# Patient Record
Sex: Female | Born: 1965
Health system: Southern US, Community
[De-identification: ages and names within clinical notes are randomized; demographics above are authoritative.]

## PROBLEM LIST (undated history)

## (undated) DIAGNOSIS — F32A Depression, unspecified: Secondary | ICD-10-CM

## (undated) DIAGNOSIS — L920 Granuloma annulare: Secondary | ICD-10-CM

## (undated) DIAGNOSIS — E119 Type 2 diabetes mellitus without complications: Secondary | ICD-10-CM

## (undated) DIAGNOSIS — F419 Anxiety disorder, unspecified: Secondary | ICD-10-CM

## (undated) DIAGNOSIS — M1990 Unspecified osteoarthritis, unspecified site: Secondary | ICD-10-CM

## (undated) DIAGNOSIS — N189 Chronic kidney disease, unspecified: Secondary | ICD-10-CM

## (undated) DIAGNOSIS — E785 Hyperlipidemia, unspecified: Secondary | ICD-10-CM

## (undated) HISTORY — DX: Hyperlipidemia, unspecified: E78.5

## (undated) HISTORY — DX: Depression, unspecified: F32.A

## (undated) HISTORY — DX: Chronic kidney disease, unspecified: N18.9

## (undated) HISTORY — PX: APPENDECTOMY: SHX54

## (undated) HISTORY — DX: Type 2 diabetes mellitus without complications: E11.9

## (undated) HISTORY — DX: Unspecified osteoarthritis, unspecified site: M19.90

## (undated) HISTORY — DX: Anxiety disorder, unspecified: F41.9

---

## 1999-07-02 ENCOUNTER — Encounter: Admission: RE | Admit: 1999-07-02 | Discharge: 1999-07-02 | Payer: Self-pay | Admitting: Cardiology

## 1999-07-02 ENCOUNTER — Encounter: Payer: Self-pay | Admitting: Cardiology

## 2003-03-18 ENCOUNTER — Ambulatory Visit: Admission: RE | Admit: 2003-03-18 | Discharge: 2003-03-18 | Payer: Self-pay | Admitting: Emergency Medicine

## 2014-09-28 ENCOUNTER — Other Ambulatory Visit (HOSPITAL_BASED_OUTPATIENT_CLINIC_OR_DEPARTMENT_OTHER): Payer: Self-pay | Admitting: Family Medicine

## 2014-09-28 ENCOUNTER — Ambulatory Visit (HOSPITAL_BASED_OUTPATIENT_CLINIC_OR_DEPARTMENT_OTHER): Payer: Self-pay

## 2014-09-28 DIAGNOSIS — Z09 Encounter for follow-up examination after completed treatment for conditions other than malignant neoplasm: Secondary | ICD-10-CM

## 2014-10-03 ENCOUNTER — Ambulatory Visit (HOSPITAL_BASED_OUTPATIENT_CLINIC_OR_DEPARTMENT_OTHER)
Admission: RE | Admit: 2014-10-03 | Discharge: 2014-10-03 | Disposition: A | Discharge status: Home / Self Care | Payer: Commercial Managed Care - PPO | Source: Ambulatory Visit | Attending: Family Medicine | Admitting: Family Medicine

## 2014-10-03 DIAGNOSIS — J188 Other pneumonia, unspecified organism: Secondary | ICD-10-CM | POA: Diagnosis present

## 2014-10-03 DIAGNOSIS — Z09 Encounter for follow-up examination after completed treatment for conditions other than malignant neoplasm: Secondary | ICD-10-CM

## 2014-10-15 ENCOUNTER — Emergency Department (HOSPITAL_BASED_OUTPATIENT_CLINIC_OR_DEPARTMENT_OTHER): Payer: Commercial Managed Care - PPO

## 2014-10-15 ENCOUNTER — Emergency Department (HOSPITAL_BASED_OUTPATIENT_CLINIC_OR_DEPARTMENT_OTHER)
Admission: EM | Admit: 2014-10-15 | Discharge: 2014-10-15 | Disposition: A | Discharge status: Home / Self Care | Payer: Commercial Managed Care - PPO | Attending: Emergency Medicine | Admitting: Emergency Medicine

## 2014-10-15 ENCOUNTER — Encounter (HOSPITAL_BASED_OUTPATIENT_CLINIC_OR_DEPARTMENT_OTHER): Payer: Self-pay | Admitting: Emergency Medicine

## 2014-10-15 DIAGNOSIS — Z3202 Encounter for pregnancy test, result negative: Secondary | ICD-10-CM | POA: Diagnosis not present

## 2014-10-15 DIAGNOSIS — R110 Nausea: Secondary | ICD-10-CM | POA: Insufficient documentation

## 2014-10-15 DIAGNOSIS — Z720 Tobacco use: Secondary | ICD-10-CM | POA: Insufficient documentation

## 2014-10-15 DIAGNOSIS — R079 Chest pain, unspecified: Secondary | ICD-10-CM | POA: Diagnosis present

## 2014-10-15 DIAGNOSIS — Z9049 Acquired absence of other specified parts of digestive tract: Secondary | ICD-10-CM | POA: Insufficient documentation

## 2014-10-15 DIAGNOSIS — R197 Diarrhea, unspecified: Secondary | ICD-10-CM | POA: Diagnosis not present

## 2014-10-15 DIAGNOSIS — R0602 Shortness of breath: Secondary | ICD-10-CM | POA: Insufficient documentation

## 2014-10-15 DIAGNOSIS — R42 Dizziness and giddiness: Secondary | ICD-10-CM | POA: Insufficient documentation

## 2014-10-15 LAB — CBC
HEMATOCRIT: 36.2 % (ref 36.0–46.0)
Hemoglobin: 12.3 g/dL (ref 12.0–15.0)
MCH: 30.8 pg (ref 26.0–34.0)
MCHC: 34 g/dL (ref 30.0–36.0)
MCV: 90.5 fL (ref 78.0–100.0)
Platelets: 248 10*3/uL (ref 150–400)
RBC: 4 MIL/uL (ref 3.87–5.11)
RDW: 13.2 % (ref 11.5–15.5)
WBC: 8.4 10*3/uL (ref 4.0–10.5)

## 2014-10-15 LAB — BASIC METABOLIC PANEL
Anion gap: 6 (ref 5–15)
BUN: 5 mg/dL — ABNORMAL LOW (ref 6–20)
CALCIUM: 8.6 mg/dL — AB (ref 8.9–10.3)
CHLORIDE: 105 mmol/L (ref 101–111)
CO2: 25 mmol/L (ref 22–32)
Creatinine, Ser: 0.84 mg/dL (ref 0.44–1.00)
Glucose, Bld: 155 mg/dL — ABNORMAL HIGH (ref 65–99)
POTASSIUM: 3 mmol/L — AB (ref 3.5–5.1)
SODIUM: 136 mmol/L (ref 135–145)

## 2014-10-15 LAB — TROPONIN I: Troponin I: <0.03 ng/mL (ref <0.031)

## 2014-10-15 LAB — PREGNANCY, URINE: Preg Test, Ur: NEGATIVE

## 2014-10-15 MED ORDER — POTASSIUM CHLORIDE ER 10 MEQ PO TBCR: 10.0000 meq | EXTENDED_RELEASE_TABLET | Freq: Every day | ORAL | Status: DC

## 2014-10-15 MED ORDER — IOHEXOL 350 MG/ML SOLN
100.0000 mL | Freq: Once | INTRAVENOUS | Status: AC | PRN
  Administered 2014-10-15: 100 mL via INTRAVENOUS

## 2014-10-15 MED ORDER — ONDANSETRON HCL 4 MG/2ML IJ SOLN
INTRAMUSCULAR | Status: AC
  Administered 2014-10-15: 4 mg
  Filled 2014-10-15: qty 2

## 2014-10-15 MED ORDER — FENTANYL CITRATE (PF) 100 MCG/2ML IJ SOLN
50.0000 ug | Freq: Once | INTRAMUSCULAR | Status: AC
  Administered 2014-10-15: 50 ug via INTRAVENOUS
  Filled 2014-10-15: qty 2

## 2014-10-15 MED ORDER — AZITHROMYCIN 250 MG PO TABS
500.0000 mg | ORAL_TABLET | Freq: Once | ORAL | Status: AC
  Administered 2014-10-15: 500 mg via ORAL
  Filled 2014-10-15: qty 2

## 2014-10-15 MED ORDER — AZITHROMYCIN 500 MG PO TABS: 500.0000 mg | ORAL_TABLET | Freq: Every day | ORAL | Status: DC

## 2014-10-15 MED ORDER — ONDANSETRON HCL 4 MG PO TABS: 4.0000 mg | ORAL_TABLET | Freq: Three times a day (TID) | ORAL | Status: DC | PRN

## 2014-10-15 MED ORDER — POTASSIUM CHLORIDE CRYS ER 20 MEQ PO TBCR
40.0000 meq | EXTENDED_RELEASE_TABLET | Freq: Once | ORAL | Status: AC
  Administered 2014-10-15: 40 meq via ORAL
  Filled 2014-10-15: qty 2

## 2014-10-15 MED ORDER — HYDROCODONE-ACETAMINOPHEN 5-325 MG PO TABS: 1.0000 | ORAL_TABLET | Freq: Four times a day (QID) | ORAL | Status: DC | PRN

## 2014-10-15 NOTE — ED Notes (Signed)
Pt in c/o chest pressure, SOB, near syncope, and arm pain since this am. Recent hospitalization and pneumonia. A+O and answering questions in triage.

## 2014-10-15 NOTE — ED Provider Notes (Signed)
CSN: [id number]     Arrival date & time 10/15/14  1618 History   First MD Initiated Contact with Patient 10/15/14 1643     Chief Complaint  Patient presents with  . Chest Pain  . Shortness of Breath   Karen Whitehead is a 49 yo F that reports a recent treatment for Legionella PNA. She was completed a course of ABX, steroids and an inhaler. Since last night she began experience left sided chest pain with heart fluttering.  The pain was 7/10 and reported as pressure.  She has never experienced this pain before. The pain is constant and not related to position, food, or exercise.  Denies any injury or trauma.  Is having some nausea, diarrhea and dizziness. No fever, constipation or vomiting.  She has no history of reflux, HTN, DM, hx of surgery or DVT.  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    (Consider location/radiation/quality/duration/timing/severity/associated sxs/prior Treatment) Patient is a 49 y.o. female presenting with chest pain and shortness of breath. The history is provided by the patient.  Chest Pain Pain location:  L chest Pain quality: pressure   Pain radiates to:  Does not radiate Pain severity:  Moderate Timing:  Constant Chronicity:  New Relieved by:  Rest Ineffective treatments:  None tried Associated symptoms: dizziness, nausea and shortness of breath   Associated symptoms: no cough, no fever, no numbness, not vomiting and no weakness   Shortness of Breath Associated symptoms: chest pain   Associated symptoms: no cough, no fever, no rash and no vomiting     History reviewed. No pertinent past medical history. Past Surgical History  Procedure Laterality Date  . Appendectomy     History reviewed. No pertinent family history. History  Substance Use Topics  . Smoking status: Current Every Day Smoker  . Smokeless tobacco: Never Used  . Alcohol Use: No   OB History    No data available     Review of Systems   Constitutional: Negative for fever.  Respiratory: Positive for shortness of breath. Negative for cough.   Cardiovascular: Positive for chest pain.  Gastrointestinal: Positive for nausea and diarrhea. Negative for vomiting and constipation.  Skin: Negative for rash.  Neurological: Positive for dizziness. Negative for weakness and numbness.      Allergies  Review of patient's allergies indicates no known allergies.  Home Medications   Prior to Admission medications   Medication Sig Start Date End Date Taking? Authorizing Provider  azithromycin (ZITHROMAX) 500 MG tablet Take 1 tablet (500 mg total) by mouth daily. 10/15/14   Rosemarie Ax, MD  HYDROcodone-acetaminophen (NORCO) 5-325 MG per tablet Take 1 tablet by mouth every 6 (six) hours as needed for moderate pain. 10/15/14   Rosemarie Ax, MD  ondansetron (ZOFRAN) 4 MG tablet Take 1 tablet (4 mg total) by mouth every 8 (eight) hours as needed for nausea or vomiting. 10/15/14   Rosemarie Ax, MD   BP 125/77 mmHg  Pulse 62  Resp 15  SpO2 98%  LMP 09/19/2014 Physical Exam  Constitutional: She is oriented to person, place, and time. She appears well-developed and well-nourished.  HENT:  Head: Normocephalic and atraumatic.  Eyes: Conjunctivae and EOM are normal.  Neck: Normal range of motion. Neck supple.  Pulmonary/Chest: She exhibits tenderness.  Pain improved when she has her left shoulder in full  abduction   Musculoskeletal: Normal range of motion.  Neurological: She is oriented to person,  place, and time.  Skin: Skin is warm. No rash noted.    ED Course  Procedures (including critical care time) Labs Review Labs Reviewed  BASIC METABOLIC PANEL - Abnormal; Notable for the following:    Potassium 3.0 (*)    Glucose, Bld 155 (*)    BUN 5 (*)    Calcium 8.6 (*)    All other components within normal limits  CBC  TROPONIN I  PREGNANCY, URINE    Imaging Review Dg Chest 2 View  10/15/2014   CLINICAL DATA:   Recurrent left chest pain with difficulty breathing. Pneumonia diagnosed 3 weeks ago and treated with antibiotics. Ex-smoker. Subsequent encounter.  EXAM: CHEST  2 VIEW  COMPARISON:  Chest radiographs 10/03/2014.  FINDINGS: There has been partial but incomplete healing of the previously demonstrated left suprahilar airspace disease. The lower lobe appears clear. There is stable mild volume loss in the left hemithorax. The right lung is clear. The heart size and mediastinal contours are stable without definite adenopathy. There is no pleural effusion.  IMPRESSION: Partial clearing of left upper lobe pneumonia demonstrated 12 days prior. This improvement is reassuring. Followup PA and lateral chest X-ray is recommended in 3-4 weeks following completion of antibiotic therapy to ensure resolution and exclude underlying malignancy.   Electronically Signed   By: Richardean Sale M.D.   On: 10/15/2014 17:12   Ct Angio Chest Pe W/cm &/or Wo Cm  10/15/2014   CLINICAL DATA:  Chest pressure.  Near syncope.  Arm pain.  EXAM: CT ANGIOGRAPHY CHEST WITH CONTRAST  TECHNIQUE: Multidetector CT imaging of the chest was performed using the standard protocol during bolus administration of intravenous contrast. Multiplanar CT image reconstructions and MIPs were obtained to evaluate the vascular anatomy.  CONTRAST:  158mL OMNIPAQUE IOHEXOL 350 MG/ML SOLN  COMPARISON:  10/15/2014, 10/03/2014  FINDINGS: There is adequate opacification of the pulmonary arteries. There is no pulmonary embolus. The main pulmonary artery, right main pulmonary artery and left main pulmonary arteries are normal in size. The heart size is normal. There is no pericardial effusion.  There is left upper lobe airspace disease. There is no centrally obstructing mass. Mild reticular nodular interstitial disease in the posterior left upper lobe. There is mild right middle lobe atelectasis. There is no pleural effusion or pneumothorax.  There is no axillary, hilar, or  mediastinal adenopathy.  There is no lytic or blastic osseous lesion.  The visualized portions of the upper abdomen are unremarkable.  Review of the MIP images confirms the above findings.  IMPRESSION: 1. No evidence of pulmonary embolus. 2. Left upper lobe airspace disease which may reflect atelectasis versus persistent pneumonia. There is no centrally obstructing mass.   Electronically Signed   By: Kathreen Devoid   On: 10/15/2014 19:28     EKG Interpretation   Date/Time:  Sunday October 15 2014 16:28:26 EDT Ventricular Rate:  71 PR Interval:  144 QRS Duration: 82 QT Interval:  398 QTC Calculation: 432 R Axis:   62 Text Interpretation:  Normal sinus rhythm Normal ECG Confirmed by BEATON   MD, ROBERT ([id number]) on 10/15/2014 4:29:35 PM      MDM   Final diagnoses:  Chest pain, unspecified chest pain type   Ms. Mandarino is a 49 yo F p/w with left sided chest pain/pressure. Patient having hypokalemia and will replenish with 40 mEq Kdur. Wells 0 and PERC all no but given her hx of smoking, working as a Administrator and concern for malignancy on her  CXR will CTA.   CTA neg for mass or PE. Chest pain could be associated with PNA. Give azithromycin 500 mg here and another 4 days worth. Give zofran for nausea and norco #8 tablets for pain PRN. Patient agreeable with plan and discharge.   Rosemarie Ax, MD PGY-2, [facility] 10/15/2014, 7:59 PM     Rosemarie Ax, MD 10/15/14 Patrecia Pour  Leonard Schwartz, MD 10/15/14 2004

## 2014-10-15 NOTE — Discharge Instructions (Signed)

## 2016-02-19 ENCOUNTER — Emergency Department (HOSPITAL_COMMUNITY)
Admission: EM | Admit: 2016-02-19 | Discharge: 2016-02-19 | Disposition: A | Discharge status: Home / Self Care | Payer: Commercial Managed Care - PPO | Attending: Emergency Medicine | Admitting: Emergency Medicine

## 2016-02-19 ENCOUNTER — Emergency Department (HOSPITAL_COMMUNITY): Payer: Commercial Managed Care - PPO

## 2016-02-19 ENCOUNTER — Encounter (HOSPITAL_COMMUNITY): Payer: Self-pay | Admitting: Emergency Medicine

## 2016-02-19 DIAGNOSIS — R0789 Other chest pain: Secondary | ICD-10-CM

## 2016-02-19 DIAGNOSIS — F17210 Nicotine dependence, cigarettes, uncomplicated: Secondary | ICD-10-CM | POA: Insufficient documentation

## 2016-02-19 LAB — URINE MICROSCOPIC-ADD ON

## 2016-02-19 LAB — CBC
HCT: 43.4 % (ref 36.0–46.0)
Hemoglobin: 15.1 g/dL — ABNORMAL HIGH (ref 12.0–15.0)
MCH: 30.9 pg (ref 26.0–34.0)
MCHC: 34.8 g/dL (ref 30.0–36.0)
MCV: 88.8 fL (ref 78.0–100.0)
PLATELETS: 306 10*3/uL (ref 150–400)
RBC: 4.89 MIL/uL (ref 3.87–5.11)
RDW: 13 % (ref 11.5–15.5)
WBC: 8 10*3/uL (ref 4.0–10.5)

## 2016-02-19 LAB — BASIC METABOLIC PANEL
Anion gap: 8 (ref 5–15)
BUN: 7 mg/dL (ref 6–20)
CALCIUM: 9.8 mg/dL (ref 8.9–10.3)
CO2: 25 mmol/L (ref 22–32)
CREATININE: 0.87 mg/dL (ref 0.44–1.00)
Chloride: 106 mmol/L (ref 101–111)
GFR calc non Af Amer: >60 mL/min (ref >60)
Glucose, Bld: 111 mg/dL — ABNORMAL HIGH (ref 65–99)
Potassium: 4 mmol/L (ref 3.5–5.1)
SODIUM: 139 mmol/L (ref 135–145)

## 2016-02-19 LAB — I-STAT TROPONIN, ED
TROPONIN I, POC: 0 ng/mL (ref 0.00–0.08)
Troponin i, poc: 0 ng/mL (ref 0.00–0.08)

## 2016-02-19 LAB — URINALYSIS, ROUTINE W REFLEX MICROSCOPIC
BILIRUBIN URINE: NEGATIVE
Glucose, UA: NEGATIVE mg/dL
Ketones, ur: NEGATIVE mg/dL
Leukocytes, UA: NEGATIVE
Nitrite: NEGATIVE
PH: 5.5 (ref 5.0–8.0)
Protein, ur: NEGATIVE mg/dL
Specific Gravity, Urine: 1.014 (ref 1.005–1.030)

## 2016-02-19 NOTE — ED Provider Notes (Signed)
[facility] DEPT Provider Note   CSN: [id number] Arrival date & time: 02/19/16  1021     History   Chief Complaint Chief Complaint  Patient presents with  . Chest Pain    HPI Karen Whitehead is a 50 y.o. female.  The history is provided by the patient. No language interpreter was used.  Chest Pain   This is a recurrent problem. The current episode started more than 1 week ago. The problem occurs every several days. The problem has not changed since onset.The pain is associated with exertion. The pain is present in the lateral region. The quality of the pain is described as exertional and pleuritic. The pain radiates to the mid back. The symptoms are aggravated by exertion. Associated symptoms include back pain, dizziness and shortness of breath. Pertinent negatives include no abdominal pain and no fever. She has tried rest for the symptoms. The treatment provided moderate relief.    History reviewed. No pertinent past medical history.  There are no active problems to display for this patient.   Past Surgical History:  Procedure Laterality Date  . APPENDECTOMY      OB History    No data available       Home Medications    Prior to Admission medications   Medication Sig Start Date End Date Taking? Authorizing Provider  azithromycin (ZITHROMAX) 500 MG tablet Take 1 tablet (500 mg total) by mouth daily. 10/15/14   Rosemarie Ax, MD  HYDROcodone-acetaminophen (NORCO) 5-325 MG per tablet Take 1 tablet by mouth every 6 (six) hours as needed for moderate pain. 10/15/14   Rosemarie Ax, MD  ondansetron (ZOFRAN) 4 MG tablet Take 1 tablet (4 mg total) by mouth every 8 (eight) hours as needed for nausea or vomiting. 10/15/14   Rosemarie Ax, MD  potassium chloride (K-DUR) 10 MEQ tablet Take 1 tablet (10 mEq total) by mouth daily. 10/15/14   Rosemarie Ax, MD    Family History No family history on file.  Social History Social History  Substance Use Topics  .  Smoking status: Current Every Day Smoker    Packs/day: 0.50    Types: Cigarettes  . Smokeless tobacco: Never Used  . Alcohol use No     Allergies   Review of patient's allergies indicates no known allergies.   Review of Systems Review of Systems  Constitutional: Negative for fever.  Respiratory: Positive for shortness of breath.   Cardiovascular: Positive for chest pain. Negative for leg swelling.  Gastrointestinal: Negative for abdominal pain.  Musculoskeletal: Positive for back pain.  Neurological: Positive for dizziness.  All other systems reviewed and are negative.    Physical Exam Updated Vital Signs BP (!) 137/102 (BP Location: Left Arm)   Pulse 88   Temp 97.2 F (36.2 C) (Oral)   Resp 18   Ht 5\' 4"  (1.626 m)   Wt 90.7 kg   LMP 01/18/2016   SpO2 99%   BMI 34.33 kg/m   Physical Exam  Constitutional: She is oriented to person, place, and time. She appears well-developed and well-nourished.  HENT:  Head: Normocephalic.  Eyes: Conjunctivae are normal.  Neck: Neck supple.  Cardiovascular: Normal rate, regular rhythm and intact distal pulses.   Pulmonary/Chest: Effort normal and breath sounds normal.  Abdominal: Soft. Bowel sounds are normal. She exhibits no distension. There is tenderness. There is CVA tenderness.  Musculoskeletal: She exhibits no edema.  Neurological: She is alert and oriented to person, place, and time.  Skin: Skin is warm and dry.  Psychiatric: She has a normal mood and affect.  Nursing note and vitals reviewed.    ED Treatments / Results  Labs (all labs ordered are listed, but only abnormal results are displayed) Labs Reviewed  BASIC METABOLIC PANEL - Abnormal; Notable for the following:       Result Value   Glucose, Bld 111 (*)    All other components within normal limits  CBC - Abnormal; Notable for the following:    Hemoglobin 15.1 (*)    All other components within normal limits  I-STAT TROPOININ, ED    EKG  EKG  Interpretation None       Radiology Dg Chest 2 View  Result Date: 02/19/2016 CLINICAL DATA:  Left chest pain, some dizziness EXAM: CHEST  2 VIEW COMPARISON:  CT angio chest of 10/15/2014 and chest x-ray of the same date FINDINGS: The left upper lobe airspace disease has cleared. No infiltrate or effusion is currently seen. Mediastinal and hilar contours are unremarkable. The heart is within normal limits in size. No bony abnormality is seen. IMPRESSION: Clearing of left upper lobe pneumonia.  No definite active process. Electronically Signed   By: Ivar Drape M.D.   On: 02/19/2016 11:05    Procedures Procedures (including critical care time)  Medications Ordered in ED Medications - No data to display   Initial Impression / Assessment and Plan / ED Course  I have reviewed the triage vital signs and the nursing notes.  Pertinent labs & imaging results that were available during my care of the patient were reviewed by me and considered in my medical decision making (see chart for details).  Clinical Course  Patient with several month history of intermittent chest pain.  Increased pain today while in ED with a family member that passed away. No ischemic changes on ECG. Negative delta troponin.  Discussed with Dr. Alvino Chapel.  Patient will be discharged home with follow-up by PCP. Return precautions discussed.    Final Clinical Impressions(s) / ED Diagnoses   Final diagnoses:  Atypical chest pain    New Prescriptions New Prescriptions   No medications on file     [name], NP 02/19/16 [place], MD 02/21/16 [phone]

## 2016-02-19 NOTE — ED Notes (Signed)
Theodosia Paling NP, spoke with patient about results and and papers reviewed with patient. Denies questions and verbalizes intent to follow up

## 2016-02-19 NOTE — ED Triage Notes (Signed)
Patient was here to see her father as a patient.  Patient states he passed "and I think it has to do with that".   Patient states L chest pain that goes into L arm.   Patient states did feel lightheaded and had some nausea.  Patient denies other symptoms.

## 2016-02-19 NOTE — ED Notes (Signed)
Pt is in consult room with family -- father passed away-- is in rm Trauma C

## 2016-12-19 MED FILL — METAXALONE 800 MG TABLET: 800 | 7 days supply | Qty: 21 | Fill #0

## 2017-12-07 ENCOUNTER — Ambulatory Visit: Payer: Self-pay | Admitting: Family Medicine

## 2018-11-10 ENCOUNTER — Encounter (HOSPITAL_COMMUNITY): Payer: Self-pay

## 2018-11-10 ENCOUNTER — Ambulatory Visit (HOSPITAL_COMMUNITY)
Admission: EM | Admit: 2018-11-10 | Discharge: 2018-11-10 | Disposition: A | Discharge status: Home / Self Care | Payer: Commercial Managed Care - PPO | Attending: Emergency Medicine | Admitting: Emergency Medicine

## 2018-11-10 ENCOUNTER — Other Ambulatory Visit: Payer: Self-pay

## 2018-11-10 DIAGNOSIS — R823 Hemoglobinuria: Secondary | ICD-10-CM

## 2018-11-10 DIAGNOSIS — B354 Tinea corporis: Secondary | ICD-10-CM | POA: Insufficient documentation

## 2018-11-10 LAB — CBC WITH DIFFERENTIAL/PLATELET
Abs Immature Granulocytes: 0.04 10*3/uL (ref 0.00–0.07)
Basophils Absolute: 0.1 10*3/uL (ref 0.0–0.1)
Basophils Relative: 1 %
Eosinophils Absolute: 0.3 10*3/uL (ref 0.0–0.5)
Eosinophils Relative: 3 %
HCT: 44 % (ref 36.0–46.0)
Hemoglobin: 15.1 g/dL — ABNORMAL HIGH (ref 12.0–15.0)
Immature Granulocytes: 0 %
Lymphocytes Relative: 32 %
Lymphs Abs: 3 10*3/uL (ref 0.7–4.0)
MCH: 31.1 pg (ref 26.0–34.0)
MCHC: 34.3 g/dL (ref 30.0–36.0)
MCV: 90.7 fL (ref 80.0–100.0)
Monocytes Absolute: 0.6 10*3/uL (ref 0.1–1.0)
Monocytes Relative: 7 %
Neutro Abs: 5.5 10*3/uL (ref 1.7–7.7)
Neutrophils Relative %: 57 %
Platelets: 304 10*3/uL (ref 150–400)
RBC: 4.85 MIL/uL (ref 3.87–5.11)
RDW: 12.5 % (ref 11.5–15.5)
WBC: 9.6 10*3/uL (ref 4.0–10.5)
nRBC: 0 % (ref 0.0–0.2)

## 2018-11-10 LAB — POCT URINALYSIS DIP (DEVICE)
Bilirubin Urine: NEGATIVE
Glucose, UA: NEGATIVE mg/dL
Ketones, ur: NEGATIVE mg/dL
Leukocytes,Ua: NEGATIVE
Nitrite: NEGATIVE
Protein, ur: NEGATIVE mg/dL
Specific Gravity, Urine: 1.025 (ref 1.005–1.030)
Urobilinogen, UA: 0.2 mg/dL (ref 0.0–1.0)
pH: 5.5 (ref 5.0–8.0)

## 2018-11-10 MED ORDER — FLUCONAZOLE 200 MG PO TABS: 200.0000 mg | ORAL_TABLET | ORAL | 0 refills | Status: AC

## 2018-11-10 NOTE — ED Triage Notes (Signed)
Pt has a rash on her face , neck and hands. Pt states she has been treating her rash for 2 months now. Pt states she has been having Panic Attacks for 3 months and she depressed.

## 2018-11-10 NOTE — Discharge Instructions (Signed)
Important to follow-up with PCP regarding blood pressure management, blood in urine, rash. Take Diflucan once weekly for the next month. Return if you develop fever, muscle aches, chest pain, severe abdominal pain.

## 2018-11-10 NOTE — ED Notes (Signed)
Patient still unable to void.

## 2018-11-10 NOTE — ED Provider Notes (Signed)
[facility]    CSN: [id number] Arrival date & time: 11/10/18  1130     History   Chief Complaint Chief Complaint  Patient presents with  . Rash  . Depression    HPI Karen Whitehead is a 53 y.o. female with history of legionnaires disease, dengue fever, presenting for acute concern of rash.  Patient states that this popped up her left hand first about 2 months ago.  States now it spread to her chest, left side of her neck and scalp.  States that it itches sometimes, no known bug bites, environmental exposures.  Patient denies history of HIV, immunocompromise conditions such as diabetes or lupus, though "I have not been tested for HIV in a long time ".  Patient not currently sexually active.  Denies fever, chills, arthralgias, myalgias, decreased appetite, change in weight.  Does state that she has noticed her urine change in color for the last 2 months.  Denies burning with urination, hematuria, urinary frequency/urgency.  Abdominal, pelvic, back pain. Patient also endorsing increased stress and anxiety second to coronavirus epidemic.  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  Patient does not currently have PCP.  Patient denies history of SI/HI, denies SI/HI currently.  Pressure is elevated in office: Patient denies headache, change in vision, chest pain, shortness of breath, abdominal pain, lower extremity edema, claudication.  Patient has never been on blood pressure medications before.    History reviewed. No pertinent past medical history.  There are no active problems to display for this patient.   Past Surgical History:  Procedure Laterality Date  . APPENDECTOMY      OB History   No obstetric history on file.      Home Medications    Prior to Admission medications   Medication Sig Start Date End Date Taking? Authorizing Provider  fluconazole (DIFLUCAN) 200 MG tablet Take 1 tablet  (200 mg total) by mouth once a week for 4 doses. 11/10/18 12/02/18  Hall-Potvin, [place], PA-C  potassium chloride (K-DUR) 10 MEQ tablet Take 1 tablet (10 mEq total) by mouth daily. Patient not taking: Reported on 02/19/2016 10/15/14 11/10/18  Rosemarie Ax, MD    Family History History reviewed. No pertinent family history.  Social History Social History   Tobacco Use  . Smoking status: Current Every Day Smoker    Packs/day: 0.50    Types: Cigarettes  . Smokeless tobacco: Never Used  Substance Use Topics  . Alcohol use: No  . Drug use: No     Allergies   Patient has no known allergies.   Review of Systems As per HPI   Physical Exam Triage Vital Signs ED Triage Vitals  Enc Vitals Group     BP 11/10/18 1156 (!) 154/105     Pulse Rate 11/10/18 1156 85     Resp 11/10/18 1156 16     Temp 11/10/18 1156 99.1 F (37.3 C)     Temp Source 11/10/18 1156 Oral     SpO2 11/10/18 1156 98 %     Weight 11/10/18 1157 215 lb (97.5 kg)     Height --      Head Circumference --      Peak Flow --      Pain Score 11/10/18 1157 7     Pain Loc --      Pain Edu? --      Excl. in [place]? --  No data found.  Updated Vital Signs BP (!) 154/105 (BP Location: Right Arm)   Pulse 85   Temp 99.1 F (37.3 C) (Oral)   Resp 16   Wt 215 lb (97.5 kg)   SpO2 98%   BMI 36.90 kg/m   Visual Acuity Right Eye Distance:   Left Eye Distance:   Bilateral Distance:    Right Eye Near:   Left Eye Near:    Bilateral Near:     Physical Exam Constitutional:      General: She is not in acute distress. HENT:     Head: Normocephalic and atraumatic.  Eyes:     General: No scleral icterus.    Pupils: Pupils are equal, round, and reactive to light.  Cardiovascular:     Rate and Rhythm: Normal rate.  Pulmonary:     Effort: Pulmonary effort is normal.  Skin:    Comments: Circumferential erythematous lesions with raised borders on chest, left dorsal aspect of hand, left neck.  Nontender to  palpation, no warmth.  Neurological:     Mental Status: She is alert and oriented to person, place, and time.      UC Treatments / Results  Labs (all labs ordered are listed, but only abnormal results are displayed) Labs Reviewed  POCT URINALYSIS DIP (DEVICE) - Abnormal; Notable for the following components:      Result Value   Hgb urine dipstick MODERATE (*)    All other components within normal limits  CBC WITH DIFFERENTIAL/PLATELET  HIV ANTIBODY (ROUTINE TESTING W REFLEX)    EKG   Radiology No results found.  Procedures Procedures (including critical care time)  Medications Ordered in UC Medications - No data to display  Initial Impression / Assessment and Plan / UC Course  I have reviewed the triage vital signs and the nursing notes.  Pertinent labs & imaging results that were available during my care of the patient were reviewed by me and considered in my medical decision making (see chart for details).     53 year old female presenting for rash and darkened urine.  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  Patient hypertensive, asymptomatic will also follow-up with PCP for further management.  Will give Diflucan once weekly x1 month for tinea corporis.  HIV, CBC pending, will call patient if abnormal.  Return precautions discussed, patient verbalized understanding. Final Clinical Impressions(s) / UC Diagnoses   Final diagnoses:  Tinea corporis  Hemoglobinuria     Discharge Instructions     Important to follow-up with PCP regarding blood pressure management, blood in urine, rash. Take Diflucan once weekly for the next month. Return if you develop fever, muscle aches, chest pain, severe abdominal pain.    ED Prescriptions    Medication Sig Dispense Auth. Provider   fluconazole (DIFLUCAN) 200 MG tablet Take 1 tablet (200 mg total) by  mouth once a week for 4 doses. 4 tablet Hall-Potvin, [place], PA-C     Controlled Substance Prescriptions [place] Controlled Substance Registry consulted? Not Applicable   Quincy Sheehan, [place] 11/10/18 1326

## 2018-11-11 LAB — HIV ANTIBODY (ROUTINE TESTING W REFLEX): HIV Screen 4th Generation wRfx: NONREACTIVE

## 2018-11-12 ENCOUNTER — Telehealth (HOSPITAL_COMMUNITY): Payer: Self-pay | Admitting: Emergency Medicine

## 2018-11-12 NOTE — Telephone Encounter (Signed)
Normal labs. Attempted to reach patient. No answer at this time.

## 2018-12-21 MED FILL — FLUTICASONE PROP 0.05% CRM: 0.05 | 30 days supply | Qty: 60 | Fill #0

## 2018-12-29 MED FILL — FLUTICASONE PROP 0.05% CRM: 0.05 | 30 days supply | Qty: 60 | Fill #0

## 2019-01-17 MED FILL — FLUTICASONE PROP 0.05% CRM: 0.05 | 30 days supply | Qty: 60 | Fill #1

## 2019-10-01 ENCOUNTER — Ambulatory Visit: Payer: Self-pay | Attending: Family Medicine

## 2019-10-16 ENCOUNTER — Emergency Department (HOSPITAL_COMMUNITY): Payer: Self-pay

## 2019-10-16 ENCOUNTER — Encounter (HOSPITAL_COMMUNITY): Payer: Self-pay | Admitting: Emergency Medicine

## 2019-10-16 ENCOUNTER — Other Ambulatory Visit: Payer: Self-pay

## 2019-10-16 ENCOUNTER — Emergency Department (HOSPITAL_COMMUNITY)
Admission: EM | Admit: 2019-10-16 | Discharge: 2019-10-16 | Disposition: A | Discharge status: Home / Self Care | Payer: Self-pay | Attending: Emergency Medicine | Admitting: Emergency Medicine

## 2019-10-16 DIAGNOSIS — M2550 Pain in unspecified joint: Secondary | ICD-10-CM | POA: Insufficient documentation

## 2019-10-16 DIAGNOSIS — R0600 Dyspnea, unspecified: Secondary | ICD-10-CM | POA: Insufficient documentation

## 2019-10-16 DIAGNOSIS — R0609 Other forms of dyspnea: Secondary | ICD-10-CM

## 2019-10-16 DIAGNOSIS — F17210 Nicotine dependence, cigarettes, uncomplicated: Secondary | ICD-10-CM | POA: Insufficient documentation

## 2019-10-16 DIAGNOSIS — M2559 Pain in other specified joint: Secondary | ICD-10-CM

## 2019-10-16 HISTORY — DX: Granuloma annulare: L92.0

## 2019-10-16 LAB — I-STAT VENOUS BLOOD GAS, ED
Acid-Base Excess: 3 mmol/L — ABNORMAL HIGH (ref 0.0–2.0)
Bicarbonate: 27.8 mmol/L (ref 20.0–28.0)
Calcium, Ion: 1.1 mmol/L — ABNORMAL LOW (ref 1.15–1.40)
HCT: 46 % (ref 36.0–46.0)
Hemoglobin: 15.6 g/dL — ABNORMAL HIGH (ref 12.0–15.0)
O2 Saturation: 36 %
Potassium: 3.4 mmol/L — ABNORMAL LOW (ref 3.5–5.1)
Sodium: 139 mmol/L (ref 135–145)
TCO2: 29 mmol/L (ref 22–32)
pCO2, Ven: 40.9 mmHg — ABNORMAL LOW (ref 44.0–60.0)
pH, Ven: 7.44 — ABNORMAL HIGH (ref 7.250–7.430)
pO2, Ven: 21 mmHg — CL (ref 32.0–45.0)

## 2019-10-16 LAB — CBC
HCT: 44.6 % (ref 36.0–46.0)
Hemoglobin: 15.2 g/dL — ABNORMAL HIGH (ref 12.0–15.0)
MCH: 31.5 pg (ref 26.0–34.0)
MCHC: 34.1 g/dL (ref 30.0–36.0)
MCV: 92.5 fL (ref 80.0–100.0)
Platelets: 289 10*3/uL (ref 150–400)
RBC: 4.82 MIL/uL (ref 3.87–5.11)
RDW: 12.6 % (ref 11.5–15.5)
WBC: 10.5 10*3/uL (ref 4.0–10.5)
nRBC: 0 % (ref 0.0–0.2)

## 2019-10-16 LAB — BASIC METABOLIC PANEL
Anion gap: 14 (ref 5–15)
BUN: 7 mg/dL (ref 6–20)
CO2: 24 mmol/L (ref 22–32)
Calcium: 9.8 mg/dL (ref 8.9–10.3)
Chloride: 100 mmol/L (ref 98–111)
Creatinine, Ser: 0.87 mg/dL (ref 0.44–1.00)
GFR calc Af Amer: >60 mL/min (ref >60)
GFR calc non Af Amer: >60 mL/min (ref >60)
Glucose, Bld: 153 mg/dL — ABNORMAL HIGH (ref 70–99)
Potassium: 3.5 mmol/L (ref 3.5–5.1)
Sodium: 138 mmol/L (ref 135–145)

## 2019-10-16 LAB — I-STAT BETA HCG BLOOD, ED (MC, WL, AP ONLY): I-stat hCG, quantitative: <5 m[IU]/mL (ref <5)

## 2019-10-16 LAB — SEDIMENTATION RATE: Sed Rate: 12 mm/hr (ref 0–22)

## 2019-10-16 LAB — TROPONIN I (HIGH SENSITIVITY)
Troponin I (High Sensitivity): 3 ng/L (ref <18)
Troponin I (High Sensitivity): 2 ng/L (ref <18)

## 2019-10-16 LAB — D-DIMER, QUANTITATIVE: D-Dimer, Quant: 0.34 ug/mL-FEU (ref 0.00–0.50)

## 2019-10-16 MED ORDER — PREDNISONE 20 MG PO TABS
60.0000 mg | ORAL_TABLET | Freq: Once | ORAL | Status: AC
  Administered 2019-10-16: 60 mg via ORAL
  Filled 2019-10-16: qty 3

## 2019-10-16 MED ORDER — AEROCHAMBER PLUS FLO-VU LARGE MISC: 1.0000 | Freq: Once | Status: DC

## 2019-10-16 MED ORDER — ALBUTEROL SULFATE HFA 108 (90 BASE) MCG/ACT IN AERS
2.0000 | INHALATION_SPRAY | Freq: Once | RESPIRATORY_TRACT | Status: AC
  Administered 2019-10-16: 2 via RESPIRATORY_TRACT
  Filled 2019-10-16: qty 6.7

## 2019-10-16 MED ORDER — PREDNISONE 20 MG PO TABS: 40.0000 mg | ORAL_TABLET | Freq: Every day | ORAL | 0 refills | Status: DC

## 2019-10-16 MED ORDER — SODIUM CHLORIDE 0.9% FLUSH: 3.0000 mL | Freq: Once | INTRAVENOUS | Status: DC

## 2019-10-16 NOTE — Discharge Instructions (Signed)
Try the prednisone to see if that will help with your joint pain.  You can continue to use Tylenol as well.  You can use the inhaler every 4-6 hours 1 to 2 puffs with the spacer as needed for tightness or shortness of breath.

## 2019-10-16 NOTE — ED Provider Notes (Signed)
[facility] EMERGENCY DEPARTMENT Provider Note   CSN: [id number] Arrival date & time: 10/16/19  1431     History Chief Complaint  Patient presents with  . Chest Pain  . Joint Pain  . Headache    Karen Whitehead is a 54 y.o. female.  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  Patient reports the symptoms she is having today have been ongoing for at least a month and may be more.  She reports that almost on a daily basis she gets a feeling of fluttering in her chest which causes her to have chest tightness and shortness of breath.  She is also noticed dyspnea on exertion over the last 1 to 2 months that is not significantly changed.  She reports when she tries to sleep at night she has more trouble and has difficulty sleeping.  She states when she does finally get to sleep when she wakes up she has a severe headache that then improves as the day goes on.  She does smoke daily but states only a few cigarettes but has no prior history of asthma.  She has had an ongoing cough that is nonproductive but can cause posttussive emesis.  She has not had fever but reports that she is supposed to be receiving expensive medication for her granuloma annulare which she cannot afford.  She is also over the last 1 to 2 months started to notice more joint pain in multiple different sites.  She has not had any notable joint swelling or redness.  Patient currently does not have a PCP because she lost her insurance.  She is trying to find a doctor and has applied for an orange card but has not heard anything.  No prior history of cardiac or lung conditions.  She takes no over-the-counter or prescription medications at this time.  No recent travel and no family history of DVT or PE  The history is provided by the patient.  Chest Pain Chest pain location: tightness throughout the chest and worse when she feels  palpitations. Pain quality: tightness   Pain radiates to:  Does not radiate Pain severity:  Mild Onset quality:  Gradual Duration:  1 month Timing:  Intermittent Progression:  Unchanged Chronicity:  Recurrent Associated symptoms: cough, headache, nausea, palpitations and shortness of breath   Associated symptoms: no abdominal pain, no fever, no lower extremity edema, no near-syncope and no orthopnea   Risk factors comment:  Hx of granuloma annulare Headache Associated symptoms: cough and nausea   Associated symptoms: no abdominal pain, no fever and no near-syncope        Past Medical History:  Diagnosis Date  . Granuloma annulare     There are no problems to display for this patient.   Past Surgical History:  Procedure Laterality Date  . APPENDECTOMY       OB History   No obstetric history on file.     No family history on file.  Social History   Tobacco Use  . Smoking status: Current Every Day Smoker    Packs/day: 0.50    Types: Cigarettes  . Smokeless tobacco: Never Used  Substance Use Topics  . Alcohol use: No  . Drug use: No    Home Medications Prior to Admission medications   Medication Sig Start Date End Date Taking? Authorizing Provider  potassium chloride (K-DUR) 10 MEQ tablet Take 1 tablet (10 mEq total) by  mouth daily. Patient not taking: Reported on 02/19/2016 10/15/14 11/10/18  Rosemarie Ax, MD    Allergies    Patient has no known allergies.  Review of Systems   Review of Systems  Constitutional: Negative for fever.  Respiratory: Positive for cough and shortness of breath.   Cardiovascular: Positive for chest pain and palpitations. Negative for orthopnea and near-syncope.  Gastrointestinal: Positive for nausea. Negative for abdominal pain.  Neurological: Positive for headaches.  All other systems reviewed and are negative.   Physical Exam Updated Vital Signs BP (!) 143/94 (BP Location: Right Arm)   Pulse 90   Temp 98 F (36.7  C) (Oral)   Resp 18   SpO2 100%   Physical Exam Vitals and nursing note reviewed.  Constitutional:      General: She is not in acute distress.    Appearance: She is well-developed.  HENT:     Head: Normocephalic and atraumatic.     Mouth/Throat:     Mouth: Mucous membranes are moist.  Eyes:     Pupils: Pupils are equal, round, and reactive to light.  Cardiovascular:     Rate and Rhythm: Normal rate and regular rhythm.     Heart sounds: Normal heart sounds. No murmur heard.  No friction rub.  Pulmonary:     Effort: Pulmonary effort is normal.     Breath sounds: Normal breath sounds. No wheezing or rales.       Comments: Slight pursed lip breathing on exam Abdominal:     General: Bowel sounds are normal. There is no distension.     Palpations: Abdomen is soft.     Tenderness: There is no abdominal tenderness. There is no guarding or rebound.  Musculoskeletal:        General: No tenderness. Normal range of motion.     Right lower leg: No edema.     Left lower leg: No edema.     Comments: No edema  Skin:    General: Skin is warm and dry.     Findings: Rash present.     Comments: Circular raised red lesions present most closely grouped in the upper extremities but also on the chest and sparsely on the legs  Neurological:     General: No focal deficit present.     Mental Status: She is alert and oriented to person, place, and time. Mental status is at baseline.     Cranial Nerves: No cranial nerve deficit.  Psychiatric:        Mood and Affect: Mood is anxious.        Behavior: Behavior normal.        Thought Content: Thought content normal.     ED Results / Procedures / Treatments   Labs (all labs ordered are listed, but only abnormal results are displayed) Labs Reviewed  BASIC METABOLIC PANEL - Abnormal; Notable for the following components:      Result Value   Glucose, Bld 153 (*)    All other components within normal limits  CBC - Abnormal; Notable for the  following components:   Hemoglobin 15.2 (*)    All other components within normal limits  I-STAT VENOUS BLOOD GAS, ED - Abnormal; Notable for the following components:   pH, Ven 7.440 (*)    pCO2, Ven 40.9 (*)    pO2, Ven 21.0 (*)    Acid-Base Excess 3.0 (*)    Potassium 3.4 (*)    Calcium, Ion 1.10 (*)    Hemoglobin  15.6 (*)    All other components within normal limits  D-DIMER, QUANTITATIVE (NOT AT [facility])  SEDIMENTATION RATE  I-STAT BETA HCG BLOOD, ED ([facility], [facility], [facility] ONLY)  TROPONIN I (HIGH SENSITIVITY)  TROPONIN I (HIGH SENSITIVITY)    EKG EKG Interpretation  Date/Time:  Sunday October 16 2019 15:00:48 EDT Ventricular Rate:  89 PR Interval:  128 QRS Duration: 62 QT Interval:  344 QTC Calculation: 418 R Axis:   63 Text Interpretation: Normal sinus rhythm Low voltage QRS T wave abnormality, consider inferior ischemia No significant change since last tracing Confirmed by Blanchie Dessert [id number]) on 10/16/2019 4:02:36 PM   Radiology DG Chest 2 View  Result Date: 10/16/2019 CLINICAL DATA:  Chest pain EXAM: CHEST - 2 VIEW COMPARISON:  February 19, 2016 FINDINGS: The lungs are clear. The heart size and pulmonary vascularity are normal. No adenopathy. No pneumothorax. No bone lesions. IMPRESSION: No abnormality noted. Electronically Signed   By: Lowella Grip III M.D.   On: 10/16/2019 15:47    Procedures Procedures (including critical care time)  Medications Ordered in ED Medications  albuterol (VENTOLIN HFA) 108 (90 Base) MCG/ACT inhaler 2 puff (2 puffs Inhalation Given 10/16/19 1627)    ED Course  I have reviewed the triage vital signs and the nursing notes.  Pertinent labs & imaging results that were available during my care of the patient were reviewed by me and considered in my medical decision making (see chart for details).    MDM Rules/Calculators/A&P                          Patient presenting today with multiple complaints.  However patient is complaining that  she gets intermittent palpitations which makes her chest feel tight but also has noticed some dyspnea on exertion.  Patient appears slightly anxious on exam but is calm.  She is a smoker and has had an ongoing cough.  Concern for possible COPD that has not been diagnosed versus anxiety related symptoms versus PE as patient is 54 years old and is a smoker.  Patient's oxygen saturation is within normal limits at this time but she is slightly pursed lip breathing and has mild decreased breath sounds.  No wheezing noted on exam.  Low suspicion for infectious etiology at this time or Covid.  Also EKG with T wave inversion but does not look significantly different from prior EKG.  Chest x-ray is clear without evidence of pneumothorax, CHF or infiltrate.  Patient has no distal edema concerning for fluid overload.  She is complaining of diffuse joint pain that is migratory but has no significant evidence of swelling.  Unclear if patient's symptoms may be a result of her untreated granuloma annulare but this time she reports she cannot afford treatment or doctor because she does not have insurance.  Patient has no evidence of septic joint at this time or cellulitis.  She does report that she often times wakes up in the morning with a severe headache and will ensure patient does not have hypercarbia that might be contributing to her symptoms.  Sed rate, vbg and D-dimer are pending but BMP and CBC without acute findings.  Troponin is within normal limits at 3.  We will also consult transitional care team to see if they can help patient establish care.  6:21 PM Sed rate wnl.  Delta trop wnl.  D-dimer wnl and no evidence of CO2 retention.    Due to joint aches and pains  will try a short course of prednisone to see if that helps with her symptoms.  Also given instructions to follow-up with health and wellness.  After albuterol patient felt that it was slightly helpful but reports she did not feel like she can get the  medication in so we will discharged home with an AeroChamber.  MDM Number of Diagnoses or Management Options   Amount and/or Complexity of Data Reviewed Clinical lab tests: ordered and reviewed Tests in the radiology section of CPT: ordered and reviewed Tests in the medicine section of CPT: ordered and reviewed Independent visualization of images, tracings, or specimens: yes  Risk of Complications, Morbidity, and/or Mortality Presenting problems: moderate Diagnostic procedures: low Management options: low  Patient Progress Patient progress: stable    Final Clinical Impression(s) / ED Diagnoses Final diagnoses:  Pain in other joint  DOE (dyspnea on exertion)    Rx / DC Orders ED Discharge Orders         Ordered    predniSONE (DELTASONE) 20 MG tablet  Daily     Discontinue  Reprint     10/16/19 1842           Blanchie Dessert, MD 10/16/19 1842

## 2019-10-16 NOTE — ED Triage Notes (Signed)
Pt reports body aches, joint pain, headache, and heart fluttering over the past few weeks.   Reports L sided chest tightness and mild SOB at present.

## 2019-10-17 MED FILL — FLUTICASONE PROP 0.05% CRM: 0.05 | 30 days supply | Qty: 60 | Fill #1

## 2019-10-17 MED FILL — predniSONE 20 MG TABS: 20 | 5 days supply | Qty: 10 | Fill #0

## 2019-10-17 NOTE — Progress Notes (Signed)
TOC CM spoke to pt and she does have information for appt at [facility]. Gave her information about IT consultant and start the application online. States she will have her son assist her with application. [place], [place] ED TOC CM [phone]

## 2019-11-02 NOTE — Progress Notes (Signed)
Patient ID: Karen Whitehead, female   DOB: 03-08-1966, 54 y.o.   MRN: [id number]     Karen Whitehead, is a 54 y.o. female  WUJ:[id number]  [id number]  DOB - 1965/10/31  Subjective:  Chief Complaint and HPI: Karen Whitehead is a 54 y.o. female here today to establish care and for a follow up visit After being seen in the ED 10/16/2019.  Patient with complicated medical history.  She has a  Complicated medical history including Dengue Fever contracted about 1 Yr ago in [place]  Legionnaire's Dz about 6 yrs ago and treated.  Diagnosed with Granuloma Annulare about 1 yr ago.  On a topical cream for treatment.  Unable to go out in the sun.  Feeling depressed and anxious due to medical conditions and poor sleep.  Never been on meds for depression/anxiety.  I noticed her glucose=153 in the ED-found to be diabetic today with an A1C=9.8.  alos c/o diffuse muscle pain.  Her dermatologist wants her referred to rheumatology with concern of possible polymyositis or other rheumatological issue.  +Smoker  From ED A/P: Patient presenting today with multiple complaints.  However patient is complaining that she gets intermittent palpitations which makes her chest feel tight but also has noticed some dyspnea on exertion.  Patient appears slightly anxious on exam but is calm.  She is a smoker and has had an ongoing cough.  Concern for possible COPD that has not been diagnosed versus anxiety related symptoms versus PE as patient is 54 years old and is a smoker.  Patient's oxygen saturation is within normal limits at this time but she is slightly pursed lip breathing and has mild decreased breath sounds.  No wheezing noted on exam.  Low suspicion for infectious etiology at this time or Covid.  Also EKG with T wave inversion but does not look significantly different from prior EKG.  Chest x-ray is clear without evidence of pneumothorax, CHF or infiltrate.  Patient has no distal edema concerning for fluid overload.  She is complaining  of diffuse joint pain that is migratory but has no significant evidence of swelling.  Unclear if patient's symptoms may be a result of her untreated granuloma annulare but this time she reports she cannot afford treatment or doctor because she does not have insurance.  Patient has no evidence of septic joint at this time or cellulitis.  She does report that she often times wakes up in the morning with a severe headache and will ensure patient does not have hypercarbia that might be contributing to her symptoms.  Sed rate, vbg and D-dimer are pending but BMP and CBC without acute findings.  Troponin is within normal limits at 3.  We will also consult transitional care team to see if they can help patient establish care.  6:21 PM Sed rate wnl.  Delta trop wnl.  D-dimer wnl and no evidence of CO2 retention.    Due to joint aches and pains will try a short course of prednisone to see if that helps with her symptoms.  Also given instructions to follow-up with health and wellness.  After albuterol patient felt that it was slightly helpful but reports she did not feel like she can get the medication in so we will discharged home with an AeroChamber.   ED/Hospital notes reviewed.   Social History:  Unable to work  ROS:   Constitutional:  No f/c, No night sweats, No unexplained weight loss. EENT:  No vision changes, No blurry vision, No hearing  changes. No mouth, throat, or ear problems.  Respiratory: No cough, No SOB Cardiac: No CP, no palpitations GI:  No abd pain, No N/V/D. GU: No Urinary s/sx Musculoskeletal: diffuse muscle pain Neuro: No headache, no dizziness, no motor weakness.  Skin: + rash Endocrine:  No polydipsia. No polyuria.  Psych: Denies SI/HI, +anxiety/depression.  No mania/psychosis  Problem  Granuloma Annulare  History of Dengue  History of Legionnaire's Disease    ALLERGIES: No Known Allergies  PAST MEDICAL HISTORY: Past Medical History:  Diagnosis Date  . Granuloma  annulare     MEDICATIONS AT HOME: Prior to Admission medications   Medication Sig Start Date End Date Taking? Authorizing Provider  albuterol (VENTOLIN HFA) 108 (90 Base) MCG/ACT inhaler Inhale into the lungs every 6 (six) hours as needed for wheezing or shortness of breath.   Yes [provider]  fluticasone (CUTIVATE) 0.005 % ointment Apply 1 application topically 2 (two) times daily.   Yes [provider]  Blood Glucose Monitoring Suppl (TRUE METRIX METER) w/Device KIT 1 each by Does not apply route in the morning and at bedtime. 11/03/19   Argentina Donovan, PA-C  glucose blood (TRUE METRIX BLOOD GLUCOSE TEST) test strip Use as instructed 11/03/19   Argentina Donovan, PA-C  metFORMIN (GLUCOPHAGE) 1000 MG tablet 1/2 tab with food twice daily X 10 days then 1 tab twice daily 11/03/19   Argentina Donovan, PA-C  methocarbamol (ROBAXIN) 500 MG tablet Take 2 tablets (1,000 mg total) by mouth every 8 (eight) hours as needed for muscle spasms. 11/03/19   Argentina Donovan, PA-C  sertraline (ZOLOFT) 50 MG tablet 1/2 daily for 1 week then increase to 1 daily 11/03/19   Argentina Donovan, PA-C  traZODone (DESYREL) 50 MG tablet Take 0.5-1 tablets (25-50 mg total) by mouth at bedtime as needed for sleep. 11/03/19   Argentina Donovan, PA-C  TRUEplus Lancets 28G MISC 1 each by Does not apply route in the morning and at bedtime. 11/03/19   Argentina Donovan, PA-C  potassium chloride (K-DUR) 10 MEQ tablet Take 1 tablet (10 mEq total) by mouth daily. Patient not taking: Reported on 02/19/2016 10/15/14 11/10/18  Rosemarie Ax, MD     Objective:  EXAM:   Vitals:   11/03/19 0912  BP: 125/82  Pulse: 85  Temp: 97.7 F (36.5 C)  TempSrc: Temporal  SpO2: 98%  Weight: 221 lb (100.2 kg)    General appearance : A&OX3. NAD. Non-toxic-appearing;  Tearful on off throughout visit HEENT: Atraumatic and Normocephalic.  PERRLA. EOM intact.   Chest/Lungs:  Breathing-non-labored, Good air entry bilaterally,  breath sounds normal without rales, rhonchi, or wheezing  CVS: S1 S2 regular, no murmurs, gallops, rubs  Extremities: Bilateral Lower Ext shows no edema, both legs are warm to touch with = pulse throughout Neurology:  CN II-XII grossly intact, Non focal.   Psych:  TP linear. J/I WNL. Normal speech. Appropriate eye contact and depressed affect.  Skin:  Large granuloma annulare diffusely  Data Review Lab Results  Component Value Date   HGBA1C 9.8 (A) 11/03/2019     Assessment & Plan   1. Type 2 diabetes mellitus with hyperglycemia, without long-term current use of insulin (HCC) Newly diagnosed.  Check blood sugars bid.  Eliminate sugars and starches. - HgB A1c - Blood Glucose Monitoring Suppl (TRUE METRIX METER) w/Device KIT; 1 each by Does not apply route in the morning and at bedtime.  Dispense: 1 kit; Refill: 0 - TRUEplus  Lancets 28G MISC; 1 each by Does not apply route in the morning and at bedtime.  Dispense: 100 each; Refill: 1 - glucose blood (TRUE METRIX BLOOD GLUCOSE TEST) test strip; Use as instructed  Dispense: 100 each; Refill: 12 - metFORMIN (GLUCOPHAGE) 1000 MG tablet; 1/2 tab with food twice daily X 10 days then 1 tab twice daily  Dispense: 180 tablet; Refill: 3  2. Granuloma annulare Continue with derm  3. History of dengue resolved  4. History of Legionnaire's disease resolved  5. Muscle pain - Ambulatory referral to Rheumatology - methocarbamol (ROBAXIN) 500 MG tablet; Take 2 tablets (1,000 mg total) by mouth every 8 (eight) hours as needed for muscle spasms.  Dispense: 90 tablet; Refill: 2  6. Anxiety  - sertraline (ZOLOFT) 50 MG tablet; 1/2 daily for 1 week then increase to 1 daily  Dispense: 30 tablet; Refill: 3  7. Insomnia, unspecified type - traZODone (DESYREL) 50 MG tablet; Take 0.5-1 tablets (25-50 mg total) by mouth at bedtime as needed for sleep.  Dispense: 30 tablet; Refill: 3  8. Depression, unspecified depression type - Ambulatory referral to  Social Work - sertraline (ZOLOFT) 50 MG tablet; 1/2 daily for 1 week then increase to 1 daily  Dispense: 30 tablet; Refill: 3  9.  Hospital follow up I spent >53mns mins face to face with patient, educating on diabetes, counseling on depression/anxiety/insomnia,etc.  Patient expresses feeling some reassurance despite feeling overwhelmed.  Contracts for safety.    Patient have been counseled extensively about nutrition and exercise  Return in about 3 weeks (around 11/24/2019) for assign PCP; recheck/review blood sugars.  The patient was given clear instructions to go to ER or return to medical center if symptoms don't improve, worsen or new problems develop. The patient verbalized understanding. The patient was told to call to get lab results if they haven't heard anything in the next week.     AFreeman Caldron PA-C [facility]  11/03/2019, 9:51 AM

## 2019-11-03 ENCOUNTER — Other Ambulatory Visit: Payer: Self-pay

## 2019-11-03 ENCOUNTER — Ambulatory Visit: Payer: Self-pay | Attending: Physician Assistant | Admitting: Physician Assistant

## 2019-11-03 ENCOUNTER — Encounter: Payer: Self-pay | Admitting: Physician Assistant

## 2019-11-03 VITALS — BP 125/82 | HR 85 | Temp 97.7°F | Wt 221.0 lb

## 2019-11-03 DIAGNOSIS — E1165 Type 2 diabetes mellitus with hyperglycemia: Secondary | ICD-10-CM

## 2019-11-03 DIAGNOSIS — Z8619 Personal history of other infectious and parasitic diseases: Secondary | ICD-10-CM | POA: Insufficient documentation

## 2019-11-03 DIAGNOSIS — L920 Granuloma annulare: Secondary | ICD-10-CM | POA: Insufficient documentation

## 2019-11-03 DIAGNOSIS — G4700 Insomnia, unspecified: Secondary | ICD-10-CM

## 2019-11-03 DIAGNOSIS — R739 Hyperglycemia, unspecified: Secondary | ICD-10-CM

## 2019-11-03 DIAGNOSIS — Z09 Encounter for follow-up examination after completed treatment for conditions other than malignant neoplasm: Secondary | ICD-10-CM

## 2019-11-03 DIAGNOSIS — F32A Depression, unspecified: Secondary | ICD-10-CM

## 2019-11-03 DIAGNOSIS — M791 Myalgia, unspecified site: Secondary | ICD-10-CM

## 2019-11-03 DIAGNOSIS — F419 Anxiety disorder, unspecified: Secondary | ICD-10-CM

## 2019-11-03 DIAGNOSIS — F329 Major depressive disorder, single episode, unspecified: Secondary | ICD-10-CM

## 2019-11-03 LAB — POCT GLYCOSYLATED HEMOGLOBIN (HGB A1C): Hemoglobin A1C: 9.8 % — AB (ref 4.0–5.6)

## 2019-11-03 MED ORDER — TRUEPLUS LANCETS 28G MISC
1.0000 | Freq: Two times a day (BID) | 1 refills | Status: DC
  Filled 2020-09-21: qty 100, 50d supply, fill #0

## 2019-11-03 MED ORDER — TRUE METRIX METER W/DEVICE KIT: 1.0000 | PACK | Freq: Two times a day (BID) | 0 refills | Status: DC

## 2019-11-03 MED ORDER — METHOCARBAMOL 500 MG PO TABS: 1000.0000 mg | ORAL_TABLET | Freq: Three times a day (TID) | ORAL | 2 refills | Status: DC | PRN

## 2019-11-03 MED ORDER — METFORMIN HCL 1000 MG PO TABS: ORAL_TABLET | ORAL | 3 refills | Status: DC

## 2019-11-03 MED ORDER — SERTRALINE HCL 50 MG PO TABS: ORAL_TABLET | ORAL | 3 refills | Status: DC

## 2019-11-03 MED ORDER — TRAZODONE HCL 50 MG PO TABS: 25.0000 mg | ORAL_TABLET | Freq: Every evening | ORAL | 3 refills | Status: DC | PRN

## 2019-11-03 MED ORDER — TRUE METRIX BLOOD GLUCOSE TEST VI STRP: ORAL_STRIP | 12 refills | Status: DC

## 2019-11-03 MED FILL — traZODone HCL 50 MG TABS: 50 | 30 days supply | Qty: 30 | Fill #0

## 2019-11-03 MED FILL — SERTRALINE HCL 50 MG TABLET: 50 | 30 days supply | Qty: 30 | Fill #0

## 2019-11-03 MED FILL — METHOCARBAMOL 500 MG TABS: 500 | 15 days supply | Qty: 90 | Fill #0

## 2019-11-03 MED FILL — metFORMIN HCL 1000 MG TABS: 1000 | 30 days supply | Qty: 60 | Fill #0

## 2019-11-03 MED FILL — TRUE METRIX TEST STRIP: 25 days supply | Qty: 100 | Fill #0

## 2019-11-03 MED FILL — !TRUE METRIX BLOOD GLUCOSE: 30 days supply | Qty: 1 | Fill #0

## 2019-11-03 MED FILL — TRUEplus LANCETS 28G MISC: 50 days supply | Qty: 100 | Fill #0

## 2019-11-03 NOTE — Patient Instructions (Signed)
Focus your diet around protein and vegetables.  Eliminate sugar and white carbohydrates.  Drink 80-100 ounces water daily.   Living With Depression Everyone experiences occasional disappointment, sadness, and loss in their lives. When you are feeling down, blue, or sad for at least 2 weeks in a row, it may mean that you have depression. Depression can affect your thoughts and feelings, relationships, daily activities, and physical health. It is caused by changes in the way your brain functions. If you receive a diagnosis of depression, your health care provider will tell you which type of depression you have and what treatment options are available to you. If you are living with depression, there are ways to help you recover from it and also ways to prevent it from coming back. How to cope with lifestyle changes Coping with stress     Stress is your body's reaction to life changes and events, both good and bad. Stressful situations may include:  Getting married.  The death of a spouse.  Losing a job.  Retiring.  Having a baby. Stress can last just a few hours or it can be ongoing. Stress can play a major role in depression, so it is important to learn both how to cope with stress and how to think about it differently. Talk with your health care provider or a counselor if you would like to learn more about stress reduction. He or she may suggest some stress reduction techniques, such as:  Music therapy. This can include creating music or listening to music. Choose music that you enjoy and that inspires you.  Mindfulness-based meditation. This kind of meditation can be done while sitting or walking. It involves being aware of your normal breaths, rather than trying to control your breathing.  Centering prayer. This is a kind of meditation that involves focusing on a spiritual word or phrase. Choose a word, phrase, or sacred image that is meaningful to you and that brings you peace.  Deep  breathing. To do this, expand your stomach and inhale slowly through your nose. Hold your breath for 3-5 seconds, then exhale slowly, allowing your stomach muscles to relax.  Muscle relaxation. This involves intentionally tensing muscles then relaxing them. Choose a stress reduction technique that fits your lifestyle and personality. Stress reduction techniques take time and practice to develop. Set aside 5-15 minutes a day to do them. Therapists can offer training in these techniques. The training may be covered by some insurance plans. Other things you can do to manage stress include:  Keeping a stress diary. This can help you learn what triggers your stress and ways to control your response.  Understanding what your limits are and saying no to requests or events that lead to a schedule that is too full.  Thinking about how you respond to certain situations. You may not be able to control everything, but you can control how you react.  Adding humor to your life by watching funny films or TV shows.  Making time for activities that help you relax and not feeling guilty about spending your time this way.  Medicines Your health care provider may suggest certain medicines if he or she feels that they will help improve your condition. Avoid using alcohol and other substances that may prevent your medicines from working properly (may interact). It is also important to:  Talk with your pharmacist or health care provider about all the medicines that you take, their possible side effects, and what medicines are safe to  take together.  Make it your goal to take part in all treatment decisions (shared decision-making). This includes giving input on the side effects of medicines. It is best if shared decision-making with your health care provider is part of your total treatment plan. If your health care provider prescribes a medicine, you may not notice the full benefits of it for 4-8 weeks. Most people  who are treated for depression need to be on medicine for at least 6-12 months after they feel better. If you are taking medicines as part of your treatment, do not stop taking medicines without first talking to your health care provider. You may need to have the medicine slowly decreased (tapered) over time to decrease the risk of harmful side effects. Relationships Your health care provider may suggest family therapy along with individual therapy and drug therapy. While there may not be family problems that are causing you to feel depressed, it is still important to make sure your family learns as much as they can about your mental health. Having your family's support can help make your treatment successful. How to recognize changes in your condition Everyone has a different response to treatment for depression. Recovery from major depression happens when you have not had signs of major depression for two months. This may mean that you will start to:  Have more interest in doing activities.  Feel less hopeless than you did 2 months ago.  Have more energy.  Overeat less often, or have better or improving appetite.  Have better concentration. Your health care provider will work with you to decide the next steps in your recovery. It is also important to recognize when your condition is getting worse. Watch for these signs:  Having fatigue or low energy.  Eating too much or too little.  Sleeping too much or too little.  Feeling restless, agitated, or hopeless.  Having trouble concentrating or making decisions.  Having unexplained physical complaints.  Feeling irritable, angry, or aggressive. Get help as soon as you or your family members notice these symptoms coming back. How to get support and help from others How to talk with friends and family members about your condition  Talking to friends and family members about your condition can provide you with one way to get support and  guidance. Reach out to trusted friends or family members, explain your symptoms to them, and let them know that you are working with a health care provider to treat your depression. Financial resources Not all insurance plans cover mental health care, so it is important to check with your insurance carrier. If paying for co-pays or counseling services is a problem, search for a local or county mental health care center. They may be able to offer public mental health care services at low or no cost when you are not able to see a private health care provider. If you are taking medicine for depression, you may be able to get the generic form, which may be less expensive. Some makers of prescription medicines also offer help to patients who cannot afford the medicines they need. Follow these instructions at home:   Get the right amount and quality of sleep.  Cut down on using caffeine, tobacco, alcohol, and other potentially harmful substances.  Try to exercise, such as walking or lifting small weights.  Take over-the-counter and prescription medicines only as told by your health care provider.  Eat a healthy diet that includes plenty of vegetables, fruits, whole grains, low-fat dairy  products, and lean protein. Do not eat a lot of foods that are high in solid fats, added sugars, or salt.  Keep all follow-up visits as told by your health care provider. This is important. Contact a health care provider if:  You stop taking your antidepressant medicines, and you have any of these symptoms: ? Nausea. ? Headache. ? Feeling lightheaded. ? Chills and body aches. ? Not being able to sleep (insomnia).  You or your friends and family think your depression is getting worse. Get help right away if:  You have thoughts of hurting yourself or others. If you ever feel like you may hurt yourself or others, or have thoughts about taking your own life, get help right away. You can go to your nearest  emergency department or call:  Your local emergency services (911 in the [place]  A suicide crisis helpline, such as the [place] at (516)819-6300. This is open 24-hours a day. Summary  If you are living with depression, there are ways to help you recover from it and also ways to prevent it from coming back.  Work with your health care team to create a management plan that includes counseling, stress management techniques, and healthy lifestyle habits. This information is not intended to replace advice given to you by your health care provider. Make sure you discuss any questions you have with your health care provider. Document Revised: 08/13/2018 Document Reviewed: 03/24/2016 Elsevier Patient Education  [place].  Insomnia Insomnia is a sleep disorder that makes it difficult to fall asleep or stay asleep. Insomnia can cause fatigue, low energy, difficulty concentrating, mood swings, and poor performance at work or school. There are three different ways to classify insomnia:  Difficulty falling asleep.  Difficulty staying asleep.  Waking up too early in the morning. Any type of insomnia can be long-term (chronic) or short-term (acute). Both are common. Short-term insomnia usually lasts for three months or less. Chronic insomnia occurs at least three times a week for longer than three months. What are the causes? Insomnia may be caused by another condition, situation, or substance, such as:  Anxiety.  Certain medicines.  Gastroesophageal reflux disease (GERD) or other gastrointestinal conditions.  Asthma or other breathing conditions.  Restless legs syndrome, sleep apnea, or other sleep disorders.  Chronic pain.  Menopause.  Stroke.  Abuse of alcohol, tobacco, or illegal drugs.  Mental health conditions, such as depression.  Caffeine.  Neurological disorders, such as Alzheimer's disease.  An overactive thyroid  (hyperthyroidism). Sometimes, the cause of insomnia may not be known. What increases the risk? Risk factors for insomnia include:  Gender. Women are affected more often than men.  Age. Insomnia is more common as you get older.  Stress.  Lack of exercise.  Irregular work schedule or working night shifts.  Traveling between different time zones.  Certain medical and mental health conditions. What are the signs or symptoms? If you have insomnia, the main symptom is having trouble falling asleep or having trouble staying asleep. This may lead to other symptoms, such as:  Feeling fatigued or having low energy.  Feeling nervous about going to sleep.  Not feeling rested in the morning.  Having trouble concentrating.  Feeling irritable, anxious, or depressed. How is this diagnosed? This condition may be diagnosed based on:  Your symptoms and medical history. Your health care provider may ask about: ? Your sleep habits. ? Any medical conditions you have. ? Your mental health.  A physical  exam. How is this treated? Treatment for insomnia depends on the cause. Treatment may focus on treating an underlying condition that is causing insomnia. Treatment may also include:  Medicines to help you sleep.  Counseling or therapy.  Lifestyle adjustments to help you sleep better. Follow these instructions at home: Eating and drinking   Limit or avoid alcohol, caffeinated beverages, and cigarettes, especially close to bedtime. These can disrupt your sleep.  Do not eat a large meal or eat spicy foods right before bedtime. This can lead to digestive discomfort that can make it hard for you to sleep. Sleep habits   Keep a sleep diary to help you and your health care provider figure out what could be causing your insomnia. Write down: ? When you sleep. ? When you wake up during the night. ? How well you sleep. ? How rested you feel the next day. ? Any side effects of medicines you  are taking. ? What you eat and drink.  Make your bedroom a dark, comfortable place where it is easy to fall asleep. ? Put up shades or blackout curtains to block light from outside. ? Use a white noise machine to block noise. ? Keep the temperature cool.  Limit screen use before bedtime. This includes: ? Watching TV. ? Using your smartphone, tablet, or computer.  Stick to a routine that includes going to bed and waking up at the same times every day and night. This can help you fall asleep faster. Consider making a quiet activity, such as reading, part of your nighttime routine.  Try to avoid taking naps during the day so that you sleep better at night.  Get out of bed if you are still awake after 15 minutes of trying to sleep. Keep the lights down, but try reading or doing a quiet activity. When you feel sleepy, go back to bed. General instructions  Take over-the-counter and prescription medicines only as told by your health care provider.  Exercise regularly, as told by your health care provider. Avoid exercise starting several hours before bedtime.  Use relaxation techniques to manage stress. Ask your health care provider to suggest some techniques that may work well for you. These may include: ? Breathing exercises. ? Routines to release muscle tension. ? Visualizing peaceful scenes.  Make sure that you drive carefully. Avoid driving if you feel very sleepy.  Keep all follow-up visits as told by your health care provider. This is important. Contact a health care provider if:  You are tired throughout the day.  You have trouble in your daily routine due to sleepiness.  You continue to have sleep problems, or your sleep problems get worse. Get help right away if:  You have serious thoughts about hurting yourself or someone else. If you ever feel like you may hurt yourself or others, or have thoughts about taking your own life, get help right away. You can go to your nearest  emergency department or call:  Your local emergency services (911 in the [place]  A suicide crisis helpline, such as the [place] at 305 006 5540. This is open 24 hours a day. Summary  Insomnia is a sleep disorder that makes it difficult to fall asleep or stay asleep.  Insomnia can be long-term (chronic) or short-term (acute).  Treatment for insomnia depends on the cause. Treatment may focus on treating an underlying condition that is causing insomnia.  Keep a sleep diary to help you and your health care provider figure out  what could be causing your insomnia. This information is not intended to replace advice given to you by your health care provider. Make sure you discuss any questions you have with your health care provider. Document Revised: 04/03/2017 Document Reviewed: 01/29/2017 Elsevier Patient Education  2020 [employer].

## 2019-11-15 ENCOUNTER — Telehealth: Payer: Self-pay

## 2019-11-15 NOTE — Telephone Encounter (Signed)
Pt was call and explain what she need to provide for qualified for the CAFA and the [facility] program

## 2019-11-15 NOTE — Telephone Encounter (Signed)
2020 pt was filed as a dependant on son's taxes. Do you need his copy and / or info.

## 2019-11-16 ENCOUNTER — Ambulatory Visit: Payer: Self-pay | Attending: Family Medicine | Admitting: Licensed Clinical Social Worker

## 2019-11-16 ENCOUNTER — Other Ambulatory Visit: Payer: Self-pay

## 2019-11-16 DIAGNOSIS — F411 Generalized anxiety disorder: Secondary | ICD-10-CM

## 2019-11-16 DIAGNOSIS — F331 Major depressive disorder, recurrent, moderate: Secondary | ICD-10-CM
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
## 2019-11-22 ENCOUNTER — Other Ambulatory Visit: Payer: Self-pay

## 2019-11-22 ENCOUNTER — Ambulatory Visit: Payer: Self-pay

## 2019-11-24 ENCOUNTER — Ambulatory Visit: Payer: Self-pay | Attending: Physician Assistant | Admitting: Physician Assistant

## 2019-11-24 ENCOUNTER — Ambulatory Visit (HOSPITAL_BASED_OUTPATIENT_CLINIC_OR_DEPARTMENT_OTHER): Payer: Self-pay | Admitting: Licensed Clinical Social Worker

## 2019-11-24 ENCOUNTER — Other Ambulatory Visit: Payer: Self-pay | Admitting: Physician Assistant

## 2019-11-24 ENCOUNTER — Encounter: Payer: Self-pay | Admitting: Physician Assistant

## 2019-11-24 ENCOUNTER — Other Ambulatory Visit: Payer: Self-pay

## 2019-11-24 VITALS — BP 115/71 | HR 77 | Ht 64.0 in | Wt 216.0 lb

## 2019-11-24 DIAGNOSIS — E1165 Type 2 diabetes mellitus with hyperglycemia: Secondary | ICD-10-CM

## 2019-11-24 DIAGNOSIS — F419 Anxiety disorder, unspecified: Secondary | ICD-10-CM

## 2019-11-24 DIAGNOSIS — F329 Major depressive disorder, single episode, unspecified: Secondary | ICD-10-CM

## 2019-11-24 DIAGNOSIS — F411 Generalized anxiety disorder: Secondary | ICD-10-CM

## 2019-11-24 DIAGNOSIS — F32A Depression, unspecified: Secondary | ICD-10-CM

## 2019-11-24 DIAGNOSIS — M791 Myalgia, unspecified site: Secondary | ICD-10-CM

## 2019-11-24 DIAGNOSIS — F331 Major depressive disorder, recurrent, moderate: Secondary | ICD-10-CM

## 2019-11-24 DIAGNOSIS — G4700 Insomnia, unspecified: Secondary | ICD-10-CM

## 2019-11-24 LAB — GLUCOSE, POCT (MANUAL RESULT ENTRY): POC Glucose: 136 mg/dl — AB (ref 70–99)

## 2019-11-24 MED ORDER — CYCLOBENZAPRINE HCL 10 MG PO TABS: 10.0000 mg | ORAL_TABLET | Freq: Every day | ORAL | 1 refills | Status: DC

## 2019-11-24 MED ORDER — TRAZODONE HCL 50 MG PO TABS: ORAL_TABLET | ORAL | 3 refills | Status: DC

## 2019-11-24 MED ORDER — ALBUTEROL SULFATE HFA 108 (90 BASE) MCG/ACT IN AERS: 1.0000 | INHALATION_SPRAY | Freq: Four times a day (QID) | RESPIRATORY_TRACT | 1 refills | Status: DC | PRN

## 2019-11-24 MED ORDER — SERTRALINE HCL 100 MG PO TABS: 100.0000 mg | ORAL_TABLET | Freq: Every day | ORAL | 3 refills | Status: DC

## 2019-11-24 MED FILL — CYCLOBENZAPRINE 10 MG TAB: 10 | 30 days supply | Qty: 30 | Fill #0

## 2019-11-24 MED FILL — SERTRALINE HCL 100 MG TAB: 100 | 30 days supply | Qty: 30 | Fill #0

## 2019-11-24 MED FILL — ?TRAZODONE HCL 50 TABS: 50 | 15 days supply | Qty: 30 | Fill #0

## 2019-11-24 MED FILL — ALBUTEROL SULFATE HFA 108 (: 108 (90 BAS | 25 days supply | Qty: 18 | Fill #0

## 2019-11-24 NOTE — Progress Notes (Signed)
Karen Whitehead, is a 54 y.o. female  MVV:[id number]  PYY:[id number]  DOB - 02-May-1966  Subjective:  Chief Complaint and HPI: Karen Whitehead is a 54 y.o. female here today for 3 week f/up.  She needed to be assigned to a PCP but was placed on my schedule due to lack of appts.  Some improvement with depression and anxiety.  Not crying as much.  Says trazadone only helping a little with  Sleep.  Difficulty concentrating.  Some anxiety/panic attacks but less than at initial visit.  PHQ remains 22.  Passive SI without plan or intent.  See last note.   Depression screen [facility] 2/9 11/16/2019 11/03/2019  Decreased Interest 3 3  Down, Depressed, Hopeless 3 3  PHQ - 2 Score 6 6  Altered sleeping 3 3  Tired, decreased energy 3 3  Change in appetite 3 3  Feeling bad or failure about yourself  3 3  Trouble concentrating 3 2  Moving slowly or fidgety/restless 0 -  Suicidal thoughts 1 1  PHQ-9 Score 22 21     ROS:   Constitutional:  No f/c, No night sweats, No unexplained weight loss. EENT:  No vision changes, No blurry vision, No hearing changes. No mouth, throat, or ear problems.  Respiratory: No cough, No SOB Cardiac: No CP, no palpitations GI:  No abd pain, No N/V/D. GU: No Urinary s/sx Musculoskeletal: No joint pain  + muscle aches Neuro: No headache, no dizziness, no motor weakness.  Skin: No rash Endocrine:  No polydipsia. No polyuria.  Psych: Denies SI/HI  No problems updated.  ALLERGIES: No Known Allergies  PAST MEDICAL HISTORY: Past Medical History:  Diagnosis Date  . Granuloma annulare     MEDICATIONS AT HOME: Prior to Admission medications   Medication Sig Start Date End Date Taking? Authorizing Provider  albuterol (VENTOLIN HFA) 108 (90 Base) MCG/ACT inhaler Inhale 1-2 puffs into the lungs every 6 (six) hours as needed for wheezing or shortness of breath. 11/24/19  Yes Lastacia Solum M, PA-C  Blood Glucose Monitoring Suppl (TRUE METRIX METER) w/Device KIT 1 each by Does  not apply route in the morning and at bedtime. 11/03/19  Yes Monty Spicher M, PA-C  fluticasone (CUTIVATE) 0.005 % ointment Apply 1 application topically 2 (two) times daily.   Yes [provider]  glucose blood (TRUE METRIX BLOOD GLUCOSE TEST) test strip Use as instructed 11/03/19  Yes Nike Southwell M, PA-C  metFORMIN (GLUCOPHAGE) 1000 MG tablet 1/2 tab with food twice daily X 10 days then 1 tab twice daily 11/03/19  Yes Tayla Panozzo M, PA-C  methocarbamol (ROBAXIN) 500 MG tablet Take 2 tablets (1,000 mg total) by mouth every 8 (eight) hours as needed for muscle spasms. 11/03/19  Yes Freeman Caldron M, PA-C  traZODone (DESYREL) 50 MG tablet 1-2 tabs as needed to help with sleep 11/24/19  Yes Rilan Eiland, Dionne Bucy, PA-C  TRUEplus Lancets 28G MISC 1 each by Does not apply route in the morning and at bedtime. 11/03/19  Yes Freeman Caldron M, PA-C  cyclobenzaprine (FLEXERIL) 10 MG tablet Take 1 tablet (10 mg total) by mouth at bedtime. Prn muscle pain 11/24/19   Argentina Donovan, PA-C  sertraline (ZOLOFT) 100 MG tablet Take 1 tablet (100 mg total) by mouth daily. 11/24/19   Argentina Donovan, PA-C  potassium chloride (K-DUR) 10 MEQ tablet Take 1 tablet (10 mEq total) by mouth daily. Patient not taking: Reported on 02/19/2016 10/15/14 11/10/18  Rosemarie Ax, MD  Objective:  EXAM:   Vitals:   11/24/19 0852  BP: 115/71  Pulse: 77  SpO2: 98%  Weight: 216 lb (98 kg)  Height: _0  (1.626 m)    General appearance : A&OX3. NAD. Non-toxic-appearing.  Tearful at times HEENT: Atraumatic and Normocephalic.  PERRLA. EOM intact.   Chest/Lungs:  Breathing-non-labored, Good air entry bilaterally, breath sounds normal without rales, rhonchi, or wheezing  CVS: S1 S2 regular, no murmurs, gallops, rubs  Extremities: Bilateral Lower Ext shows no edema, both legs are warm to touch with = pulse throughout Neurology:  CN II-XII grossly intact, Non focal.   Psych:  TP linear. J/I fair. Normal speech.  Appropriate eye contact and depressed affect.  Skin:  No Rash  Data Review Lab Results  Component Value Date   HGBA1C 9.8 (A) 11/03/2019     Assessment & Plan   1. Type 2 diabetes mellitus with hyperglycemia, without long-term current use of insulin (HCC) Improving.  Glucose running in 100s overall - Glucose (CBG) Continue metformin and diabetic diet   2. Insomnia, unspecified type Sleep hygiene reviewed.  Increase dose- - traZODone (DESYREL) 50 MG tablet; 1-2 tabs as needed to help with sleep  Dispense: 30 tablet; Refill: 3  3. Anxiety Improving overall, will increase zoloft bc tolerating without SE. Counseled and had her see LCSW May also help with focus - sertraline (ZOLOFT) 100 MG tablet; Take 1 tablet (100 mg total) by mouth daily.  Dispense: 30 tablet; Refill: 3  4. Depression, unspecified depression type Improving overall, will increase zoloft bc tolerating without SE. Counseled and had her see LCSW May also help with focus - sertraline (ZOLOFT) 100 MG tablet; Take 1 tablet (100 mg total) by mouth daily.  Dispense: 30 tablet; Refill: 3  5. Muscle pain For bedtime(which will also help sleep) - cyclobenzaprine (FLEXERIL) 10 MG tablet; Take 1 tablet (10 mg total) by mouth at bedtime. Prn muscle pain  Dispense: 30 tablet; Refill: 1  6. Moderate episode of recurrent major depressive disorder (HCC) - sertraline (ZOLOFT) 100 MG tablet; Take 1 tablet (100 mg total) by mouth daily.  Dispense: 30 tablet; Refill: 3  Spent >40 mins face to face.     Patient have been counseled extensively about nutrition and exercise  Return in about 1 month (around 12/25/2019) for [place] PCP for next appt.  .  The patient was given clear instructions to go to ER or return to medical center if symptoms don't improve, worsen or new problems develop. The patient verbalized understanding. The patient was told to call to get lab results if they haven't heard anything in the next week.      Freeman Caldron, PA-C [facility] [place]   11/24/2019, 9:21 AMPatient ID: [name], female   DOB: [birth year], 54 y.o.   MRN: [id number]

## 2019-12-01 NOTE — BH Specialist Note (Signed)
Integrated Behavioral Health Follow Up Visit  MRN: [id number] Name: Karen Whitehead  Number of [place] Clinician visits: 2/6 Session Start time: 9:30 AM  Session End time: 10:00 AM Total time: 30  Type of Service: [place] Interpretor:No. Interpretor Name and Language: NA  SUBJECTIVE: Karen Whitehead is a 54 y.o. female accompanied by self Patient was referred by Weyman Pedro for depression and anxiety. Patient reports the following symptoms/concerns: Pt reports difficulty managing depression and anxiety symptoms. States that she has had three panic attacks and has ongoing difficulty obtaining sleep Duration of problem: Ongoing; Severity of problem: severe  OBJECTIVE: Mood: Anxious and Affect: Depressed Risk of harm to self or others: No plan to harm self or others Scored positive on phq9; however, denies SI/HI or intent to harm self/others  LIFE CONTEXT: Family and Social: Pt receives strong support from son School/Work: Pt is not employed. 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  Reduce symptoms of: anxiety and depression  2.  Increase knowledge and/or ability of: self-management skills  3.  Demonstrate ability to: Increase healthy adjustment to current life circumstances and Increase adequate support systems for patient/family  INTERVENTIONS: Interventions utilized:  Solution-Focused Strategies, Mindfulness or Relaxation Training and Supportive Counseling Standardized Assessments completed: GAD-7 and PHQ 2&9 with C-SSRS  ASSESSMENT: Patient currently experiencing depression and anxiety symptoms, including panic attacks and difficulty obtaining sleep. Pt scored positive on phq9; however, denies SI/HI.    Patient may benefit from continued medication management and therapy. Grounding interventions discussed to assist with relaxation and panic attacks.   PLAN: 1. Follow up with behavioral health clinician on : 12/07/2019 2. Behavioral recommendations: Utilize strategies discussed and comply with medications 3. Referral(s): [place] (In Clinic) 4. "From scale of 1-10, how likely are you to follow plan?":   Rebekah Chesterfield, LCSW 12/01/2019 11:53 AM

## 2019-12-07 ENCOUNTER — Ambulatory Visit: Payer: Self-pay | Attending: Family Medicine | Admitting: Licensed Clinical Social Worker

## 2019-12-07 DIAGNOSIS — F331 Major depressive disorder, recurrent, moderate: Secondary | ICD-10-CM

## 2019-12-07 DIAGNOSIS — F411 Generalized anxiety disorder: Secondary | ICD-10-CM

## 2019-12-08 MED FILL — metFORMIN HCL 1000 MG TABS: 1000 | 30 days supply | Qty: 60 | Fill #1

## 2019-12-13 ENCOUNTER — Other Ambulatory Visit: Payer: Self-pay

## 2019-12-16 NOTE — BH Specialist Note (Signed)
[place] Visit via Telemedicine (Telephone)  12/07/2019 Karen Whitehead [id number]   Session Start time: 10:05 AM  Session End time: 10:20 AM Total time: 15 minutes  Referring Provider: Weyman Pedro Type of Visit: Telephonic Patient location: Home [facility] Provider location: Office All persons participating in visit: LCSW and patient   Discussed confidentiality: Yes   "By engaging in this telephone visit, you consent to the provision of healthcare.  Additionally, you authorize for your insurance to be billed for the services provided during this telephone visit."   Patient and/or legal guardian consented to telephone visit: Yes   PRESENTING CONCERNS: Patient and/or family reports the following symptoms/concerns: Continues to endorse anxiety noting depression symptoms have decreased since start of medication.  Patient reports racing thoughts. Duration of problem: Ongoing; Severity of problem: severe  STRENGTHS (Protective Factors/Coping Skills): Received strong support from sister and adult son Patient is participating in medication management through PCP Patient has good insight  GOALS ADDRESSED: Patient will: 1.  Reduce symptoms of: anxiety and depression  2.  Increase knowledge and/or ability of: self-management skills  3.  Demonstrate ability to: Increase healthy adjustment to current life circumstances, Increase adequate support systems for patient/family and Increase motivation to adhere to plan of care  INTERVENTIONS: Interventions utilized:  Behavioral Activation and Supportive Counseling Standardized Assessments completed: Not Needed  ASSESSMENT: Patient currently experiencing difficulty managing depression anxiety symptoms.  Symptoms include racing thoughts, withdrawn behavior, and panic attacks.   Patient may benefit from continued medication management and therapy.  Patient reports "feeling calmer" and stated that she has been able to fix her hair  and check mail at night.  LCSW commended patient on progress and encouraged her to continue behavioral activation strategies to promote feelings of productivity and decrease of symptoms.  Patient reports side effects of Metformin stating that she has been in the bathroom often.  LCSW encouraged patient to address medication with provider at upcoming appointment on December 21, 2019  PLAN: 1. Follow up with behavioral health clinician on : Contact LCSW with any additional behavioral health and/or resource needs 2. Behavioral recommendations: Utilize strategies discussed and comply with medication management 3. Referral(s): [place] (In Clinic)  [place] [facility] Arita [facility]   Confirmed patient's address: Yes  Confirmed patient's phone number: Yes  Any changes to demographics: No   Confirmed patient's insurance: Yes  Any changes to patient's insurance: No    The following statements were read to the patient and/or legal guardian that are established with the [facility] Provider.  "The purpose of this phone visit is to provide behavioral health care while limiting exposure to the coronavirus (COVID19).  There is a possibility of technology failure and discussed alternative modes of communication if that failure occurs."

## 2019-12-21 ENCOUNTER — Ambulatory Visit: Payer: Self-pay | Attending: Family Medicine | Admitting: Family Medicine

## 2019-12-21 ENCOUNTER — Other Ambulatory Visit: Payer: Self-pay | Admitting: Family Medicine

## 2019-12-21 DIAGNOSIS — E1165 Type 2 diabetes mellitus with hyperglycemia: Secondary | ICD-10-CM

## 2019-12-21 DIAGNOSIS — G4700 Insomnia, unspecified: Secondary | ICD-10-CM

## 2019-12-21 DIAGNOSIS — F411 Generalized anxiety disorder: Secondary | ICD-10-CM

## 2019-12-21 MED ORDER — GLIPIZIDE 5 MG PO TABS: 5.0000 mg | ORAL_TABLET | Freq: Two times a day (BID) | ORAL | 3 refills | Status: DC

## 2019-12-21 MED ORDER — TRAZODONE HCL 100 MG PO TABS: 100.0000 mg | ORAL_TABLET | Freq: Every day | ORAL | 3 refills | Status: DC

## 2019-12-21 MED ORDER — BUSPIRONE HCL 7.5 MG PO TABS: 7.5000 mg | ORAL_TABLET | Freq: Two times a day (BID) | ORAL | 3 refills | Status: DC

## 2019-12-21 NOTE — Progress Notes (Signed)
Virtual Visit via Telephone Note  I connected with Karen Whitehead, on 12/21/2019 at 3:10 PM by telephone due to the COVID-19 pandemic and verified that I am speaking with the correct person using two identifiers.   Consent: I discussed the limitations, risks, security and privacy concerns of performing an evaluation and management service by telephone and the availability of in person appointments. I also discussed with the patient that there may be a patient responsible charge related to this service. The patient expressed understanding and agreed to proceed.   Location of Patient: Home  Location of Provider: Clinic   Persons participating in Telemedicine visit: Laurena Spies Dr. Margarita Rana     History of Present Illness: 65 year olf female with Type2 DM (A1c9.8), anxiety and depression, insomnia who presents today to establish care.  She has Granuloma Annulare and is sensitive to the sun; she has to cover her body to go out. Diagnosed in 12/2018 but symptoms started in 07/2018.  Currently on fluticasone which was prescribed by her dermatologist.  She has anxiety , insomnia and sleeps for 2 hours even with Trazodone. She has panic attacks (had one major one this week) and is unsure of triggers. Does not want to go out and has a lot of anxiety. When Zoloft was increased from 50 to $Rem'100mg'QKTp$  she had slight improvement in her depressive symptoms but now does not feel any different.  Has had therapy sessions with LCSW in the clinic.   Complains of diarrhea with metformin. Blood sugars have been 190 in the morning and 148 in the evening. Lowest was 138 bedtime, denies hypoglycemic episodes.  Past Medical History:  Diagnosis Date  . Granuloma annulare    No Known Allergies  Current Outpatient Medications on File Prior to Visit  Medication Sig Dispense Refill  . albuterol (VENTOLIN HFA) 108 (90 Base) MCG/ACT inhaler Inhale 1-2 puffs into the lungs every 6 (six) hours as  needed for wheezing or shortness of breath. 18 g 1  . Blood Glucose Monitoring Suppl (TRUE METRIX METER) w/Device KIT 1 each by Does not apply route in the morning and at bedtime. 1 kit 0  . cyclobenzaprine (FLEXERIL) 10 MG tablet Take 1 tablet (10 mg total) by mouth at bedtime. Prn muscle pain 30 tablet 1  . fluticasone (CUTIVATE) 0.005 % ointment Apply 1 application topically 2 (two) times daily.    Marland Kitchen glucose blood (TRUE METRIX BLOOD GLUCOSE TEST) test strip Use as instructed 100 each 12  . metFORMIN (GLUCOPHAGE) 1000 MG tablet 1/2 tab with food twice daily X 10 days then 1 tab twice daily 180 tablet 3  . methocarbamol (ROBAXIN) 500 MG tablet Take 2 tablets (1,000 mg total) by mouth every 8 (eight) hours as needed for muscle spasms. 90 tablet 2  . sertraline (ZOLOFT) 100 MG tablet Take 1 tablet (100 mg total) by mouth daily. 30 tablet 3  . traZODone (DESYREL) 50 MG tablet 1-2 tabs as needed to help with sleep 30 tablet 3  . TRUEplus Lancets 28G MISC 1 each by Does not apply route in the morning and at bedtime. 100 each 1  . [DISCONTINUED] potassium chloride (K-DUR) 10 MEQ tablet Take 1 tablet (10 mEq total) by mouth daily. (Patient not taking: Reported on 02/19/2016) 10 tablet 0   No current facility-administered medications on file prior to visit.    Observations/Objective: Awake, alert, oriented x3 Teary during encounter  Lab Results  Component Value Date   HGBA1C 9.8 (A) 11/03/2019  Assessment and Plan: 1. Insomnia, unspecified type Uncontrolled Trazodone dose increase Sleep hygiene recommended - traZODone (DESYREL) 100 MG tablet; Take 1 tablet (100 mg total) by mouth at bedtime.  Dispense: 30 tablet; Refill: 3  2. Type 2 diabetes mellitus with hyperglycemia, without long-term current use of insulin (HCC) Uncontrolled with A1c of 9.8 Unable to tolerate Metformin due to GI side effects Glipizide initiated Counseled on Diabetic diet, my plate method, 445 minutes of moderate  intensity exercise/week Blood sugar logs with fasting goals of 80-120 mg/dl, random of less than 180 and in the event of sugars less than 60 mg/dl or greater than 400 mg/dl encouraged to notify the clinic. Advised on the need for annual eye exams, annual foot exams, Pneumonia vaccine. - glipiZIDE (GLUCOTROL) 5 MG tablet; Take 1 tablet (5 mg total) by mouth 2 (two) times daily before a meal.  Dispense: 60 tablet; Refill: 3  3. GAD (generalized anxiety disorder) Uncontrolled Initiated BuSpar Continue Zoloft We will place on LCSW schedule - busPIRone (BUSPAR) 7.5 MG tablet; Take 1 tablet (7.5 mg total) by mouth 2 (two) times daily.  Dispense: 60 tablet; Refill: 3   Follow Up Instructions: Return in about 6 weeks (around 02/01/2020) for Follow-up on anxiety and depression, DM; place on LCSW schedule.    I discussed the assessment and treatment plan with the patient. The patient was provided an opportunity to ask questions and all were answered. The patient agreed with the plan and demonstrated an understanding of the instructions.   The patient was advised to call back or seek an in-person evaluation if the symptoms worsen or if the condition fails to improve as anticipated.     I provided 18 minutes total of non-face-to-face time during this encounter including median intraservice time, reviewing previous notes, investigations, ordering medications, medical decision making, coordinating care and patient verbalized understanding at the end of the visit.     Charlott Rakes, MD, FAAFP. [facility] [place], [place]   12/21/2019, 3:10 PM

## 2019-12-21 NOTE — Progress Notes (Signed)
States that metformin in making her use the bathroom too much.  Wants to talk with a therapist. States that she is having anxiety and depression problems.

## 2019-12-22 ENCOUNTER — Telehealth: Payer: Self-pay | Admitting: Licensed Clinical Social Worker

## 2019-12-22 ENCOUNTER — Telehealth: Payer: Self-pay | Admitting: *Deleted

## 2019-12-22 DIAGNOSIS — K0889 Other specified disorders of teeth and supporting structures: Secondary | ICD-10-CM

## 2019-12-22 MED FILL — busPIRone HCL 7.5 MG TABS: 7.5 | 30 days supply | Qty: 60 | Fill #0

## 2019-12-22 MED FILL — ?GLIPIZIDE 5MG TABLET: 5 | 30 days supply | Qty: 60 | Fill #0

## 2019-12-22 MED FILL — ?TRAZODONE HCL 100MG TABLE: 100 | 30 days supply | Qty: 30 | Fill #0

## 2019-12-22 NOTE — Telephone Encounter (Signed)
Done

## 2019-12-22 NOTE — Addendum Note (Signed)
Addended byCharlott Rakes on: 12/22/2019 06:18 PM   Modules accepted: Orders

## 2019-12-22 NOTE — Telephone Encounter (Signed)
Patient requested a referral to dental. Patient has orange card.

## 2019-12-22 NOTE — Telephone Encounter (Signed)
Follow up call placed to patient. Patient shared that appointment with PCP went very well, medications were adjusted and pt picked them up today, 12/22/19. Follow up appointment with patient was scheduled for 12/29/2019. No additional concerns noted.

## 2019-12-22 NOTE — Telephone Encounter (Signed)
Patient states that her teeth are lose and falling out.

## 2019-12-23 NOTE — Telephone Encounter (Signed)
Patient was called and informed of dental referral being placed.

## 2019-12-29 ENCOUNTER — Ambulatory Visit: Payer: Self-pay | Attending: Family Medicine | Admitting: Licensed Clinical Social Worker

## 2019-12-29 ENCOUNTER — Other Ambulatory Visit: Payer: Self-pay

## 2019-12-29 DIAGNOSIS — F331 Major depressive disorder, recurrent, moderate: Secondary | ICD-10-CM

## 2019-12-29 DIAGNOSIS — F411 Generalized anxiety disorder: Secondary | ICD-10-CM

## 2019-12-29 NOTE — BH Specialist Note (Signed)
[place] Visit via Telemedicine (Telephone)  12/29/2019 Karen Whitehead [id number]   Session Start time: 11:00 AM  Session End time: 11:20 AM Total time: 20 minutes  Referring Provider: Dr. Margarita Rana Type of Visit: Telephonic Patient location: Home [facility] Provider location: Office All persons participating in visit: LCSW and Patient   Discussed confidentiality: Yes   "By engaging in this telephone visit, you consent to the provision of healthcare.  Additionally, you authorize for your insurance to be billed for the services provided during this telephone visit."   Patient and/or legal guardian consented to telephone visit: Yes   PRESENTING CONCERNS: Patient and/or family reports the following symptoms/concerns: Pt has difficulty managing depression and anxiety symptoms. Pt is concerned about low energy, feelings of sadness, constant shaking, decrease in appetite, and panic attacks since medication adjustments. Duration of problem: Ongoing; Severity of problem: moderate  STRENGTHS (Protective Factors/Coping Skills): Pt has social connections Pt has emotional competency   GOALS ADDRESSED: Patient will: 1.  Reduce symptoms of: anxiety and depression  2.  Increase knowledge and/or ability of: self-management skills  3.  Demonstrate ability to: Increase healthy adjustment to current life circumstances and Increase adequate support systems for patient/family   PROGRESS TOWARDS GOALS: Ongoing  INTERVENTIONS: Interventions utilized:  Solution-Focused Strategies and Supportive Counseling Standardized Assessments completed: Not Needed  ASSESSMENT: Patient currently experiencing difficulty managing depression and anxiety symptoms.    Patient may benefit from continued therapy and medication management. Pt reports feelings of guilt and negative thoughts of self due to adult son providing increase in emotional and financial support. LCSW assisted pt in identifying how  identified thought patterns negatively impact mood and reported symptoms. Strategies to decrease negative thoughts discussed. Pt agreed to continue utilizing meditation and talking with son as coping skills. She reports compliance with prescribed medications.  LCSW strongly encouraged patient to visit [facility] for crisis intervention, if needed. Pt is open to referral to initiate therapy and medication management.   PLAN: 1. Follow up with behavioral health clinician on : 01/04/2020 2. Behavioral recommendations: Continue to utilize healthy coping skills, continue with medication management, and visit [facility] in the event of crisis 3. Referral(s): [place] (In Clinic) and [place] (LME/Outside Clinic)  Rebekah Chesterfield, [place] 01/17/2020 6:07 AM   Confirmed patient's address: Yes  Confirmed patient's phone number: Yes  Any changes to demographics: No   Confirmed patient's insurance: Yes  Any changes to patient's insurance: No    The following statements were read to the patient and/or legal guardian that are established with the [facility] Provider.  "The purpose of this phone visit is to provide behavioral health care while limiting exposure to the coronavirus (COVID19).  There is a possibility of technology failure and discussed alternative modes of communication if that failure occurs."

## 2020-01-04 ENCOUNTER — Ambulatory Visit: Payer: Self-pay | Attending: Family Medicine | Admitting: Licensed Clinical Social Worker

## 2020-01-04 ENCOUNTER — Other Ambulatory Visit: Payer: Self-pay

## 2020-01-04 DIAGNOSIS — F411 Generalized anxiety disorder: Secondary | ICD-10-CM

## 2020-01-04 DIAGNOSIS — F331 Major depressive disorder, recurrent, moderate: Secondary | ICD-10-CM

## 2020-01-04 MED FILL — SERTRALINE HCL 100 MG TAB: 100 | 30 days supply | Qty: 30 | Fill #1

## 2020-01-17 ENCOUNTER — Telehealth: Payer: Self-pay | Admitting: General Practice

## 2020-01-17 MED ORDER — METFORMIN HCL 500 MG PO TABS
1000.0000 mg | ORAL_TABLET | Freq: Two times a day (BID) | ORAL | 3 refills | Status: DC
Start: 2020-01-17 — End: 2020-05-09

## 2020-01-17 NOTE — Telephone Encounter (Signed)
She complained of diarrhea with Metformin at her last visit.  I have discontinued glipizide and restarted Metformin per her request.

## 2020-01-17 NOTE — Telephone Encounter (Signed)
Copied from [place] [id number]. Topic: General - Other >> Jan 17, 2020 11:36 AM Leward Quan A wrote: Reason for CRM: Patient called to ask Freeman Caldron to please put her back on the metFORMIN (GLUCOPHAGE) 1000 MG tablet because the glipiZIDE (GLUCOTROL) 5 MG tablet make her shake too much. Also asking Christa See to call her to schedule an appointment. Please call Ph# [phone]

## 2020-01-17 NOTE — Telephone Encounter (Signed)
Will forward to pcp

## 2020-01-17 NOTE — Telephone Encounter (Signed)
Will forward to nurse 

## 2020-01-18 MED FILL — METFORMIN HCL 500 MG TABS: 500 | 30 days supply | Qty: 120 | Fill #0

## 2020-01-18 MED FILL — ?GLIPIZIDE 5MG TABLET: 5 | 30 days supply | Qty: 60 | Fill #1

## 2020-01-18 MED FILL — busPIRone HCL 7.5 MG TABS: 7.5 | 30 days supply | Qty: 60 | Fill #1

## 2020-01-18 MED FILL — ?TRAZODONE HCL 100MG TABLE: 100 | 30 days supply | Qty: 30 | Fill #1

## 2020-01-18 NOTE — Telephone Encounter (Signed)
Patient was called and informed of medication change  

## 2020-01-24 ENCOUNTER — Telehealth: Payer: Self-pay | Admitting: Licensed Clinical Social Worker

## 2020-01-24 DIAGNOSIS — F331 Major depressive disorder, recurrent, moderate: Secondary | ICD-10-CM

## 2020-01-24 NOTE — Telephone Encounter (Signed)
Referral has been placed. 

## 2020-01-24 NOTE — Telephone Encounter (Signed)
Follow up call placed to patient. Pt shared that she is compliant with medications to address anxiety and depression;however, reports symptoms continue to fluctuate. Pt is agreeable to internal referral to [facility]. PCP notified.   IBH appointment with LCSW scheduled for Monday, September 27, 21. Additional coping strategies and resources to address transportation barriers will be discussed.

## 2020-01-24 NOTE — BH Specialist Note (Signed)
[facility] Visit via Telemedicine (Telephone)  01/04/2020 Karen Whitehead [id number]  Number of [place] visits: 5 Session Start time: 9:00 AM  Session End time: 9:20 AM Total time: 20 minutes  Referring Provider: Dr. Margarita Rana Type of Service: Individual Patient location: Home [facility] Provider location: Office All persons participating in visit: LCSW and patient   I connected with Karen Whitehead by telephone and verified that I am speaking with the correct person using two identifiers.   Discussed confidentiality: Yes   Confirmed demographics & insurance:  Yes   I discussed that engaging in this virtual visit, they consent to the provision of behavioral healthcare and the services will be billed under their insurance.   Patient and/or legal guardian expressed understanding and consented to virtual visit: Yes   PRESENTING CONCERNS: Patient reports the following symptoms/concerns: Difficulty managing depression and anxiety symptoms Duration of problem: Ongoing; Severity of problem: severe  STRENGTHS (Protective Factors/Coping Skills): Social connections, Social and Emotional competence and Concrete supports in place (healthy food, safe environments, etc.)  ASSESSMENT: Patient currently experiencing difficulty managing medical and mental health conditions. Reports adverse side effects of diabetes medication which triggers anxiety to leave the home. Healthy coping skills were discussed. Pt agreed to go outside at least 3 x weekly and/or open blinds for sunlight to promote positive emotions.    GOALS ADDRESSED: Patient will: 1.  Increase knowledge and/or ability of: coping skills    Progress of Goals: Ongoing  INTERVENTIONS: Interventions utilized:  Solution-Focused Strategies Standardized Assessments completed & reviewed: Not Needed   OUTCOME: Patient Response: Pt was engaged during session. Reports "feeling better" after speaking with  LCSW   PLAN: 1. Follow up with behavioral health clinician on : Schedule follow up appt 2. Behavioral recommendations: Continue to comply with prescribed medications and utilize strategies discussed. Visit [place], in the event of crisis 3. Referral(s): [place] (In Clinic)  I discussed the assessment and treatment plan with the patient and/or parent/guardian. They were provided an opportunity to ask questions and all were answered. They agreed with the plan and demonstrated an understanding of the instructions.   They were advised to call back or seek an in-person evaluation as appropriate.  I discussed that the purpose of this visit is to provide behavioral health care while limiting exposure to the novel coronavirus.  Discussed there is a possibility of technology failure and discussed alternative modes of communication if that failure occurs.  Rebekah Chesterfield, LCSW 01/24/2020 12:17 PM

## 2020-01-30 ENCOUNTER — Ambulatory Visit: Payer: Self-pay | Attending: Family Medicine | Admitting: Licensed Clinical Social Worker

## 2020-01-30 DIAGNOSIS — F411 Generalized anxiety disorder: Secondary | ICD-10-CM

## 2020-01-30 NOTE — BH Specialist Note (Signed)
Follow up call placed to patient.   Patient shared that today has been "pretty good" noting that she has not experienced a panic attack today. Pt is feeling appreciative for the referral to [facility], pt has an upcoming appointment with psychiatry tomorrow, September 28, 21 and with her assigned therapist, November 10, 21   Both appointments will be conducted virtually, therefore, transportation will not be a barrier. Currently, pt's family is visiting her. LCSW scheduled follow up appointment for October 6, 21. No additional concerns noted.

## 2020-01-31 ENCOUNTER — Other Ambulatory Visit: Payer: Self-pay

## 2020-01-31 ENCOUNTER — Ambulatory Visit (INDEPENDENT_AMBULATORY_CARE_PROVIDER_SITE_OTHER): Payer: No Payment, Other | Admitting: Licensed Clinical Social Worker

## 2020-01-31 DIAGNOSIS — F418 Other specified anxiety disorders: Secondary | ICD-10-CM | POA: Diagnosis not present
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
## 2020-02-06 ENCOUNTER — Ambulatory Visit: Payer: Self-pay | Attending: Family Medicine | Admitting: Licensed Clinical Social Worker

## 2020-02-06 ENCOUNTER — Encounter: Payer: Self-pay | Admitting: Family Medicine

## 2020-02-06 ENCOUNTER — Other Ambulatory Visit: Payer: Self-pay

## 2020-02-06 ENCOUNTER — Ambulatory Visit: Payer: Self-pay | Attending: Family Medicine | Admitting: Family Medicine

## 2020-02-06 ENCOUNTER — Other Ambulatory Visit: Payer: Self-pay | Admitting: Family Medicine

## 2020-02-06 VITALS — BP 111/73 | HR 69 | Ht 64.0 in | Wt 202.0 lb

## 2020-02-06 DIAGNOSIS — Z1159 Encounter for screening for other viral diseases: Secondary | ICD-10-CM

## 2020-02-06 DIAGNOSIS — G4700 Insomnia, unspecified: Secondary | ICD-10-CM

## 2020-02-06 DIAGNOSIS — F331 Major depressive disorder, recurrent, moderate: Secondary | ICD-10-CM

## 2020-02-06 DIAGNOSIS — L920 Granuloma annulare: Secondary | ICD-10-CM

## 2020-02-06 DIAGNOSIS — M2550 Pain in unspecified joint: Secondary | ICD-10-CM

## 2020-02-06 DIAGNOSIS — E1169 Type 2 diabetes mellitus with other specified complication: Secondary | ICD-10-CM

## 2020-02-06 DIAGNOSIS — M791 Myalgia, unspecified site: Secondary | ICD-10-CM

## 2020-02-06 LAB — POCT GLYCOSYLATED HEMOGLOBIN (HGB A1C): HbA1c, POC (controlled diabetic range): 6.1 % (ref 0.0–7.0)

## 2020-02-06 LAB — GLUCOSE, POCT (MANUAL RESULT ENTRY): POC Glucose: 107 mg/dl — AB (ref 70–99)

## 2020-02-06 MED ORDER — TRAZODONE HCL 150 MG PO TABS: 150.0000 mg | ORAL_TABLET | Freq: Every day | ORAL | 6 refills | Status: DC

## 2020-02-06 MED ORDER — MELOXICAM 7.5 MG PO TABS: 7.5000 mg | ORAL_TABLET | Freq: Every day | ORAL | 0 refills | Status: DC

## 2020-02-06 MED ORDER — ATORVASTATIN CALCIUM 20 MG PO TABS: 20.0000 mg | ORAL_TABLET | Freq: Every day | ORAL | 3 refills | Status: DC

## 2020-02-06 MED ORDER — SERTRALINE HCL 100 MG PO TABS: 100.0000 mg | ORAL_TABLET | Freq: Every day | ORAL | 3 refills | Status: DC

## 2020-02-06 MED ORDER — CYCLOBENZAPRINE HCL 10 MG PO TABS: 10.0000 mg | ORAL_TABLET | Freq: Every day | ORAL | 1 refills | Status: DC

## 2020-02-06 MED FILL — CYCLOBENZAPRINE 10 MG TAB: 10 | 30 days supply | Qty: 30 | Fill #0

## 2020-02-06 MED FILL — ATORVASTATIN CALCIUM 20 MG: 20 | 30 days supply | Qty: 30 | Fill #0

## 2020-02-06 MED FILL — SERTRALINE HCL 100 MG TAB: 100 | 30 days supply | Qty: 30 | Fill #0

## 2020-02-06 MED FILL — ?TRAZODONE HCL 150 MG TABL: 150 | 30 days supply | Qty: 30 | Fill #0

## 2020-02-06 MED FILL — MELOXICAM 7.5 MG TABLET: 7.5 | 30 days supply | Qty: 30 | Fill #0

## 2020-02-06 NOTE — Progress Notes (Signed)
Still having joint and muscle pains.

## 2020-02-06 NOTE — Progress Notes (Signed)
Subjective:  Patient ID: Karen Whitehead, female    DOB: 1965/10/23  Age: 54 y.o. MRN: [id number]  CC: Diabetes   HPI Karen Whitehead 54 year olf female with Type2 DM (A1c 6.1), anxiety and depression, insomnia who presents today for follow-up of anxiety. She was seen by the licensed clinical social worker and had requested a referral to psychiatry prior to today's visit and referral had been placed.  She has had a visit for counseling sessions but will not be seeing the psychiatrist until next month.  Complains of L knee pain, elbow pain, muscles tighten up and symptoms have been present since she had Dengue fever 3 years ago while in the [place] but her muscle relaxant have been beneficial Her muscles will typically tighten up in her right forearm and right leg.  She denies presence of swelling in her joints and has no fevers. Of note she does have a rash which was diagnosed as granuloma annulare by dermatology and she is currently on topical steroids for this.  Complains of Insomnia which is uncontrolled on Trazodone. With regards to her diabetes mellitus A1c 6.7 improved from 9.8 previously and at her last visit she had requested switching from Metformin to glipizide but then subsequently called back wanting to switch from glipizide back to Metformin due to feeling sick on glipizide.   Past Medical History:  Diagnosis Date  . Granuloma annulare     Past Surgical History:  Procedure Laterality Date  . APPENDECTOMY      History reviewed. No pertinent family history.  No Known Allergies  Outpatient Medications Prior to Visit  Medication Sig Dispense Refill  . albuterol (VENTOLIN HFA) 108 (90 Base) MCG/ACT inhaler Inhale 1-2 puffs into the lungs every 6 (six) hours as needed for wheezing or shortness of breath. 18 g 1  . Blood Glucose Monitoring Suppl (TRUE METRIX METER) w/Device KIT 1 each by Does not apply route in the morning and at bedtime. 1 kit 0  . busPIRone  (BUSPAR) 7.5 MG tablet Take 1 tablet (7.5 mg total) by mouth 2 (two) times daily. 60 tablet 3  . fluticasone (CUTIVATE) 0.005 % ointment Apply 1 application topically 2 (two) times daily.    Marland Kitchen glucose blood (TRUE METRIX BLOOD GLUCOSE TEST) test strip Use as instructed 100 each 12  . metFORMIN (GLUCOPHAGE) 500 MG tablet Take 2 tablets (1,000 mg total) by mouth 2 (two) times daily with a meal. 120 tablet 3  . TRUEplus Lancets 28G MISC 1 each by Does not apply route in the morning and at bedtime. 100 each 1  . cyclobenzaprine (FLEXERIL) 10 MG tablet Take 1 tablet (10 mg total) by mouth at bedtime. Prn muscle pain 30 tablet 1  . methocarbamol (ROBAXIN) 500 MG tablet Take 2 tablets (1,000 mg total) by mouth every 8 (eight) hours as needed for muscle spasms. 90 tablet 2  . sertraline (ZOLOFT) 100 MG tablet Take 1 tablet (100 mg total) by mouth daily. 30 tablet 3  . traZODone (DESYREL) 100 MG tablet Take 1 tablet (100 mg total) by mouth at bedtime. 30 tablet 3   No facility-administered medications prior to visit.     ROS Review of Systems  Constitutional: Negative for activity change, appetite change and fatigue.  HENT: Negative for congestion, sinus pressure and sore throat.   Eyes: Negative for visual disturbance.  Respiratory: Negative for cough, chest tightness, shortness of breath and wheezing.   Cardiovascular: Negative for chest pain and palpitations.  Gastrointestinal: Negative for abdominal distention, abdominal pain and constipation.  Endocrine: Negative for polydipsia.  Genitourinary: Negative for dysuria and frequency.  Musculoskeletal: Negative for arthralgias and back pain.       See hpi   Skin: Positive for rash.  Neurological: Negative for tremors, light-headedness and numbness.  Hematological: Does not bruise/bleed easily.  Psychiatric/Behavioral: Positive for dysphoric mood. Negative for agitation and behavioral problems.    Objective:  BP 111/73   Pulse 69   Ht _0   (1.626 m)   Wt 202 lb (91.6 kg)   SpO2 96%   BMI 34.67 kg/m   BP/Weight 02/06/2020 0/07/7046 12/11/9167  Systolic BP 450 388 828  Diastolic BP 73 71 82  Wt. (Lbs) 202 216 221  BMI 34.67 37.08 37.93      Physical Exam Constitutional:      Appearance: She is well-developed.  Neck:     Vascular: No JVD.  Cardiovascular:     Rate and Rhythm: Normal rate.     Heart sounds: Normal heart sounds. No murmur heard.   Pulmonary:     Effort: Pulmonary effort is normal.     Breath sounds: Normal breath sounds. No wheezing or rales.  Chest:     Chest wall: No tenderness.  Abdominal:     General: Bowel sounds are normal. There is no distension.     Palpations: Abdomen is soft. There is no mass.     Tenderness: There is no abdominal tenderness.  Musculoskeletal:     Right lower leg: No edema.     Left lower leg: No edema.     Comments: Slight left knee edema with associated crepitus on range of motion Bilateral elbows and wrists normal with no evidence of edema or tenderness on palpation  Skin:    Comments: Well-circumscribed confluent rash on upper extremities, neck  Neurological:     Mental Status: She is alert and oriented to person, place, and time.  Psychiatric:        Mood and Affect: Mood normal.     CMP Latest Ref Rng & Units 10/16/2019 10/16/2019 02/19/2016  Glucose 70 - 99 mg/dL - 153(H) 111(H)  BUN 6 - 20 mg/dL - 7 7  Creatinine 0.44 - 1.00 mg/dL - 0.87 0.87  Sodium 135 - 145 mmol/L 139 138 139  Potassium 3.5 - 5.1 mmol/L 3.4(L) 3.5 4.0  Chloride 98 - 111 mmol/L - 100 106  CO2 22 - 32 mmol/L - 24 25  Calcium 8.9 - 10.3 mg/dL - 9.8 9.8    Lipid Panel  No results found for: CHOL, TRIG, HDL, CHOLHDL, VLDL, LDLCALC, LDLDIRECT  CBC    Component Value Date/Time   WBC 10.5 10/16/2019 1515   RBC 4.82 10/16/2019 1515   HGB 15.6 (H) 10/16/2019 1751   HCT 46.0 10/16/2019 1751   PLT 289 10/16/2019 1515   MCV 92.5 10/16/2019 1515   MCH 31.5 10/16/2019 1515   MCHC 34.1  10/16/2019 1515   RDW 12.6 10/16/2019 1515   LYMPHSABS 3.0 11/10/2018 1315   MONOABS 0.6 11/10/2018 1315   EOSABS 0.3 11/10/2018 1315   BASOSABS 0.1 11/10/2018 1315    Lab Results  Component Value Date   HGBA1C 6.1 02/06/2020    Assessment & Plan:  1. Type 2 diabetes mellitus with other specified complication, without long-term current use of insulin (HCC) Uncontrolled with A1c of 6.1 down from 9.8; goal is less than 7.0 She has been commended on improvement Continue current regimen Statin initiated  for primary prevention - POCT glucose (manual entry) - POCT glycosylated hemoglobin (Hb A1C) - sertraline (ZOLOFT) 100 MG tablet; Take 1 tablet (100 mg total) by mouth daily.  Dispense: 30 tablet; Refill: 3 - Microalbumin / creatinine urine ratio - atorvastatin (LIPITOR) 20 MG tablet; Take 1 tablet (20 mg total) by mouth daily.  Dispense: 30 tablet; Refill: 3  2. Moderate episode of recurrent major depressive disorder ([place]) Uncontrolled Currently followed by behavioral health - sertraline (ZOLOFT) 100 MG tablet; Take 1 tablet (100 mg total) by mouth daily.  Dispense: 30 tablet; Refill: 3  3. Muscle pain Stable with intermittent flares Doing well on muscle relaxants Meloxicam added to regimen - cyclobenzaprine (FLEXERIL) 10 MG tablet; Take 1 tablet (10 mg total) by mouth at bedtime. Prn muscle pain  Dispense: 30 tablet; Refill: 1  4. Insomnia, unspecified type Uncontrolled Increase trazodone dose from 100 mg to 150 mg - traZODone (DESYREL) 150 MG tablet; Take 1 tablet (150 mg total) by mouth at bedtime.  Dispense: 30 tablet; Refill: 6  5. Screening for viral disease - HCV RNA quant rflx ultra or genotyp([facility]/Sunquest) - Hepatitis B surface antibody,qualitative - Hepatitis B surface antigen  6. Arthralgia, unspecified joint Symptoms unlikely due to dengue fever this patient stipulates test this is far out Will order labs to exclude autoimmune condition - meloxicam (MOBIC)  7.5 MG tablet; Take 1 tablet (7.5 mg total) by mouth daily.  Dispense: 30 tablet; Refill: 0 - ANA,IFA RA Diag Pnl w/rflx Tit/Patn - Anti-DNA antibody, double-stranded - Sedimentation Rate - C-reactive protein - Sjogren's syndrome antibods(ssa + ssb) - Anti-Smith antibody  7. Granuloma annulare Uncontrolled Currently on topical steroids as per dermatology   Health Care Maintenance: We will address at subsequent visit as at this time she still has uncontrolled depression.  Meds ordered this encounter  Medications  . sertraline (ZOLOFT) 100 MG tablet    Sig: Take 1 tablet (100 mg total) by mouth daily.    Dispense:  30 tablet    Refill:  3  . cyclobenzaprine (FLEXERIL) 10 MG tablet    Sig: Take 1 tablet (10 mg total) by mouth at bedtime. Prn muscle pain    Dispense:  30 tablet    Refill:  1    Use methocarbamol in daytime  . atorvastatin (LIPITOR) 20 MG tablet    Sig: Take 1 tablet (20 mg total) by mouth daily.    Dispense:  30 tablet    Refill:  3  . traZODone (DESYREL) 150 MG tablet    Sig: Take 1 tablet (150 mg total) by mouth at bedtime.    Dispense:  30 tablet    Refill:  6  . meloxicam (MOBIC) 7.5 MG tablet    Sig: Take 1 tablet (7.5 mg total) by mouth daily.    Dispense:  30 tablet    Refill:  0    Follow-up: Return in about 3 months (around 05/08/2020) for chronic medical conditions.       Charlott Rakes, MD, FAAFP. [facility] and [facility] [place], [place]   02/06/2020, 1:18 PM

## 2020-02-06 NOTE — Patient Instructions (Signed)

## 2020-02-08 ENCOUNTER — Ambulatory Visit: Payer: Self-pay | Admitting: Licensed Clinical Social Worker

## 2020-02-08 LAB — MICROALBUMIN / CREATININE URINE RATIO
Creatinine, Urine: 116.3 mg/dL
Microalb/Creat Ratio: 34 mg/g creat — ABNORMAL HIGH (ref 0–29)
Microalbumin, Urine: 39.4 ug/mL

## 2020-02-08 LAB — HEPATITIS B SURFACE ANTIBODY,QUALITATIVE: Hep B Surface Ab, Qual: NONREACTIVE

## 2020-02-08 LAB — ANTI-SMITH ANTIBODY: ENA SM Ab Ser-aCnc: <0.2 AI (ref 0.0–0.9)

## 2020-02-08 LAB — SJOGREN'S SYNDROME ANTIBODS(SSA + SSB)
ENA SSA (RO) Ab: <0.2 AI (ref 0.0–0.9)
ENA SSB (LA) Ab: <0.2 AI (ref 0.0–0.9)

## 2020-02-08 LAB — HCV RNA QUANT RFLX ULTRA OR GENOTYP: HCV Quant Baseline: NOT DETECTED IU/mL

## 2020-02-08 LAB — ANA,IFA RA DIAG PNL W/RFLX TIT/PATN
ANA Titer 1: NEGATIVE
Cyclic Citrullin Peptide Ab: 4 units (ref 0–19)
Rheumatoid fact SerPl-aCnc: <10 IU/mL (ref 0.0–13.9)

## 2020-02-08 LAB — HEPATITIS B SURFACE ANTIGEN: Hepatitis B Surface Ag: NEGATIVE

## 2020-02-08 LAB — SEDIMENTATION RATE: Sed Rate: 5 mm/hr (ref 0–40)

## 2020-02-08 LAB — ANTI-DNA ANTIBODY, DOUBLE-STRANDED: dsDNA Ab: <1 IU/mL (ref 0–9)

## 2020-02-08 LAB — C-REACTIVE PROTEIN: CRP: 7 mg/L (ref 0–10)

## 2020-02-22 ENCOUNTER — Other Ambulatory Visit: Payer: Self-pay

## 2020-02-22 ENCOUNTER — Ambulatory Visit (INDEPENDENT_AMBULATORY_CARE_PROVIDER_SITE_OTHER): Payer: No Payment, Other | Admitting: Licensed Clinical Social Worker

## 2020-02-22 DIAGNOSIS — F418 Other specified anxiety disorders: Secondary | ICD-10-CM | POA: Diagnosis not present

## 2020-02-24 NOTE — Progress Notes (Signed)
   THERAPIST PROGRESS NOTE   Virtual Visit via Telephone Note  I connected with Karen Whitehead on 02/22/20 at  8:00 AM EDT by telephone and verified that I am speaking with the correct person using two identifiers.  Location: Patient: Home Provider: [facility]   I discussed the limitations, risks, security and privacy concerns of performing an evaluation and management service by telephone and the availability of in person appointments. I also discussed with the patient that there may be a patient responsible charge related to this service. The patient expressed understanding and agreed to proceed. I discussed the assessment and treatment plan with the patient. The patient was provided an opportunity to ask questions and all were answered. The patient agreed with the plan and demonstrated an understanding of the instructions.    I provided 60minutes of non-face-to-face time during this encounter.  Participation Level: Active  Behavioral Response: CasualAlertDepressed and Dysphoric  Type of Therapy: Individual Therapy  Treatment Goals addressed: Communication: Depression and coping  Interventions: Motivational Interviewing and Supportive  Summary: Karen Whitehead is a 54 y.o. female who presents with hx of dep/anx. This date pt signs on for video session per her preference. Pt can see and hear LCSW. LCSW can see pt but there is no audio. Pt is agreeable to proceed with telephone only for this session. Pt reports she is doing very poorly with both dep and anx. She states "This has been such a hard week". She cannot identify any particular stressor or trigger escalating her feelings. She is intermittently tearful throughout session and says "I have never been this bad and I don't know why". She states "My brain is so scattered" and says she has not been out of her home all wk. Pt denies SI/HI. She states her son continues to be extremely supportive. She is talking with her sister daily. Sister will  be back in town in Feb. Pt reports she did have a recent appt with PCP who ordered blood work and all is "good". She is struggling much with her chronic skin condition and states nothing provided by dermatologist has been helpful. Pt taking all meds as prescribed. She reports she is sleeping ~ 4.5 hrs which is an improvement. LCSW provided active listening and support. Remainder of session devoted to addressing coping strategies. LCSW strongly encouraged pt to incorporate exercise. Pt states she does have some problems with L knee but use to love to walk and will exercise as tolerated. Introduced the Daily Calm to add to pt's meditation regimen. LCSW reviewed poc prior to close of session with pt's verbal acceptance. Pt states appreciation for care.    Suicidal/Homicidal: Nowithout intent/plan  Therapist Response: Pt remains receptive to care.  Plan: Return again in 2 weeks.  Diagnosis: Axis I: Depression with anxiety    Axis II: Deferred  Hermine Messick, LCSW 02/24/2020

## 2020-02-27 MED FILL — METFORMIN HCL 500 MG TABS: 500 | 30 days supply | Qty: 120 | Fill #1

## 2020-02-27 NOTE — BH Specialist Note (Signed)
LCSW met with patient after visit with PCP. Reports continued symptoms of anxiety attacks and difficulty obtaining sleep. Pt has established with [facility]. Currently she is utilizing relaxing sounds (I.e. water) to assist in coping with anxiety, in addition, to continued medication management through PCP. Pt agreed to continue utilizing healthy coping skills identified and participate in services.

## 2020-03-07 ENCOUNTER — Ambulatory Visit (INDEPENDENT_AMBULATORY_CARE_PROVIDER_SITE_OTHER): Payer: No Payment, Other | Admitting: Licensed Clinical Social Worker

## 2020-03-07 ENCOUNTER — Other Ambulatory Visit: Payer: Self-pay

## 2020-03-07 DIAGNOSIS — F418 Other specified anxiety disorders: Secondary | ICD-10-CM

## 2020-03-08 NOTE — Progress Notes (Signed)
   THERAPIST PROGRESS NOTE   Virtual Visit via Telephone Note  I connected with Karen Whitehead on 03/07/20 at  8:00 AM EDT by telephone and verified that I am speaking with the correct person using two identifiers.  Location: Patient: Home Provider: [facility]   I discussed the limitations, risks, security and privacy concerns of performing an evaluation and management service by telephone and the availability of in person appointments. I also discussed with the patient that there may be a patient responsible charge related to this service. The patient expressed understanding and agreed to proceed. I discussed the assessment and treatment plan with the patient. The patient was provided an opportunity to ask questions and all were answered. The patient agreed with the plan and demonstrated an understanding of the instructions.   I provided 38 minutes of non-face-to-face time during this encounter.  Participation Level: Active  Behavioral Response: UTA phone onlyAlertDepressed and Dysphoric  Type of Therapy: Individual Therapy  Treatment Goals addressed: Communication: Dep/anx/coping  Interventions: CBT, Motivational Interviewing and Supportive  Summary: Karen Whitehead is a 54 y.o. female who presents with hx of dep/anx. This date pt has a phone only session per her preference. LCSW assessed overall status and coping since last session. Pt reports she is "some better but still so low". Continues to heavily isolate. She states she has not been able to go out walking but will try. Pt is not tearful this session whereas she cried almost entire session last session. She also advises her anx is improved but still a problem. She states she was having 2-3 anx attacks per day and now she has about 1 per day. She reports they are not as frequent but more intense and come on suddenly with no apparent trigger/pattern. She states she feels like she is having a heart attack. Pt reports to calm herself she is  doing deep breathing, tries to distract herself with positive thoughts and sings the song "Jesus Loves Me", does meditation. Pt is taking meds as prescribed but still not sleeping that well even with meds. Her next med appt is 11/10. LCSW encouraged pt to be outspoken about all of her symptoms. Pt noted to have neg self talk on a regular basis. LCSW reviewed education on the power of self talk and it's role in feelings/behavior. LCSW facilitated mailing of self talk literature to address further next session. LCSW explained gratefulness project which pt will try to begin. Advised on book: Getting Past Your Past. Reviewed poc with pt's verbal acceptance prior to close of session. Pt states appreciation for care.     Suicidal/Homicidal: Nowithout intent/plan  Therapist Response: Pt remains receptive to care.  Plan: Return again in 2 weeks.  Diagnosis: Axis I: Depression with anxiety    Axis II: Deferred  Hermine Messick, LCSW 03/08/2020

## 2020-03-13 MED FILL — ALBUTEROL SULFATE HFA 108 (: 108 (90 BAS | 25 days supply | Qty: 18 | Fill #1

## 2020-03-13 MED FILL — SERTRALINE HCL 100 MG TAB: 100 | 30 days supply | Qty: 30 | Fill #2

## 2020-03-13 MED FILL — ?TRAZODONE HCL 150 MG TABL: 150 | 30 days supply | Qty: 30 | Fill #1

## 2020-03-13 MED FILL — ATORVASTATIN CALCIUM 20 MG: 20 | 30 days supply | Qty: 30 | Fill #1

## 2020-03-13 MED FILL — busPIRone HCL 7.5 MG TABS: 7.5 | 30 days supply | Qty: 60 | Fill #2

## 2020-03-14 ENCOUNTER — Encounter (HOSPITAL_COMMUNITY): Payer: Self-pay | Admitting: Psychiatry

## 2020-03-14 ENCOUNTER — Telehealth (INDEPENDENT_AMBULATORY_CARE_PROVIDER_SITE_OTHER): Payer: No Payment, Other | Admitting: Psychiatry

## 2020-03-14 ENCOUNTER — Other Ambulatory Visit: Payer: Self-pay

## 2020-03-14 DIAGNOSIS — F339 Major depressive disorder, recurrent, unspecified: Secondary | ICD-10-CM | POA: Insufficient documentation

## 2020-03-14 MED ORDER — CLONAZEPAM 0.5 MG PO TABS: 0.5000 mg | ORAL_TABLET | Freq: Two times a day (BID) | ORAL | 1 refills | Status: DC

## 2020-03-14 MED ORDER — SERTRALINE HCL 100 MG PO TABS: ORAL_TABLET | ORAL | 1 refills | Status: DC

## 2020-03-14 NOTE — Progress Notes (Signed)
Psychiatric Initial Adult Assessment   Patient Identification: Karen Whitehead MRN:  [id number] Date of Evaluation:  03/14/2020   Referral Source: Dr. Charlane Ferretti  Chief Complaint:   " I have been so bad lately, I dont know where to start."  Visit Diagnosis:    ICD-10-CM   1. Major depressive disorder, recurrent episode with anxious distress (HCC)  F33.9 sertraline (ZOLOFT) 100 MG tablet    clonazePAM (KLONOPIN) 0.5 MG tablet    History of Present Illness: This is a 54 year old female with long history of depression and anxiety now seen for evaluation and establishing care.  Patient reported that she is undergoing her stress.  She stated that she has all these health conditions including her skin condition granuloma annulare.  She stated that everything seems to be overwhelming to her.  She does not know how to calm herself down.  She reported she is feeling depressed with frequent crying spells lately.  She stated that her mind is racing all over the place all the time.  She sometimes has significant anxiety to the point that she cannot breathe.  She feels restless and cannot relax.  She feels on the edge all the time. She endorses anhedonia with poor energy levels and poor sleep.  She also reported poor appetite. She denied any active suicidal ideations however has wondered what is the point of living.  She reported that she will never do anything to hurt herself as she wants to be there for her 37 year old son. She sometimes feels helpless or hopeless. She denied any history of manic or hypomanic symptoms. She denied any delusions or hallucinations. She denied excessive consumption of alcohol or use of any illicit substances.  Patient has been taking sertraline 100 mg for more than 6 months now.  She stated that initially when she started taking the medicine it was very helpful.  She also reported that trazodone 150 mg dose does not seem to be helpful with sleep anymore.  She has taken  buspirone with some effect but not complete effect. She was agreeable to the recommendation of trying clonazepam to help her with anxiety and also adjusting the dose of sertraline to optimally control her depression anxiety symptoms. Writer recommended that we continue the same dose of trazodone for now with the hope that control of anxiety will help her sleep better at night. Patient was agreeable to this recommendation and is hopeful that she will feel better.  She has recently started seeing Ms. Hatch for therapy.  She is planning to continue that with her.   Past Psychiatric History: MDD, anxiety  Previous Psychotropic Medications: Yes   Substance Abuse History in the last 12 months:  No.  Consequences of Substance Abuse: NA  Past Medical History:  Past Medical History:  Diagnosis Date  . Granuloma annulare     Past Surgical History:  Procedure Laterality Date  . APPENDECTOMY      Family Psychiatric History: denied  Family History: History reviewed. No pertinent family history.  Social History:   Social History   Socioeconomic History  . Marital status: Single    Spouse name: Not on file  . Number of children: Not on file  . Years of education: Not on file  . Highest education level: Not on file  Occupational History  . Not on file  Tobacco Use  . Smoking status: Current Every Day Smoker    Packs/day: 0.50    Types: Cigarettes  . Smokeless tobacco: Never Used  Substance  and Sexual Activity  . Alcohol use: No  . Drug use: No  . Sexual activity: Not on file  Other Topics Concern  . Not on file  Social History Narrative  . Not on file   Social Determinants of Health   Financial Resource Strain:   . Difficulty of Paying Living Expenses: Not on file  Food Insecurity:   . Worried About Charity fundraiser in the Last Year: Not on file  . Ran Out of Food in the Last Year: Not on file  Transportation Needs:   . Lack of Transportation (Medical): Not on file   . Lack of Transportation (Non-Medical): Not on file  Physical Activity:   . Days of Exercise per Week: Not on file  . Minutes of Exercise per Session: Not on file  Stress:   . Feeling of Stress : Not on file  Social Connections:   . Frequency of Communication with Friends and Family: Not on file  . Frequency of Social Gatherings with Friends and Family: Not on file  . Attends Religious Services: Not on file  . Active Member of Clubs or Organizations: Not on file  . Attends Archivist Meetings: Not on file  . Marital Status: Not on file    Additional Social History: Lives with her 23 y/o son  Allergies:  No Known Allergies  Metabolic Disorder Labs: Lab Results  Component Value Date   HGBA1C 6.1 02/06/2020   No results found for: PROLACTIN No results found for: CHOL, TRIG, HDL, CHOLHDL, VLDL, LDLCALC No results found for: TSH  Therapeutic Level Labs: No results found for: LITHIUM No results found for: CBMZ No results found for: VALPROATE  Current Medications: Current Outpatient Medications  Medication Sig Dispense Refill  . albuterol (VENTOLIN HFA) 108 (90 Base) MCG/ACT inhaler Inhale 1-2 puffs into the lungs every 6 (six) hours as needed for wheezing or shortness of breath. 18 g 1  . atorvastatin (LIPITOR) 20 MG tablet Take 1 tablet (20 mg total) by mouth daily. 30 tablet 3  . Blood Glucose Monitoring Suppl (TRUE METRIX METER) w/Device KIT 1 each by Does not apply route in the morning and at bedtime. 1 kit 0  . busPIRone (BUSPAR) 7.5 MG tablet Take 1 tablet (7.5 mg total) by mouth 2 (two) times daily. 60 tablet 3  . clonazePAM (KLONOPIN) 0.5 MG tablet Take 1 tablet (0.5 mg total) by mouth 2 (two) times daily. 60 tablet 1  . cyclobenzaprine (FLEXERIL) 10 MG tablet Take 1 tablet (10 mg total) by mouth at bedtime. Prn muscle pain 30 tablet 1  . fluticasone (CUTIVATE) 0.005 % ointment Apply 1 application topically 2 (two) times daily.    Marland Kitchen glucose blood (TRUE METRIX  BLOOD GLUCOSE TEST) test strip Use as instructed 100 each 12  . meloxicam (MOBIC) 7.5 MG tablet Take 1 tablet (7.5 mg total) by mouth daily. 30 tablet 0  . metFORMIN (GLUCOPHAGE) 500 MG tablet Take 2 tablets (1,000 mg total) by mouth 2 (two) times daily with a meal. 120 tablet 3  . sertraline (ZOLOFT) 100 MG tablet Take one and a half tablets daily (150 mg) 45 tablet 1  . traZODone (DESYREL) 150 MG tablet Take 1 tablet (150 mg total) by mouth at bedtime. 30 tablet 6  . TRUEplus Lancets 28G MISC 1 each by Does not apply route in the morning and at bedtime. 100 each 1   No current facility-administered medications for this visit.    Psychiatric Specialty Exam:  Review of Systems  There were no vitals taken for this visit.There is no height or weight on file to calculate BMI.  General Appearance: Fairly Groomed  Eye Contact:  Good  Speech:  Clear and Coherent and Normal Rate  Volume:  Normal  Mood:  Depressed and Dysphoric  Affect:  Depressed and Tearful  Thought Process:  Goal Directed and Descriptions of Associations: Intact  Orientation:  Full (Time, Place, and Person)  Thought Content:  Logical  Suicidal Thoughts:  No  Homicidal Thoughts:  No  Memory:  Immediate;   Good Recent;   Good  Judgement:  Fair  Insight:  Fair  Psychomotor Activity:  Normal  Concentration:  Concentration: Good and Attention Span: Good  Recall:  Good  Fund of Knowledge:Good  Language: Good  Akathisia:  Negative  Handed:  Right  AIMS (if indicated):  0  Assets:  Communication Skills Desire for Improvement Financial Resources/Insurance Housing  ADL's:  Intact  Cognition: WNL  Sleep:  Poor   Screenings: GAD-7     Office Visit from 02/06/2020 in [place] from 11/16/2019 in [place] Office Visit from 11/03/2019 in [place]  Total GAD-7 Score $RemoveBef'17 16 18    'nzmHYEEPaW$ PHQ2-9     Office Visit  from 02/06/2020 in [place] from 11/16/2019 in [place] Office Visit from 11/03/2019 in [place]  PHQ-2 Total Score $RemoveBef'6 6 6  'lNdDTZWfyt$ PHQ-9 Total Score $RemoveBef'24 22 21      'fBCHWVQXEN$ Assessment and Plan: Patient seen for evaluation, patient is reporting ongoing depression and anxiety symptoms.  She does not feel her current medicines are helping.  She is agreeable to adjusting the dose of sertraline for optimal effect.  Will discontinue buspirone and try clonazepam to help optimally with her anxiety symptoms. Potential side effects of medication and risks vs benefits of treatment vs non-treatment were explained and discussed. All questions were answered.   1. Major depressive disorder, recurrent episode with anxious distress (HCC)  - Increase sertraline (ZOLOFT) 100 MG tablet; Take one and a half tablets daily (150 mg)  Dispense: 45 tablet; Refill: 1 - Start clonazePAM (KLONOPIN) 0.5 MG tablet; Take 1 tablet (0.5 mg total) by mouth 2 (two) times daily.  Dispense: 60 tablet; Refill: 1 - Discontinue Buspirone. - Continue Trazodone 150 mg HS.  Continue therapy. F/up in 1 month.    [place] Crane, MD 11/10/202111:46 AM

## 2020-04-11 ENCOUNTER — Other Ambulatory Visit: Payer: Self-pay

## 2020-04-11 ENCOUNTER — Ambulatory Visit (INDEPENDENT_AMBULATORY_CARE_PROVIDER_SITE_OTHER): Payer: No Payment, Other | Admitting: Licensed Clinical Social Worker

## 2020-04-11 DIAGNOSIS — F418 Other specified anxiety disorders: Secondary | ICD-10-CM | POA: Diagnosis not present

## 2020-04-12 ENCOUNTER — Encounter (HOSPITAL_COMMUNITY): Payer: Self-pay | Admitting: Psychiatry

## 2020-04-12 ENCOUNTER — Telehealth (INDEPENDENT_AMBULATORY_CARE_PROVIDER_SITE_OTHER): Payer: No Payment, Other | Admitting: Psychiatry

## 2020-04-12 ENCOUNTER — Other Ambulatory Visit: Payer: Self-pay | Admitting: Psychiatry

## 2020-04-12 ENCOUNTER — Other Ambulatory Visit: Payer: Self-pay

## 2020-04-12 DIAGNOSIS — F339 Major depressive disorder, recurrent, unspecified: Secondary | ICD-10-CM

## 2020-04-12 MED ORDER — SERTRALINE HCL 100 MG PO TABS: ORAL_TABLET | ORAL | 1 refills | Status: DC

## 2020-04-12 MED ORDER — CLONAZEPAM 1 MG PO TABS: 1.0000 mg | ORAL_TABLET | Freq: Two times a day (BID) | ORAL | 1 refills | Status: DC | PRN

## 2020-04-12 MED ORDER — TRAZODONE HCL 100 MG PO TABS
ORAL_TABLET | ORAL | 1 refills | Status: DC
Start: 2020-04-12 — End: 2020-06-12

## 2020-04-12 MED FILL — SERTRALINE HCL 100 MG TAB: 100 | 30 days supply | Qty: 45 | Fill #0

## 2020-04-12 MED FILL — TRAZODONE HCL 100 MG TABS: 100 | 30 days supply | Qty: 60 | Fill #0

## 2020-04-12 MED FILL — METFORMIN HCL 500 MG TABS: 500 | 30 days supply | Qty: 120 | Fill #2

## 2020-04-12 MED FILL — ?ATORVASTATIN 20 MG TABLET: 20 | 30 days supply | Qty: 30 | Fill #2

## 2020-04-12 NOTE — Progress Notes (Signed)
Karen Whitehead OP Progress Note  Virtual Visit via Telephone Note  I connected with Karen Whitehead on 04/12/20 at  4:20 PM EST by telephone and verified that I am speaking with the correct person using two identifiers.  Location: Patient: home Provider: Clinic   I discussed the limitations, risks, security and privacy concerns of performing an evaluation and management service by telephone and the availability of in person appointments. I also discussed with the patient that there may be a patient responsible charge related to this service. The patient expressed understanding and agreed to proceed.   I provided 16 minutes of non-face-to-face time during this encounter.     Patient Identification: Karen Whitehead MRN:  [id number] Date of Evaluation:  04/12/2020    Chief Complaint:   " I feel this medicine is helping me but I still feel bad on some days."  Visit Diagnosis:    ICD-10-CM   1. Major depressive disorder, recurrent episode with anxious distress (HCC)  F33.9     History of Present Illness: Patient reported that she felt that the medicine clonazepam has helped her anxiety to some extent.  However she stated that when she took 1 tablet it did not do much however when she started taking 2 together she found it to be helpful.  She informed that for the past 2 weeks she was taking 2 tablets in the morning and 2 tablets in the evening and then maybe one extra tablet in the middle of the day.  She reported that she had 2 or 3 panic attacks last week however she has not had any this week. She informed that taking the trazodone with the clonazepam at bedtime has been helpful however she still wakes up at night on most of the nights and then has to take an half dose of trazodone to fall back to sleep. She stated that she is feeling a little better and is very grateful for the writer to prescribe her with these medicines as they are helping.  Writer informed her that we can go ahead and increase the  dose of clonazepam to 1 mg twice a day however she should avoid using an extra dose in the middle of the day as the half-life of the medicine is long and can cover her for the whole day.  Writer also suggested that we go up on the dose of trazodone to 200 mg so that she does not have to wake up in the middle of the night and get an extra half dose.  She informed that her son had contracted Covid and is doing better.  She stated that some of the stressors she was dealing with are slightly getting better and that is helping to.  She has been talking to therapist Ms. Geni Bers regularly.  She expressed appreciation of the writer's help.    Past Psychiatric History: MDD, anxiety  Previous Psychotropic Medications: Yes   Substance Abuse History in the last 12 months:  No.  Consequences of Substance Abuse: NA  Past Medical History:  Past Medical History:  Diagnosis Date  . Granuloma annulare     Past Surgical History:  Procedure Laterality Date  . APPENDECTOMY      Family Psychiatric History: denied  Family History: No family history on file.  Social History:   Social History   Socioeconomic History  . Marital status: Single    Spouse name: Not on file  . Number of children: Not on file  . Years of  education: Not on file  . Highest education level: Not on file  Occupational History  . Not on file  Tobacco Use  . Smoking status: Current Every Day Smoker    Packs/day: 0.50    Types: Cigarettes  . Smokeless tobacco: Never Used  Substance and Sexual Activity  . Alcohol use: No  . Drug use: No  . Sexual activity: Not on file  Other Topics Concern  . Not on file  Social History Narrative  . Not on file   Social Determinants of Health   Financial Resource Strain: Not on file  Food Insecurity: Not on file  Transportation Needs: Not on file  Physical Activity: Not on file  Stress: Not on file  Social Connections: Not on file    Additional Social History: Lives with  her 23 y/o son  Allergies:  No Known Allergies  Metabolic Disorder Labs: Lab Results  Component Value Date   HGBA1C 6.1 02/06/2020   No results found for: PROLACTIN No results found for: CHOL, TRIG, HDL, CHOLHDL, VLDL, LDLCALC No results found for: TSH  Therapeutic Level Labs: No results found for: LITHIUM No results found for: CBMZ No results found for: VALPROATE  Current Medications: Current Outpatient Medications  Medication Sig Dispense Refill  . albuterol (VENTOLIN HFA) 108 (90 Base) MCG/ACT inhaler Inhale 1-2 puffs into the lungs every 6 (six) hours as needed for wheezing or shortness of breath. 18 g 1  . atorvastatin (LIPITOR) 20 MG tablet Take 1 tablet (20 mg total) by mouth daily. 30 tablet 3  . Blood Glucose Monitoring Suppl (TRUE METRIX METER) w/Device KIT 1 each by Does not apply route in the morning and at bedtime. 1 kit 0  . busPIRone (BUSPAR) 7.5 MG tablet Take 1 tablet (7.5 mg total) by mouth 2 (two) times daily. 60 tablet 3  . clonazePAM (KLONOPIN) 0.5 MG tablet Take 1 tablet (0.5 mg total) by mouth 2 (two) times daily. 60 tablet 1  . cyclobenzaprine (FLEXERIL) 10 MG tablet Take 1 tablet (10 mg total) by mouth at bedtime. Prn muscle pain 30 tablet 1  . fluticasone (CUTIVATE) 0.005 % ointment Apply 1 application topically 2 (two) times daily.    Marland Kitchen glucose blood (TRUE METRIX BLOOD GLUCOSE TEST) test strip Use as instructed 100 each 12  . meloxicam (MOBIC) 7.5 MG tablet Take 1 tablet (7.5 mg total) by mouth daily. 30 tablet 0  . metFORMIN (GLUCOPHAGE) 500 MG tablet Take 2 tablets (1,000 mg total) by mouth 2 (two) times daily with a meal. 120 tablet 3  . sertraline (ZOLOFT) 100 MG tablet Take one and a half tablets daily (150 mg) 45 tablet 1  . traZODone (DESYREL) 150 MG tablet Take 1 tablet (150 mg total) by mouth at bedtime. 30 tablet 6  . TRUEplus Lancets 28G MISC 1 each by Does not apply route in the morning and at bedtime. 100 each 1   No current  facility-administered medications for this visit.    Psychiatric Specialty Exam: Review of Systems  There were no vitals taken for this visit.There is no height or weight on file to calculate BMI.  General Appearance: unable to assess due to phone visit  Eye Contact:  Good  Speech:  Clear and Coherent and Normal Rate  Volume:  Normal  Mood: Less Depressed  Affect:  Congruent  Thought Process:  Goal Directed and Descriptions of Associations: Intact  Orientation:  Full (Time, Place, and Person)  Thought Content:  Logical  Suicidal Thoughts:  No  Homicidal Thoughts:  No  Memory:  Immediate;   Good Recent;   Good  Judgement:  Fair  Insight:  Fair  Psychomotor Activity:  Normal  Concentration:  Concentration: Good and Attention Span: Good  Recall:  Good  Fund of Knowledge:Good  Language: Good  Akathisia:  Negative  Handed:  Right  AIMS (if indicated):  0  Assets:  Communication Skills Desire for Improvement Financial Resources/Insurance Housing  ADL's:  Intact  Cognition: WNL  Sleep:  Fair   Screenings: GAD-7   Flowsheet Row Office Visit from 02/06/2020 in [place] from 11/16/2019 in [place] Office Visit from 11/03/2019 in [place]  Total GAD-7 Score _0 PHQ2-9   [place] Office Visit from 02/06/2020 in [place] from 11/16/2019 in [place] Office Visit from 11/03/2019 in [place]  PHQ-2 Total Score _1 PHQ-9 Total Score _2 Assessment and Plan: Patient reported some improvement in her depression and anxiety and panic attack symptoms since she was started on clonazepam and the dose of sertraline was increased.  She requested a higher dose of clonazepam as she has been taking 2 tablets of the clonazepam  0.5 mg strength.  She stated that 1 mg dose of clonazepam twice a day has been effective and she requested to be continued on that.   1. Major depressive disorder, recurrent episode with anxious distress (HCC)  - Increase clonazePAM (KLONOPIN) 1 MG tablet; Take 1 tablet (1 mg total) by mouth 2 (two) times daily as needed for anxiety.  Dispense: 60 tablet; Refill: 1 - Continue sertraline (ZOLOFT) 100 MG tablet; Take one and a half tablets daily (150 mg)  Dispense: 45 tablet; Refill: 1 - Increase Trazodone 200 mg HS  Continue therapy with Ms. Hatch. F/up in 2 month.    [place] Crane, MD 12/9/20211:11 PM
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
## 2020-05-09 ENCOUNTER — Other Ambulatory Visit: Payer: Self-pay | Admitting: Family Medicine

## 2020-05-09 ENCOUNTER — Other Ambulatory Visit: Payer: Self-pay

## 2020-05-09 ENCOUNTER — Ambulatory Visit: Payer: Self-pay | Attending: Family Medicine | Admitting: Family Medicine

## 2020-05-09 DIAGNOSIS — E1165 Type 2 diabetes mellitus with hyperglycemia: Secondary | ICD-10-CM

## 2020-05-09 DIAGNOSIS — G8929 Other chronic pain: Secondary | ICD-10-CM

## 2020-05-09 DIAGNOSIS — M25561 Pain in right knee: Secondary | ICD-10-CM

## 2020-05-09 DIAGNOSIS — F411 Generalized anxiety disorder: Secondary | ICD-10-CM

## 2020-05-09 DIAGNOSIS — M25562 Pain in left knee: Secondary | ICD-10-CM

## 2020-05-09 DIAGNOSIS — M791 Myalgia, unspecified site: Secondary | ICD-10-CM

## 2020-05-09 MED ORDER — MELOXICAM 7.5 MG PO TABS: 7.5000 mg | ORAL_TABLET | Freq: Every day | ORAL | 3 refills | Status: DC

## 2020-05-09 MED ORDER — CYCLOBENZAPRINE HCL 10 MG PO TABS: 10.0000 mg | ORAL_TABLET | Freq: Every day | ORAL | 3 refills | Status: DC

## 2020-05-09 MED ORDER — GLIPIZIDE 5 MG PO TABS: 2.5000 mg | ORAL_TABLET | Freq: Two times a day (BID) | ORAL | 3 refills | Status: DC

## 2020-05-09 MED ORDER — ATORVASTATIN CALCIUM 20 MG PO TABS: 20.0000 mg | ORAL_TABLET | Freq: Every day | ORAL | 3 refills | Status: DC

## 2020-05-09 MED ORDER — DAPAGLIFLOZIN PROPANEDIOL 5 MG PO TABS: 5.0000 mg | ORAL_TABLET | Freq: Every day | ORAL | 3 refills | Status: DC

## 2020-05-09 MED ORDER — METFORMIN HCL 500 MG PO TABS
1000.0000 mg | ORAL_TABLET | Freq: Two times a day (BID) | ORAL | 3 refills | Status: DC
Start: 2020-05-09 — End: 2020-05-09

## 2020-05-09 MED ORDER — TRUE METRIX BLOOD GLUCOSE TEST VI STRP: ORAL_STRIP | 12 refills | Status: DC

## 2020-05-09 MED FILL — FARXIGA 5 MG TABLET: 5 | 30 days supply | Qty: 30 | Fill #0

## 2020-05-09 MED FILL — ?GLIPIZIDE 5MG TABLET: 5 | 30 days supply | Qty: 30 | Fill #0

## 2020-05-09 MED FILL — ?ATORVASTATIN 20 MG TABLET: 20 | 30 days supply | Qty: 30 | Fill #0

## 2020-05-09 MED FILL — TRUE METRIX GLUCOSE TEST ST: 25 days supply | Qty: 100 | Fill #0

## 2020-05-09 MED FILL — CYCLOBENZAPRINE 10 MG TAB: 10 | 30 days supply | Qty: 30 | Fill #0

## 2020-05-09 MED FILL — MELOXICAM 7.5 MG TABLET: 7.5 | 30 days supply | Qty: 30 | Fill #0

## 2020-05-09 NOTE — Progress Notes (Signed)
States that she has est with [facility]. States that her muscles get tight at times. Anxiety is getting worse.

## 2020-05-09 NOTE — Progress Notes (Signed)
Virtual Visit via Telephone Note  I connected with Karen Whitehead, on 05/09/2020 at 9:34 AM by telephone due to the COVID-19 pandemic and verified that I am speaking with the correct person using two identifiers.   Consent: I discussed the limitations, risks, security and privacy concerns of performing an evaluation and management service by telephone and the availability of in person appointments. I also discussed with the patient that there may be a patient responsible charge related to this service. The patient expressed understanding and agreed to proceed.   Location of Patient: Home  Location of Provider: Clinic   Persons participating in Telemedicine visit: Karen Whitehead Dr. Margarita Rana     History of Present Illness: [name] 86 year olf female with Type2 DM (A1c 6.1), anxiety and depression, insomnia who presents today for follow-up visit.  Her Anxiety has worsened due to members of her family having COVID and she is so afraid of contracting it. She has received just her first dose of her COVID vaccine. Last seen by [facility] last month and her therapy appointments are once a month.  Complains of the muscles in her legs locking up. She has not been out of the house in a month; going out causes her panic attacks. Also having knee pains uncontrolled on current regimen.Taking Cyclobenzaprine has not been beneficial  Fasting sugar was 182 yesterday and she feels her sugars have been elevated of recent. Metformin causes Diarrhea and in the past Glipizide caused hypoglycemia.  Past Medical History:  Diagnosis Date  . Granuloma annulare    No Known Allergies  Current Outpatient Medications on File Prior to Visit  Medication Sig Dispense Refill  . albuterol (VENTOLIN HFA) 108 (90 Base) MCG/ACT inhaler Inhale 1-2 puffs into the lungs every 6 (six) hours as needed for wheezing or shortness of breath. 18 g 1  . atorvastatin (LIPITOR) 20 MG tablet  Take 1 tablet (20 mg total) by mouth daily. 30 tablet 3  . Blood Glucose Monitoring Suppl (TRUE METRIX METER) w/Device KIT 1 each by Does not apply route in the morning and at bedtime. 1 kit 0  . clonazePAM (KLONOPIN) 1 MG tablet Take 1 tablet (1 mg total) by mouth 2 (two) times daily as needed for anxiety. 60 tablet 1  . cyclobenzaprine (FLEXERIL) 10 MG tablet Take 1 tablet (10 mg total) by mouth at bedtime. Prn muscle pain 30 tablet 1  . fluticasone (CUTIVATE) 0.005 % ointment Apply 1 application topically 2 (two) times daily.    Marland Kitchen glucose blood (TRUE METRIX BLOOD GLUCOSE TEST) test strip Use as instructed 100 each 12  . meloxicam (MOBIC) 7.5 MG tablet Take 1 tablet (7.5 mg total) by mouth daily. 30 tablet 0  . metFORMIN (GLUCOPHAGE) 500 MG tablet Take 2 tablets (1,000 mg total) by mouth 2 (two) times daily with a meal. 120 tablet 3  . sertraline (ZOLOFT) 100 MG tablet Take one and a half tablets daily (150 mg) 45 tablet 1  . traZODone (DESYREL) 100 MG tablet Take 2 tablets at bedtime 60 tablet 1  . TRUEplus Lancets 28G MISC 1 each by Does not apply route in the morning and at bedtime. 100 each 1  . [DISCONTINUED] potassium chloride (K-DUR) 10 MEQ tablet Take 1 tablet (10 mEq total) by mouth daily. (Patient not taking: Reported on 02/19/2016) 10 tablet 0   No current facility-administered medications on file prior to visit.    Observations/Objective: Awake, alert, oriented x3 Not in acute  distress Anxious mood  Lab Results  Component Value Date   HGBA1C 6.1 02/06/2020    Assessment and Plan: 1. Chronic pain of both knees Uncontrolled - meloxicam (MOBIC) 7.5 MG tablet; Take 1 tablet (7.5 mg total) by mouth daily.  Dispense: 30 tablet; Refill: 3 - Ambulatory referral to Physical Therapy  2. Muscle pain Uncontrolled - cyclobenzaprine (FLEXERIL) 10 MG tablet; Take 1 tablet (10 mg total) by mouth at bedtime. Prn muscle pain  Dispense: 30 tablet; Refill: 3 - Ambulatory referral to  Physical Therapy  3. Type 2 diabetes mellitus with hyperglycemia, without long-term current use of insulin (HCC) Controlled with a1c of 6.1; she is due for repeat A1c Unable to tolerate Metformin due to GI adverse effect Placed on low dose Glipizide, Farxiga initiated - atorvastatin (LIPITOR) 20 MG tablet; Take 1 tablet (20 mg total) by mouth daily.  Dispense: 30 tablet; Refill: 3 - glucose blood (TRUE METRIX BLOOD GLUCOSE TEST) test strip; Use as instructed  Dispense: 100 each; Refill: 12 - glipiZIDE (GLUCOTROL) 5 MG tablet; Take 0.5 tablets (2.5 mg total) by mouth 2 (two) times daily before a meal.  Dispense: 30 tablet; Refill: 3 - dapagliflozin propanediol (FARXIGA) 5 MG TABS tablet; Take 1 tablet (5 mg total) by mouth daily before breakfast.  Dispense: 30 tablet; Refill: 3  4. GAD (generalized anxiety disorder) Uncontrolled Exacerbated by ongoing pandemic Advised to reach out to therapist for frequent therapy sessions Encouraged to receive the 2nd dose of her COVID vaccine   Follow Up Instructions: 3 months   I discussed the assessment and treatment plan with the patient. The patient was provided an opportunity to ask questions and all were answered. The patient agreed with the plan and demonstrated an understanding of the instructions.   The patient was advised to call back or seek an in-person evaluation if the symptoms worsen or if the condition fails to improve as anticipated.     I provided 21 minutes total of non-face-to-face time during this encounter including median intraservice time, reviewing previous notes, investigations, ordering medications, medical decision making, coordinating care and patient verbalized understanding at the end of the visit.     Charlott Rakes, MD, FAAFP. [facility] and [facility] [place], [place]   05/09/2020, 9:34 AM

## 2020-05-18 MED FILL — ?ATORVASTATIN 20 MG TABLET: 20 | 30 days supply | Qty: 30 | Fill #0

## 2020-05-18 MED FILL — FARXIGA 5 MG TABLET: 5 | 30 days supply | Qty: 30 | Fill #0

## 2020-05-18 MED FILL — ?GLIPIZIDE 5MG TABLET: 5 | 30 days supply | Qty: 30 | Fill #0

## 2020-05-18 MED FILL — ?SERTRALINE HCL 100 MG TABS: 100 | 30 days supply | Qty: 45 | Fill #1

## 2020-05-31 ENCOUNTER — Ambulatory Visit: Payer: Self-pay

## 2020-06-04 ENCOUNTER — Telehealth: Payer: Self-pay | Admitting: Family Medicine

## 2020-06-04 NOTE — Telephone Encounter (Signed)
I return Pt call, schedule a financial appt for 06/11/20

## 2020-06-04 NOTE — Telephone Encounter (Signed)
Paient is calling to get in touch with Clifton James to renew her [employer]. Please advise Cb- [phone]

## 2020-06-07 ENCOUNTER — Ambulatory Visit (INDEPENDENT_AMBULATORY_CARE_PROVIDER_SITE_OTHER): Payer: No Payment, Other | Admitting: Licensed Clinical Social Worker

## 2020-06-07 ENCOUNTER — Other Ambulatory Visit: Payer: Self-pay

## 2020-06-07 DIAGNOSIS — F418 Other specified anxiety disorders: Secondary | ICD-10-CM | POA: Diagnosis not present

## 2020-06-11 ENCOUNTER — Telehealth (HOSPITAL_COMMUNITY): Payer: No Payment, Other | Admitting: Psychiatry

## 2020-06-11 ENCOUNTER — Other Ambulatory Visit: Payer: Self-pay

## 2020-06-11 ENCOUNTER — Ambulatory Visit: Payer: Self-pay | Attending: Family Medicine

## 2020-06-11 NOTE — Progress Notes (Signed)
   THERAPIST PROGRESS NOTE   Virtual Visit via Video Note  I connected with [place] on 06/07/20 at  9:00 AM EST by a video enabled telemedicine application and verified that I am speaking with the correct person using two identifiers.  Location: Patient: Home Provider: [facility]   I discussed the limitations of evaluation and management by telemedicine and the availability of in person appointments. The patient expressed understanding and agreed to proceed. I discussed the assessment and treatment plan with the patient. The patient was provided an opportunity to ask questions and all were answered. The patient agreed with the plan and demonstrated an understanding of the instructions.  I provided 42 minutes of non-face-to-face time during this encounter.  Participation Level: Active  Behavioral Response: CasualAlertNegative, Depressed and Dysphoric  Type of Therapy: Individual Therapy  Treatment Goals addressed: Communication: Dep/Anx/Coping  Interventions: CBT and Supportive  Summary: Karen Whitehead is a 55 y.o. female who presents with hx of dep/anx. This date pt signs on for video session and there are no tech issues throughout session. Pt continues to struggle with depressive symptoms and anxiety. Pt reports she is "not well", she is again tearful through much of session. Pt is heavily isolating. She states she was going to try to force herself to go out for a walk for exercise recently. Reports she got dressed and got herself to the door but when she opened the door she experienced panic and could not leave. Pt continues to have lack of motivation, poor focus. Pt reports she is taking meds as prescribed and had recent increase in Zoloft but mood not well managed. Pt has next med appt 2/7. Encouraged her to be very open r/t symptoms and provide examples. Pt agrees. Pt reports she is going to have her Social Security psych eval 2/23 and how nervous she is. Also states she is supposed  to start PT for muscle spasms but is fearful to go. LCSW assisted to process thoughts/feelings. LCSW assessed for status of pt's son with COVID and if she got COVID. Pt reports son is better and she did not get ill. Pt advises her son just lost his job and his income is the only income for the household. She states he is actively looking for work and seeking unemployment. Pt reports she tried to communicate with one of her cousins recently but it went poorly. She vents about how badly her fam treats her and how she has "no one". LCSW reframed this to a more accurate statement and encouraged her to surround herself/time with those who are supportive and caring of her, son and sister. Readdressed self talk. Pt states she is "trying" but it is hard. LCSW provided additional education on process and benefits. LCSW assessed for letter writing to father. Pt states she has not done this. Encouraged her to make an effort to relieve some of her inner anger. Pt agrees. LCSW review poc and coping prior to close of session. Pt states appreciation for care.  Suicidal/Homicidal: Nowithout intent/plan  Therapist Response: Pt remains receptive to care.  Plan: Return again in 2 weeks.  Diagnosis: Axis I: Depression with anxiety    Axis II: Deferred  Hermine Messick, LCSW 06/11/2020

## 2020-06-12 ENCOUNTER — Other Ambulatory Visit: Payer: Self-pay | Admitting: Psychiatry

## 2020-06-12 ENCOUNTER — Telehealth (INDEPENDENT_AMBULATORY_CARE_PROVIDER_SITE_OTHER): Payer: No Payment, Other | Admitting: Psychiatry

## 2020-06-12 ENCOUNTER — Encounter (HOSPITAL_COMMUNITY): Payer: Self-pay | Admitting: Psychiatry

## 2020-06-12 DIAGNOSIS — F339 Major depressive disorder, recurrent, unspecified: Secondary | ICD-10-CM

## 2020-06-12 MED ORDER — SERTRALINE HCL 100 MG PO TABS: ORAL_TABLET | ORAL | 1 refills | Status: DC

## 2020-06-12 MED ORDER — CLONAZEPAM 1 MG PO TABS
1.0000 mg | ORAL_TABLET | Freq: Two times a day (BID) | ORAL | 1 refills | Status: DC | PRN
Start: 2020-06-12 — End: 2020-07-25

## 2020-06-12 MED ORDER — TRAZODONE HCL 100 MG PO TABS
ORAL_TABLET | ORAL | 1 refills | Status: DC
Start: 2020-06-12 — End: 2020-07-25

## 2020-06-12 MED FILL — TRAZODONE HCL 100 MG TABS: 100 | 30 days supply | Qty: 60 | Fill #0

## 2020-06-12 MED FILL — ?SERTRALINE HCL 100 MG TABS: 100 | 30 days supply | Qty: 60 | Fill #0

## 2020-06-12 NOTE — Progress Notes (Signed)
[place] [facility] Progress Note  Virtual Visit via Video Note  I connected with Karen Whitehead on 06/12/20 at  9:20 AM EST by a video enabled telemedicine application and verified that I am speaking with the correct person using two identifiers.  Location: Patient: Home Provider: Clinic   I discussed the limitations of evaluation and management by telemedicine and the availability of in person appointments. The patient expressed understanding and agreed to proceed.  I provided 16 minutes of non-face-to-face time during this encounter.     Patient Identification: Karen Whitehead MRN:  [id number] Date of Evaluation:  06/12/2020    Chief Complaint:   " I feel this medicine is helping me but I still feel bad on some days."  Visit Diagnosis:    ICD-10-CM   1. Major depressive disorder, recurrent episode with anxious distress (HCC)  F33.9     History of Present Illness: Patient reported that she still feeling very anxious lately.  She stated on Sunday she has had to take extra dose of clonazepam in the middle of the day.  She stated that she is very anxious because her son lost his job about a month ago and that was the only source of income for the family.  She stated that has caused her to be in a nervous state. She also mentioned that her stepmother did something that made her very upset.  Lately she does not want to be around anyone and she does not trust anyone. She also reported that she feels that she cannot drive without worrying about possible negative outcomes that can happen.  She is very anxious when it comes to sitting behind the wheel.  Writer recommended trying a higher dose of sertraline that can address her anxiety and depressive symptoms optimally.  Patient stated that she agrees to try it. She has been seeing her therapist regularly and finds it to be very helpful.   Past Psychiatric History: MDD, anxiety  Previous Psychotropic Medications: Yes   Substance Abuse History in the  last 12 months:  No.  Consequences of Substance Abuse: NA  Past Medical History:  Past Medical History:  Diagnosis Date  . Granuloma annulare     Past Surgical History:  Procedure Laterality Date  . APPENDECTOMY      Family Psychiatric History: denied  Family History: No family history on file.  Social History:   Social History   Socioeconomic History  . Marital status: Single    Spouse name: Not on file  . Number of children: Not on file  . Years of education: Not on file  . Highest education level: Not on file  Occupational History  . Not on file  Tobacco Use  . Smoking status: Current Every Day Smoker    Packs/day: 0.50    Types: Cigarettes  . Smokeless tobacco: Never Used  Substance and Sexual Activity  . Alcohol use: No  . Drug use: No  . Sexual activity: Not on file  Other Topics Concern  . Not on file  Social History Narrative  . Not on file   Social Determinants of Health   Financial Resource Strain: Not on file  Food Insecurity: Not on file  Transportation Needs: Not on file  Physical Activity: Not on file  Stress: Not on file  Social Connections: Not on file    Additional Social History: Lives with her 3 y/o son  Allergies:  No Known Allergies  Metabolic Disorder Labs: Lab Results  Component Value Date  HGBA1C 6.1 02/06/2020   No results found for: PROLACTIN No results found for: CHOL, TRIG, HDL, CHOLHDL, VLDL, LDLCALC No results found for: TSH  Therapeutic Level Labs: No results found for: LITHIUM No results found for: CBMZ No results found for: VALPROATE  Current Medications: Current Outpatient Medications  Medication Sig Dispense Refill  . albuterol (VENTOLIN HFA) 108 (90 Base) MCG/ACT inhaler Inhale 1-2 puffs into the lungs every 6 (six) hours as needed for wheezing or shortness of breath. 18 g 1  . atorvastatin (LIPITOR) 20 MG tablet Take 1 tablet (20 mg total) by mouth daily. 30 tablet 3  . Blood Glucose Monitoring Suppl  (TRUE METRIX METER) w/Device KIT 1 each by Does not apply route in the morning and at bedtime. 1 kit 0  . clonazePAM (KLONOPIN) 1 MG tablet Take 1 tablet (1 mg total) by mouth 2 (two) times daily as needed for anxiety. 60 tablet 1  . cyclobenzaprine (FLEXERIL) 10 MG tablet Take 1 tablet (10 mg total) by mouth at bedtime. Prn muscle pain 30 tablet 3  . dapagliflozin propanediol (FARXIGA) 5 MG TABS tablet Take 1 tablet (5 mg total) by mouth daily before breakfast. 30 tablet 3  . fluticasone (CUTIVATE) 0.005 % ointment Apply 1 application topically 2 (two) times daily.    Marland Kitchen glipiZIDE (GLUCOTROL) 5 MG tablet Take 0.5 tablets (2.5 mg total) by mouth 2 (two) times daily before a meal. 30 tablet 3  . glucose blood (TRUE METRIX BLOOD GLUCOSE TEST) test strip Use as instructed 100 each 12  . meloxicam (MOBIC) 7.5 MG tablet Take 1 tablet (7.5 mg total) by mouth daily. 30 tablet 3  . sertraline (ZOLOFT) 100 MG tablet Take one and a half tablets daily (150 mg) 45 tablet 1  . traZODone (DESYREL) 100 MG tablet Take 2 tablets at bedtime 60 tablet 1  . TRUEplus Lancets 28G MISC 1 each by Does not apply route in the morning and at bedtime. 100 each 1   No current facility-administered medications for this visit.    Psychiatric Specialty Exam: Review of Systems  There were no vitals taken for this visit.There is no height or weight on file to calculate BMI.  General Appearance: Fairly groomed  Eye Contact:  Good  Speech:  Clear and Coherent and Normal Rate  Volume:  Normal  Mood:  Anxious  Affect:  Congruent  Thought Process:  Goal Directed and Descriptions of Associations: Intact  Orientation:  Full (Time, Place, and Person)  Thought Content:  Logical  Suicidal Thoughts:  No  Homicidal Thoughts:  No  Memory:  Immediate;   Good Recent;   Good  Judgement:  Fair  Insight:  Fair  Psychomotor Activity:  Normal  Concentration:  Concentration: Good and Attention Span: Good  Recall:  Good  Fund of  Knowledge:Good  Language: Good  Akathisia:  Negative  Handed:  Right  AIMS (if indicated):  0  Assets:  Communication Skills Desire for Improvement Financial Resources/Insurance Housing  ADL's:  Intact  Cognition: WNL  Sleep:  Fair   Screenings: GAD-7   Personnel officer Visit from 02/06/2020 in [place] from 11/16/2019 in [place] Office Visit from 11/03/2019 in [place]  Total GAD-7 Score $RemoveBef'17 16 18    'RPNwTLfLhR$ PHQ2-9   [place] Office Visit from 02/06/2020 in [place] from 11/16/2019 in [facility] Office Visit from 11/03/2019 in [place]  PHQ-2 Total Score $RemoveBef'6 6 6  'FOASjvXanH$ PHQ-9 Total Score $RemoveBef'24 22 21    'asGsGHqtBY$ [place] from 11/24/2019 in [place] from 11/16/2019 in [place] Low Risk Low Risk      Assessment and Plan: Patient continues to deal with anxiety depression symptoms due to several ongoing stressors.  She is agreeable to adjusting the dose of sertraline to 200 mg daily for optimal effect.  We will continue the clonazepam and trazodone at the same doses.   1. Major depressive disorder, recurrent episode with anxious distress (HCC)  - Increase sertraline (ZOLOFT) 100 MG tablet; Take 2 tablets daily (200 mg)  Dispense: 60 tablet; Refill: 1 - traZODone (DESYREL) 100 MG tablet; Take 2 tablets at bedtime  Dispense: 60 tablet; Refill: 1 - clonazePAM (KLONOPIN) 1 MG tablet; Take 1 tablet (1 mg total) by mouth 2 (two) times daily as needed for anxiety.  Dispense: 60 tablet; Refill: 1   Continue therapy with Ms. Hatch. F/up in 6 weeks.    [place] Crane, MD 2/8/20229:24 AM

## 2020-06-15 ENCOUNTER — Ambulatory Visit: Payer: Self-pay | Admitting: Physical Therapy

## 2020-06-15 ENCOUNTER — Encounter: Payer: Self-pay | Admitting: Physical Therapy

## 2020-06-21 ENCOUNTER — Other Ambulatory Visit: Payer: Self-pay

## 2020-06-21 ENCOUNTER — Ambulatory Visit (INDEPENDENT_AMBULATORY_CARE_PROVIDER_SITE_OTHER): Payer: No Payment, Other | Admitting: Licensed Clinical Social Worker

## 2020-06-21 DIAGNOSIS — F418 Other specified anxiety disorders: Secondary | ICD-10-CM

## 2020-06-21 NOTE — Progress Notes (Signed)
   THERAPIST PROGRESS NOTE   Virtual Visit via Video Note  I connected with Karen Whitehead on 06/21/20 at  9:00 AM EST by a video enabled telemedicine application and verified that I am speaking with the correct person using two identifiers.  Location: Patient: Home Provider: [facility]   I discussed the limitations of evaluation and management by telemedicine and the availability of in person appointments. The patient expressed understanding and agreed to proceed. I discussed the assessment and treatment plan with the patient. The patient was provided an opportunity to ask questions and all were answered. The patient agreed with the plan and demonstrated an understanding of the instructions.  I provided 30 minutes of non-face-to-face time during this encounter.  Participation Level: Active  Behavioral Response: Casual and NeatAlertmoderately depressed  Type of Therapy: Individual Therapy  Treatment Goals addressed: Communication: Depression/Anxiety/Coping  Interventions: Supportive and Reframing  Summary: Karen Whitehead is a 55 y.o. female who presents with hx of dep/anx. This date pt signs on for video session. She is noted to be neat in appearance with earrings on and a slight smile. Pt greets LCSW and states "I'm doing pretty good". LCSW comments on smile and noticing she looks as if she feels better. Pt confirms this. Pt states she has thought a lot about last couple of sessions. She states she has been trying very hard to work on her self talk and think more positively. She is in a better place about letting go of family relationships she feels are negative despite societal  pressures of being close to fam. Pt states there are improvements in her dep and anx symptoms. Pt states she is crying less, states if she starts to feel panic "I can think my way out of it". Pt able to leave the home with her son. Pt reports her sister will be back in town Feb 24 and she knows this will also help her  feel better. Pt had recent med appt with Zoloft increased again. She is taking meds as prescribed. Commended her for this as she has stated her fam "shamed" her about MH meds. Pt states her son remains without work, which is a stressor. She states finances are very strained and a friend brought them some food recently. LCSW provided education/referral on food pantries. Pt advises her application for disability has been denied and they cancelled the appt she had for additional eval. Pt states her sister is going to help her f/u when she gets to town. LCSW assessed for PT. Pt states she has not gone and could not call them as her phone was cut off. Sister reportedly paid the phone bill and pt intends to f/u re PT. LCSW encouraged pt to try again to go out walking since she is feeling better. Pt states she will ask her son to go out walking with her today. Encouraged pt to continue with positive thoughts and self talk and commended her for the work/efforts she has been doing. LCSW reviewed poc including scheduling prior to close of session. Pt states appreciation for care.    Suicidal/Homicidal: Nowithout intent/plan  Therapist Response: Pt remains receptive to care.  Plan: Return again for next avail appt.  Diagnosis: Axis I: Depression with anxiety    Axis II: Deferred  Hermine Messick, LCSW 06/21/2020

## 2020-06-22 MED FILL — CYCLOBENZAPRINE 10 MG TAB: 10 | 30 days supply | Qty: 30 | Fill #1

## 2020-06-22 MED FILL — ?SERTRALINE HCL 100 MG TABS: 100 | 30 days supply | Qty: 60 | Fill #0

## 2020-06-22 MED FILL — ?ATORVASTATIN 20 MG TABLET: 20 | 30 days supply | Qty: 30 | Fill #1

## 2020-06-22 MED FILL — FARXIGA 5 MG TABLET: 5 | 30 days supply | Qty: 30 | Fill #1

## 2020-06-22 MED FILL — ?GLIPIZIDE 5MG TABLET: 5 | 30 days supply | Qty: 30 | Fill #1

## 2020-06-22 MED FILL — TRAZODONE HCL 100 MG TABS: 100 | 30 days supply | Qty: 60 | Fill #1

## 2020-07-11 ENCOUNTER — Telehealth: Payer: Self-pay | Admitting: Family Medicine

## 2020-07-11 NOTE — Telephone Encounter (Signed)
   Karen Whitehead DOB: [birth year] MRN: [id number]   RIDER WAIVER AND RELEASE OF LIABILITY  For purposes of improving physical access to our facilities, [facility] is pleased to partner with third parties to provide [facility] patients or other authorized individuals the option of convenient, on-demand ground transportation services (the [place]") through use of the technology service that enables users to request on-demand ground transportation from independent third-party providers.  By opting to use and accept these [employer], I, the undersigned, hereby agree on behalf of myself, and on behalf of any minor child using the [employer] for whom I am the parent or legal guardian, as follows:  1. Government social research officer provided to me are provided by independent third-party transportation providers who are not [employer] or employees and who are unaffiliated with [employer]. 2. [facility] is neither a transportation carrier nor a common or public carrier. 3. [facility] has no control over the quality or safety of the transportation that occurs as a result of the [employer]. 4. [facility] cannot guarantee that any third-party transportation provider will complete any arranged transportation service. 5. [facility] makes no representation, warranty, or guarantee regarding the reliability, timeliness, quality, safety, suitability, or availability of any of the Transport Services or that they will be error free. 6. I fully understand that traveling by vehicle involves risks and dangers of serious bodily injury, including permanent disability, paralysis, and death. I agree, on behalf of myself and on behalf of any minor child using the Transport Services for whom I am the parent or legal guardian, that the entire risk arising out of my use of the [employer] remains solely with me, to the maximum extent permitted under applicable law. 7. The [employer] are provided "as is" and "as available." [facility] disclaims all representations and warranties, express, implied or statutory, not expressly set out in these terms, including the implied warranties of merchantability and fitness for a particular purpose. 8. I hereby waive and release [facility], its agents, employees, officers, directors, representatives, insurers, attorneys, assigns, successors, subsidiaries, and affiliates from any and all past, present, or future claims, demands, liabilities, actions, causes of action, or suits of any kind directly or indirectly arising from acceptance and use of the [employer]. 9. I further waive and release [facility] and its affiliates from all present and future liability and responsibility for any injury or death to persons or damages to property caused by or related to the use of the [employer]. 10. I have read this Waiver and Release of Liability, and I understand the terms used in it and their legal significance. This Waiver is freely and voluntarily given with the understanding that my right (as well as the right of any minor child for whom I am the parent or legal guardian using the [employer]) to legal recourse against [facility] in connection with the [employer] is knowingly surrendered in return for use of these services.   I attest that I read the consent document to Karen Whitehead, gave Karen Whitehead the opportunity to ask questions and answered the questions asked (if any). I affirm that Karen Whitehead then provided consent for she's participation in this program.     [name]

## 2020-07-24 ENCOUNTER — Ambulatory Visit: Payer: Self-pay | Attending: Family Medicine | Admitting: Physical Therapy

## 2020-07-24 ENCOUNTER — Other Ambulatory Visit: Payer: Self-pay

## 2020-07-24 ENCOUNTER — Encounter: Payer: Self-pay | Admitting: Physical Therapy

## 2020-07-24 VITALS — HR 71

## 2020-07-24 DIAGNOSIS — M5440 Lumbago with sciatica, unspecified side: Secondary | ICD-10-CM | POA: Insufficient documentation

## 2020-07-24 DIAGNOSIS — M25512 Pain in left shoulder: Secondary | ICD-10-CM | POA: Insufficient documentation

## 2020-07-24 DIAGNOSIS — M25562 Pain in left knee: Secondary | ICD-10-CM | POA: Insufficient documentation

## 2020-07-24 DIAGNOSIS — R262 Difficulty in walking, not elsewhere classified: Secondary | ICD-10-CM | POA: Insufficient documentation

## 2020-07-24 DIAGNOSIS — M25561 Pain in right knee: Secondary | ICD-10-CM | POA: Insufficient documentation

## 2020-07-24 DIAGNOSIS — R252 Cramp and spasm: Secondary | ICD-10-CM | POA: Insufficient documentation

## 2020-07-24 DIAGNOSIS — R296 Repeated falls: Secondary | ICD-10-CM | POA: Insufficient documentation

## 2020-07-24 DIAGNOSIS — G8929 Other chronic pain: Secondary | ICD-10-CM | POA: Insufficient documentation

## 2020-07-24 DIAGNOSIS — M6281 Muscle weakness (generalized): Secondary | ICD-10-CM | POA: Insufficient documentation

## 2020-07-24 NOTE — Therapy (Addendum)
[place], [place], [address] Phone: [phone]   Fax:  [phone]  Physical Therapy Evaluation  Patient Details  Name: Karen Whitehead MRN: [id number] Date of Birth: 05-12-1965 Referring Provider (PT): Charlott Rakes MD   Encounter Date: 07/24/2020   PT End of Session - 07/24/20 0756    Visit Number 1    Number of Visits 16    Date for PT Re-Evaluation 09/04/20    Authorization Type CAFA  6th visit FOTO  10th visit FOTO    PT Start Time 0800    PT Stop Time 0840    PT Time Calculation (min) 40 min    Activity Tolerance Patient tolerated treatment well    Behavior During Therapy [facility] for tasks assessed/performed           Past Medical History:  Diagnosis Date  . Granuloma annulare     Past Surgical History:  Procedure Laterality Date  . APPENDECTOMY      Vitals:   07/24/20 0800  Pulse: 71  SpO2: 96%      Subjective Assessment - 07/24/20 0805    Subjective I get nervous when I am around a lot of people.  I get very anxious.  My sister helps kids in orphanage in [place]  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 I have a chronic Skin condition.  My son works and I am staying with my sister and she goes back and forth to the [place] and she will go back next month and I am afraid to stay alone because I am weak.. I also in [place] 7 years ago and I got Legionnaires dz.  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  My shoulders hurt the most,  and my back and my legs. My knees hurt any time i walk too much.  I have pain in my back and my knees and my hips    Pertinent History Hx of dengue fever. Hx of depression, Legionnaire's dz, granuloma annulare(chronic skin dz) high anxiety, DM, Panic attacks    Limitations Walking;Lifting;House hold activities;Standing    How long can you stand comfortably? 2 minutes    Diagnostic tests none recently    Patient Stated  Goals I cant carry groceries.   I can walk for 15 minutes without stopping. I would like to be able to stand in the shower  I must sit now. i cannot stand to wash dishes    Currently in Pain? Yes    Pain Score 7     Pain Location Knee    Pain Orientation Left    Pain Descriptors / Indicators Sore    Pain Type Chronic pain    Pain Onset More than a month ago    Pain Frequency Constant    Aggravating Factors  walking for any distance    Multiple Pain Sites Yes    Pain Score 7    Pain Location Back    Pain Orientation Right;Left    Pain Descriptors / Indicators Aching    Pain Type Chronic pain              [facility] PT Assessment - 07/24/20 0001      Assessment   Medical Diagnosis bil chronic pain in knees, muscle pain    Referring Provider (PT) Charlott Rakes MD    Onset Date/Surgical Date --   3 years of knee pain/ sometimes back  Hand Dominance Right      Precautions   Precaution Comments Pt has high anxiety and does not like to be around people and afraid of falls      Restrictions   Weight Bearing Restrictions No      Balance Screen   Has the patient fallen in the past 6 months Yes    How many times? 2   because of dizziness /DM   Has the patient had a decrease in activity level because of a fear of falling?  Yes    Is the patient reluctant to leave their home because of a fear of falling?  Yes   afraid of people and [place] residence    Living Arrangements Other relatives    Available Help at Discharge Family   lives with sister now who travels to [place]     Prior Function   Level of [place] Requirements Pt has cane and should use it      Cognition   Overall Cognitive Status --   pt very anxious     Observation/Other Assessments   Focus on Therapeutic Outcomes (FOTO)  FOTO intake 21% predicted 41%      Functional Tests   Functional tests Sit to Stand      Sit to Stand    Comments 5 x STS 53.21 sec      Posture/Postural Control   Posture/Postural Control Postural limitations    Postural Limitations Rounded Shoulders;Forward head      ROM / Strength   AROM / PROM / Strength AROM;Strength      AROM   Overall AROM  Deficits    Right Shoulder Flexion 110 Degrees    Right Shoulder ABduction 103 Degrees    Left Shoulder Flexion 105 Degrees    Left Shoulder ABduction 85 Degrees    Right Knee Extension 5    Right Knee Flexion 115   pain at end range   Left Knee Extension 5    Left Knee Flexion 110   pain at end range   Lumbar Flexion 75% limitation    Lumbar Extension 50%    Lumbar - Right Side Bend 50%    Lumbar - Left Side Bend 50%      Strength   Overall Strength Deficits    Overall Strength Comments grossly 4-/5      Flexibility   Hamstrings 60 bil      Palpation   Palpation comment globally tenderness , TTP over bil Quads,  L> R      Ambulation/Gait   Assistive device None    Gait Pattern Antalgic;Decreased hip/knee flexion - right;Decreased hip/knee flexion - left;Decreased weight shift to left    Ambulation Surface Level    Gait Comments 2MWT  215 feet  Pt needs to use AD full time  has a cane at home                      Objective measurements completed on examination: See above findings.               PT Education - 07/24/20 1345    Education Details POC explanation of findings.  Initial HEP    Person(s) Educated Patient    Methods Explanation;Demonstration;Tactile cues;Verbal cues;Handout    Comprehension Verbalized understanding;Returned demonstration            PT Short Term Goals -  07/24/20 1358      PT SHORT TERM GOAL #1   Title Pt will be independent with intial HEP    Baseline no knowledge    Time 4    Period Weeks    Status New    Target Date 08/21/20      PT SHORT TERM GOAL #2   Title Pt will reduced 5 x STS to at least 35 sec to show improved LE strength    Baseline eval 53.21 sec     Time 4    Period Weeks    Status New    Target Date 08/21/20      PT SHORT TERM GOAL #3   Title Pt will be able to ascend/descend stairs  without pain 4/10 or less    Baseline avoids steps due to pain    Time 4    Period Weeks    Status New    Target Date 08/21/20             PT Long Term Goals - 07/24/20 1400      PT LONG TERM GOAL #1   Title Pt will be independent with all HEP given    Baseline no knowledge    Time 8    Period Weeks    Status New    Target Date 09/18/20      PT LONG TERM GOAL #2   Title FOTO will improve from 21% intake   to 45%    indicating improved functional mobility .    Baseline eval 21%    Time 8    Period Weeks    Status New    Target Date 09/18/20      PT LONG TERM GOAL #3   Title Pt will improve her L/R knee flexion to  </= 125 degrees flexion and  2/10 pain for a more functional and efficient gait pattern    Baseline R and L  115 / 110 flexion    Time 8    Period Weeks    Status New    Target Date 09/18/20      PT LONG TERM GOAL #4   Title Pt will be able to walk/stand >/= 30 minutes  with no AD with </= 2/10 pain for functional endurance and return to leisure activities post DC/ walking    Baseline cannot stand and walk greater than 15 min without 7/10 pain or more    Time 8    Period Weeks    Status New    Target Date 09/18/20      PT LONG TERM GOAL #5   Title Pt will be able to grocery shop and carry 20 lb of groceries and negotiate steps in order to demonstrate ability to live alone and home and care for own needs.    Baseline unable to carry groceries and avoids steps presently    Time 8    Period Weeks    Status New    Target Date 09/18/20      PT LONG TERM GOAL #6   Title Pt will be educated and demonstrate floor to mat x fer independently in order to demonstrate fall preparedness    Baseline unable to perform floor to mat x fer    Time 8    Period Weeks    Status New    Target Date 09/18/20                   Plan - 07/24/20  41    Clinical Impression Statement Ms Stangelo enters clinic without AD but needs one due to difficulty walking.  Pt reports she has fallen 2 x in last 6 months due to dizziness and uncontrolled DM.  Pt has hx of dengue fever and legionaires dz. so exhibits poor endurance for 2MWT for 215 feet and SOB. Pt 5 x STS 53.21 sec and is at high risk of falls.  She presently lives with her sister and is afraid to be at home alone.  sister is planning to go back to [place] to serve at an orphanage.  Pt was in [place] when she got dengue fever.  Pt hopes to increase strength in order to be able to live on her own.  Pt has crippling anxiety and  permanent skin condition in which she wishes to be covered at all times  She expressed she does not like to be around a lot of people.  PT assured we would try to address her concerns and have her see specific therapists and use a room to limit her exposure and minimizer her anxiety during PT session.  Pt also complains of Left shld pain but emphasis is not safety fall preventiona and LE strength.  Pt will benefti from skilled PT to address deficits indicated below in pain, LE weakness, Decreased AROM, falls, dizziness gait and address her avoidance of stairs. in order to maximize safety in eventually living alone. Pt may benefit from aquatic therapy as well.    Personal Factors and Comorbidities Behavior Pattern;Comorbidity 2    Comorbidities Hx of dengue fever. Hx of depression, Legionnaire's dz, granuloma annulare(chronic skin dz) high anxiety, DM, Panic attacks FALLs    Examination-Activity Limitations Sit;Squat;Stairs;Stand;Lift;Dressing;Locomotion Level;Reach Overhead    Examination-Participation Restrictions Laundry    Stability/Clinical Decision Making Evolving/Moderate complexity    Clinical Decision Making Moderate    Rehab Potential Good    PT Frequency 2x / week    PT Duration 8 weeks    PT Treatment/Interventions  ADLs/Self Care Home Management;Aquatic Therapy;Joint Manipulations;Spinal Manipulations;Dry needling;Vestibular;Passive range of motion;Taping;Manual techniques;Patient/family education;Neuromuscular re-education;Gait training;Stair training;Therapeutic activities;Therapeutic exercise;Ultrasound;Traction;Moist Heat;Iontophoresis 4mg /ml Dexamethasone;Cryotherapy;Electrical Stimulation    PT Next Visit Plan Review HEP,  Gait with cane on steps. and level   BAlancen BERGgo over FOTO report    PT Home Exercise Plan [id number]    Consulted and Agree with Plan of Care Patient           Patient will benefit from skilled therapeutic intervention in order to improve the following deficits and impairments:  Difficulty walking,Decreased activity tolerance,Decreased balance,Decreased mobility,Decreased coordination,Decreased range of motion,Decreased strength,Hypomobility,Dizziness,Increased muscle spasms,Impaired flexibility,Postural dysfunction,Improper body mechanics,Pain,Obesity,Impaired UE functional use,Impaired perceived functional ability  Visit Diagnosis: Chronic pain of both knees - Plan: PT plan of care cert/re-cert  Muscle weakness (generalized) - Plan: PT plan of care cert/re-cert  Difficulty in walking, not elsewhere classified - Plan: PT plan of care cert/re-cert  Chronic left shoulder pain - Plan: PT plan of care cert/re-cert  Low back pain with sciatica, sciatica laterality unspecified, unspecified back pain laterality, unspecified chronicity - Plan: PT plan of care cert/re-cert  Cramp and spasm - Plan: PT plan of care cert/re-cert  Repeated falls - Plan: PT plan of care cert/re-cert   Access Code: [email or web address]: [email or web address]: 03/22/2022Prepared by: Donnetta Simpers BeardsleyExercises  Supine Single Knee to Chest Stretch - 2 x daily - 7 x weekly - 1 sets - 5 reps - 10 sec hold  Supine Lower Trunk Rotation - 2 x daily - 7  x weekly - 1 sets - 5 reps - 20 sec hold  Sit to  Stand with Counter Support - 3 x daily - 7 x weekly - 1 sets - 10 reps   Problem List Patient Active Problem List   Diagnosis Date Noted  . Major depressive disorder, recurrent episode with anxious distress ([place]) 03/14/2020  . 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 History of dengue 11/03/2019  . History of Legionnaire's disease 11/03/2019   Voncille Lo, PT, [place] Certified Exercise Expert for the Aging Adult  07/24/20 2:12 PM Phone: [phone] Fax: [facility] [address] [place], [place], [address] Phone: [phone]   Fax:  [phone]  Name: Karen Whitehead MRN: [id number] Date of Birth: 1966/04/16

## 2020-07-24 NOTE — Patient Instructions (Signed)
   Voncille Lo, PT, [place] Certified Exercise Expert for the Aging Adult  07/24/20 8:45 AM Phone: [phone] Fax: [phone]

## 2020-07-25 ENCOUNTER — Other Ambulatory Visit: Payer: Self-pay | Admitting: Psychiatry

## 2020-07-25 ENCOUNTER — Other Ambulatory Visit: Payer: Self-pay | Admitting: Physician Assistant

## 2020-07-25 ENCOUNTER — Telehealth (INDEPENDENT_AMBULATORY_CARE_PROVIDER_SITE_OTHER): Payer: No Payment, Other | Admitting: Psychiatry

## 2020-07-25 ENCOUNTER — Encounter (HOSPITAL_COMMUNITY): Payer: Self-pay | Admitting: Psychiatry

## 2020-07-25 DIAGNOSIS — F339 Major depressive disorder, recurrent, unspecified: Secondary | ICD-10-CM | POA: Diagnosis not present

## 2020-07-25 MED ORDER — SERTRALINE HCL 100 MG PO TABS: ORAL_TABLET | ORAL | 1 refills | Status: DC

## 2020-07-25 MED ORDER — QUETIAPINE FUMARATE 100 MG PO TABS: 100.0000 mg | ORAL_TABLET | Freq: Every day | ORAL | 1 refills | Status: DC

## 2020-07-25 MED ORDER — CLONAZEPAM 1 MG PO TABS
1.0000 mg | ORAL_TABLET | Freq: Two times a day (BID) | ORAL | 1 refills | Status: DC | PRN
Start: 2020-07-25 — End: 2020-08-30

## 2020-07-25 MED ORDER — TRAZODONE HCL 100 MG PO TABS: ORAL_TABLET | ORAL | 1 refills | Status: DC

## 2020-07-25 MED FILL — ?ATORVASTATIN 20 MG TABLET: 20 | 30 days supply | Qty: 30 | Fill #2

## 2020-07-25 MED FILL — glipiZIDE 5 MG TABS: 5 | 30 days supply | Qty: 30 | Fill #2

## 2020-07-25 MED FILL — !VENTOLIN HFA INHALER: 108 (90 BAS | 25 days supply | Qty: 18 | Fill #0

## 2020-07-25 MED FILL — QUETIAPINE FUMARATE 100 MG: 100 | 30 days supply | Qty: 30 | Fill #0

## 2020-07-25 MED FILL — TRAZODONE HCL 100 MG TABS: 100 | 30 days supply | Qty: 60 | Fill #0

## 2020-07-25 MED FILL — ?SERTRALINE HCL 100 MG TABS: 100 | 30 days supply | Qty: 60 | Fill #1

## 2020-07-25 NOTE — Progress Notes (Signed)
[facility] Progress Note  Virtual Visit via Telephone Note  I connected with Karen Whitehead on 07/25/20 at  3:40 PM EDT by telephone and verified that I am speaking with the correct person using two identifiers.  Location: Patient: home Provider: Clinic   I discussed the limitations, risks, security and privacy concerns of performing an evaluation and management service by telephone and the availability of in person appointments. I also discussed with the patient that there may be a patient responsible charge related to this service. The patient expressed understanding and agreed to proceed.   I provided 16 minutes of non-face-to-face time during this encounter.      Patient Identification: Karen Whitehead MRN:  [id number] Date of Evaluation:  07/25/2020    Chief Complaint:   " I am very anxious and nervous all the time."  Visit Diagnosis:    ICD-10-CM   1. Major depressive disorder, recurrent episode with anxious distress (HCC)  F33.9     History of Present Illness: Patient reported that she still continues to feel very anxious and nervous all the time.  She stated that she has negative thoughts racing her mind pretty much all the time.  She is unable to relax and has a hard time falling asleep and staying asleep.  She stated that clonazepam and trazodone together at bedtime to help her fall asleep but she wakes up after 2 to 3 hours and then has a hard time going back to sleep. She stated that she does not know how to stop worrying. She stated that her son finally found another job and he is doing well however he has a busy schedule and he is being told in several different directions at work.  Her son does not want the patient to be left alone and therefore he has spoken with her sister.  Patient sister is insisting that patient returns to her overseas so that she can take care of her while living with her however patient is not agreeable to that. She is worried about this and thinking  about always negative possibilities. She also mentioned that she can keep her appointment with Ms. Ileene Patrick because of technical issues with her phone.  She asked if she has an appointment scheduled for Ms. Ileene Patrick in the future and Probation officer informed her that there is an appointment scheduled for her on April 5.  Patient stated that she surely will keep that appointment. Patient stated that she is still not sleeping well and does not know what to do. Writer recommended adding Seroquel to her regimen which will help with her anxiety and augment sertraline as an antidepressant.  It will also help with her sleep.  Patient was willing to try that. Potential side effects of medication and risks vs benefits of treatment vs non-treatment were explained and discussed. All questions were answered.   Past Psychiatric History: MDD, anxiety  Previous Psychotropic Medications: Yes   Substance Abuse History in the last 12 months:  No.  Consequences of Substance Abuse: NA  Past Medical History:  Past Medical History:  Diagnosis Date  . Granuloma annulare     Past Surgical History:  Procedure Laterality Date  . APPENDECTOMY      Family Psychiatric History: denied  Family History: No family history on file.  Social History:   Social History   Socioeconomic History  . Marital status: Single    Spouse name: Not on file  . Number of children: Not on file  . Years  of education: Not on file  . Highest education level: Not on file  Occupational History  . Not on file  Tobacco Use  . Smoking status: Current Every Day Smoker    Packs/day: 0.50    Types: Cigarettes  . Smokeless tobacco: Never Used  Substance and Sexual Activity  . Alcohol use: No  . Drug use: No  . Sexual activity: Not on file  Other Topics Concern  . Not on file  Social History Narrative  . Not on file   Social Determinants of Health   Financial Resource Strain: Not on file  Food Insecurity: Not on file  Transportation  Needs: Not on file  Physical Activity: Not on file  Stress: Not on file  Social Connections: Not on file    Additional Social History: Lives with her 44 y/o son  Allergies:  No Known Allergies  Metabolic Disorder Labs: Lab Results  Component Value Date   HGBA1C 6.1 02/06/2020   No results found for: PROLACTIN No results found for: CHOL, TRIG, HDL, CHOLHDL, VLDL, LDLCALC No results found for: TSH  Therapeutic Level Labs: No results found for: LITHIUM No results found for: CBMZ No results found for: VALPROATE  Current Medications: Current Outpatient Medications  Medication Sig Dispense Refill  . albuterol (VENTOLIN HFA) 108 (90 Base) MCG/ACT inhaler INHALE 1-2 PUFFS INTO THE LUNGS EVERY 6 (SIX) HOURS AS NEEDED FOR WHEEZING OR SHORTNESS OF BREATH. 18 g 1  . atorvastatin (LIPITOR) 20 MG tablet Take 1 tablet (20 mg total) by mouth daily. 30 tablet 3  . Blood Glucose Monitoring Suppl (TRUE METRIX METER) w/Device KIT 1 each by Does not apply route in the morning and at bedtime. 1 kit 0  . clonazePAM (KLONOPIN) 1 MG tablet Take 1 tablet (1 mg total) by mouth 2 (two) times daily as needed for anxiety. 60 tablet 1  . cyclobenzaprine (FLEXERIL) 10 MG tablet Take 1 tablet (10 mg total) by mouth at bedtime. Prn muscle pain 30 tablet 3  . dapagliflozin propanediol (FARXIGA) 5 MG TABS tablet Take 1 tablet (5 mg total) by mouth daily before breakfast. 30 tablet 3  . fluticasone (CUTIVATE) 0.005 % ointment Apply 1 application topically 2 (two) times daily.    Marland Kitchen glipiZIDE (GLUCOTROL) 5 MG tablet Take 0.5 tablets (2.5 mg total) by mouth 2 (two) times daily before a meal. 30 tablet 3  . glucose blood (TRUE METRIX BLOOD GLUCOSE TEST) test strip Use as instructed 100 each 12  . meloxicam (MOBIC) 7.5 MG tablet Take 1 tablet (7.5 mg total) by mouth daily. 30 tablet 3  . sertraline (ZOLOFT) 100 MG tablet Take 2 tablets daily (200 mg) 60 tablet 1  . traZODone (DESYREL) 100 MG tablet Take 2 tablets at  bedtime 60 tablet 1  . TRUEplus Lancets 28G MISC 1 each by Does not apply route in the morning and at bedtime. 100 each 1   No current facility-administered medications for this visit.    Psychiatric Specialty Exam: Review of Systems  There were no vitals taken for this visit.There is no height or weight on file to calculate BMI.  General Appearance: unable to assess due to phone visit  Eye Contact:  unable to assess due to phone visit  Speech:  Clear and Coherent and Normal Rate  Volume:  Normal  Mood:  Anxious, Depressed and Dysphoric  Affect:  Congruent  Thought Process:  Goal Directed and Descriptions of Associations: Intact  Orientation:  Full (Time, Place, and Person)  Thought Content:  Logical  Suicidal Thoughts:  No  Homicidal Thoughts:  No  Memory:  Immediate;   Good Recent;   Good  Judgement:  Fair  Insight:  Fair  Psychomotor Activity:  Normal  Concentration:  Concentration: Good and Attention Span: Good  Recall:  Good  Fund of Knowledge:Good  Language: Good  Akathisia:  Negative  Handed:  Right  AIMS (if indicated):  0  Assets:  Communication Skills Desire for Improvement Financial Resources/Insurance Housing  ADL's:  Intact  Cognition: WNL  Sleep:  Poor   Screenings: GAD-7   Physiological scientist Office Visit from 02/06/2020 in [place] from 11/16/2019 in [place] Office Visit from 11/03/2019 in [place]  Total GAD-7 Score $RemoveBef'17 16 18    'modPEhmexC$ PHQ2-9   [place] Office Visit from 02/06/2020 in [place] from 11/16/2019 in [place] Office Visit from 11/03/2019 in [place]  PHQ-2 Total Score $RemoveBef'6 6 6  'wkEpZPCWQk$ PHQ-9 Total Score $RemoveBef'24 22 21    'FTXvmkPVSK$ [place] from 11/24/2019 in [place] from 11/16/2019 in [place] and Plan: Patient is continuing to report overwhelming anxiety as well as poor sleep.  She is not sure how to calm herself down because she is worried about so many different things going on in her personal life.  She was agreeable to adding Seroquel to her regimen to see if that would augment sertraline as well as help with anxiety and insomnia. Potential side effects of medication and risks vs benefits of treatment vs non-treatment were explained and discussed. All questions were answered.   1. Major depressive disorder, recurrent episode with anxious distress (HCC)  - Start QUEtiapine (SEROQUEL) 100 MG tablet; Take 1 tablet (100 mg total) by mouth at bedtime.  Dispense: 30 tablet; Refill: 1 - traZODone (DESYREL) 100 MG tablet; Take 2 tablets at bedtime  Dispense: 60 tablet; Refill: 1 -Continue sertraline (ZOLOFT) 100 MG tablet; Take 2 tablets daily (200 mg)  Dispense: 60 tablet; Refill: 1 -Continue clonazePAM (KLONOPIN) 1 MG tablet; Take 1 tablet (1 mg total) by mouth 2 (two) times daily as needed for anxiety.  Dispense: 60 tablet; Refill: 1   Continue therapy with Ms. Hatch. F/up in 6 weeks.    [place] Crane, MD 3/23/20223:37 PM

## 2020-07-31 ENCOUNTER — Other Ambulatory Visit: Payer: Self-pay

## 2020-07-31 ENCOUNTER — Other Ambulatory Visit: Payer: Self-pay | Admitting: Family Medicine

## 2020-07-31 ENCOUNTER — Telehealth: Payer: Self-pay

## 2020-07-31 ENCOUNTER — Ambulatory Visit: Payer: Self-pay | Attending: Family Medicine | Admitting: Family Medicine

## 2020-07-31 ENCOUNTER — Encounter: Payer: Self-pay | Admitting: Family Medicine

## 2020-07-31 VITALS — BP 144/84 | HR 92 | Ht 64.0 in | Wt 211.2 lb

## 2020-07-31 DIAGNOSIS — M791 Myalgia, unspecified site: Secondary | ICD-10-CM

## 2020-07-31 DIAGNOSIS — M25561 Pain in right knee: Secondary | ICD-10-CM

## 2020-07-31 DIAGNOSIS — M5412 Radiculopathy, cervical region: Secondary | ICD-10-CM

## 2020-07-31 DIAGNOSIS — M25562 Pain in left knee: Secondary | ICD-10-CM

## 2020-07-31 DIAGNOSIS — G8929 Other chronic pain: Secondary | ICD-10-CM

## 2020-07-31 DIAGNOSIS — M549 Dorsalgia, unspecified: Secondary | ICD-10-CM

## 2020-07-31 DIAGNOSIS — E1165 Type 2 diabetes mellitus with hyperglycemia: Secondary | ICD-10-CM

## 2020-07-31 DIAGNOSIS — L920 Granuloma annulare: Secondary | ICD-10-CM

## 2020-07-31 DIAGNOSIS — M7502 Adhesive capsulitis of left shoulder: Secondary | ICD-10-CM

## 2020-07-31 LAB — POCT GLYCOSYLATED HEMOGLOBIN (HGB A1C): HbA1c, POC (controlled diabetic range): 5.6 % (ref 0.0–7.0)

## 2020-07-31 LAB — GLUCOSE, POCT (MANUAL RESULT ENTRY): POC Glucose: 196 mg/dl — AB (ref 70–99)

## 2020-07-31 MED ORDER — GLIPIZIDE 5 MG PO TABS: 2.5000 mg | ORAL_TABLET | Freq: Two times a day (BID) | ORAL | 3 refills | Status: DC

## 2020-07-31 MED ORDER — DAPAGLIFLOZIN PROPANEDIOL 5 MG PO TABS: 5.0000 mg | ORAL_TABLET | Freq: Every day | ORAL | 3 refills | Status: DC

## 2020-07-31 MED ORDER — CYCLOBENZAPRINE HCL 10 MG PO TABS: 10.0000 mg | ORAL_TABLET | Freq: Every day | ORAL | 3 refills | Status: DC

## 2020-07-31 MED ORDER — GABAPENTIN 300 MG PO CAPS: 300.0000 mg | ORAL_CAPSULE | Freq: Every day | ORAL | 3 refills | Status: DC

## 2020-07-31 MED ORDER — ATORVASTATIN CALCIUM 20 MG PO TABS: 20.0000 mg | ORAL_TABLET | Freq: Every day | ORAL | 3 refills | Status: DC

## 2020-07-31 MED ORDER — MELOXICAM 7.5 MG PO TABS: 7.5000 mg | ORAL_TABLET | Freq: Every day | ORAL | 3 refills | Status: DC

## 2020-07-31 NOTE — Telephone Encounter (Signed)
Patient provided RW from [facility]

## 2020-07-31 NOTE — Progress Notes (Signed)
Subjective:  Patient ID: Karen Whitehead, female    DOB: 09-Aug-1965  Age: 55 y.o. MRN: [id number]  CC: Diabetes   HPI Karen Whitehead is a 25 year olf female with Type2 DM (A1c5.6), anxiety and depression, insomnia who presents todayfor follow-up visit. Last seen by her psychiatrist Dr.Kaur 1 week ago and Seroquel was added to her regimen.  She has been falling as her legs give out and is requesting a walker or cane. She has been undergoing PT for her legs but will like to have more therapy on L shoulder and her entire back. When she tries to stand her entire back hurts. She is unable to raise her L shoulder and it has been going on for a months. Complains her muscles lock up and she falls over. She is unable to sleep as her pain wakes her up.  She would like a pain management referral.  States he sugars are up and down - 140, 190; her lowest sugar was 90, but her A1c is 5.6 today.  She is doing well on Farxiga and glipizide with no hypoglycemic episodes.  Past Medical History:  Diagnosis Date  . Granuloma annulare     Past Surgical History:  Procedure Laterality Date  . APPENDECTOMY      No family history on file.  No Known Allergies  Outpatient Medications Prior to Visit  Medication Sig Dispense Refill  . albuterol (VENTOLIN HFA) 108 (90 Base) MCG/ACT inhaler INHALE 1-2 PUFFS INTO THE LUNGS EVERY 6 (SIX) HOURS AS NEEDED FOR WHEEZING OR SHORTNESS OF BREATH. 18 g 1  . atorvastatin (LIPITOR) 20 MG tablet Take 1 tablet (20 mg total) by mouth daily. 30 tablet 3  . Blood Glucose Monitoring Suppl (TRUE METRIX METER) w/Device KIT 1 each by Does not apply route in the morning and at bedtime. 1 kit 0  . clonazePAM (KLONOPIN) 1 MG tablet Take 1 tablet (1 mg total) by mouth 2 (two) times daily as needed for anxiety. 60 tablet 1  . cyclobenzaprine (FLEXERIL) 10 MG tablet Take 1 tablet (10 mg total) by mouth at bedtime. Prn muscle pain 30 tablet 3  . dapagliflozin propanediol (FARXIGA) 5  MG TABS tablet Take 1 tablet (5 mg total) by mouth daily before breakfast. 30 tablet 3  . fluticasone (CUTIVATE) 0.005 % ointment Apply 1 application topically 2 (two) times daily.    Marland Kitchen glipiZIDE (GLUCOTROL) 5 MG tablet Take 0.5 tablets (2.5 mg total) by mouth 2 (two) times daily before a meal. 30 tablet 3  . glucose blood (TRUE METRIX BLOOD GLUCOSE TEST) test strip Use as instructed 100 each 12  . meloxicam (MOBIC) 7.5 MG tablet Take 1 tablet (7.5 mg total) by mouth daily. 30 tablet 3  . QUEtiapine (SEROQUEL) 100 MG tablet Take 1 tablet (100 mg total) by mouth at bedtime. 30 tablet 1  . sertraline (ZOLOFT) 100 MG tablet Take 2 tablets daily (200 mg) 60 tablet 1  . traZODone (DESYREL) 100 MG tablet Take 2 tablets at bedtime 60 tablet 1  . TRUEplus Lancets 28G MISC 1 each by Does not apply route in the morning and at bedtime. 100 each 1   No facility-administered medications prior to visit.     ROS Review of Systems  Constitutional: Negative for activity change, appetite change and fatigue.  HENT: Negative for congestion, sinus pressure and sore throat.   Eyes: Negative for visual disturbance.  Respiratory: Negative for cough, chest tightness, shortness of breath and wheezing.  Cardiovascular: Negative for chest pain and palpitations.  Gastrointestinal: Negative for abdominal distention, abdominal pain and constipation.  Endocrine: Negative for polydipsia.  Genitourinary: Negative for dysuria and frequency.  Musculoskeletal:       See HPI  Skin: Negative for rash.  Neurological: Negative for tremors, light-headedness and numbness.  Hematological: Does not bruise/bleed easily.  Psychiatric/Behavioral: Positive for sleep disturbance. Negative for agitation and behavioral problems.    Objective:  BP (!) 144/84   Pulse 92   Ht $R'5\' 4"'oL$  (1.626 m)   Wt 211 lb 3.2 oz (95.8 kg)   SpO2 95%   BMI 36.25 kg/m   BP/Weight 07/31/2020 02/06/2020 1/61/0960  Systolic BP 454 098 119  Diastolic BP  84 73 71  Wt. (Lbs) 211.2 202 216  BMI 36.25 34.67 37.08      Physical Exam Constitutional:      Appearance: She is well-developed.  Neck:     Vascular: No JVD.  Cardiovascular:     Rate and Rhythm: Normal rate.     Heart sounds: Normal heart sounds. No murmur heard.   Pulmonary:     Effort: Pulmonary effort is normal.     Breath sounds: Normal breath sounds. No wheezing or rales.  Chest:     Chest wall: No tenderness.  Abdominal:     General: Bowel sounds are normal. There is no distension.     Palpations: Abdomen is soft. There is no mass.     Tenderness: There is no abdominal tenderness.  Musculoskeletal:     Cervical back: Rigidity present.     Right lower leg: No edema.     Left lower leg: No edema.     Comments: Denies to palpation of entire spine. Range of motion of left shoulder restricted to 60 degrees with associated tenderness on motion beyond that. Range of motion of right shoulder restricted to 90 degrees. Tenderness on range of motion of left knee  Skin:    Comments: Large hypopigmented patches distributed on extremities  Neurological:     Mental Status: She is alert and oriented to person, place, and time.     Comments: Handgrip in left hand-3+/5; handgrip in right hand 5/5  Psychiatric:     Comments: Dysphoric mood     CMP Latest Ref Rng & Units 10/16/2019 10/16/2019 02/19/2016  Glucose 70 - 99 mg/dL - 153(H) 111(H)  BUN 6 - 20 mg/dL - 7 7  Creatinine 0.44 - 1.00 mg/dL - 0.87 0.87  Sodium 135 - 145 mmol/L 139 138 139  Potassium 3.5 - 5.1 mmol/L 3.4(L) 3.5 4.0  Chloride 98 - 111 mmol/L - 100 106  CO2 22 - 32 mmol/L - 24 25  Calcium 8.9 - 10.3 mg/dL - 9.8 9.8    Lipid Panel  No results found for: CHOL, TRIG, HDL, CHOLHDL, VLDL, LDLCALC, LDLDIRECT  CBC    Component Value Date/Time   WBC 10.5 10/16/2019 1515   RBC 4.82 10/16/2019 1515   HGB 15.6 (H) 10/16/2019 1751   HCT 46.0 10/16/2019 1751   PLT 289 10/16/2019 1515   MCV 92.5 10/16/2019  1515   MCH 31.5 10/16/2019 1515   MCHC 34.1 10/16/2019 1515   RDW 12.6 10/16/2019 1515   LYMPHSABS 3.0 11/10/2018 1315   MONOABS 0.6 11/10/2018 1315   EOSABS 0.3 11/10/2018 1315   BASOSABS 0.1 11/10/2018 1315    Lab Results  Component Value Date   HGBA1C 5.6 07/31/2020    Assessment & Plan:  1. Type 2 diabetes  mellitus with hyperglycemia, without long-term current use of insulin (HCC) Controlled with A1c of 5.6 Continue current regimen Counseled on Diabetic diet, my plate method, 295 minutes of moderate intensity exercise/week Blood sugar logs with fasting goals of 80-120 mg/dl, random of less than 180 and in the event of sugars less than 60 mg/dl or greater than 400 mg/dl encouraged to notify the clinic. Advised on the need for annual eye exams, annual foot exams, Pneumonia vaccine. - POCT glucose (manual entry) - POCT glycosylated hemoglobin (Hb A1C) - CMP14+EGFR; Future - Lipid panel; Future - atorvastatin (LIPITOR) 20 MG tablet; Take 1 tablet (20 mg total) by mouth daily.  Dispense: 30 tablet; Refill: 3 - dapagliflozin propanediol (FARXIGA) 5 MG TABS tablet; Take 1 tablet (5 mg total) by mouth daily before breakfast.  Dispense: 30 tablet; Refill: 3 - glipiZIDE (GLUCOTROL) 5 MG tablet; Take 0.5 tablets (2.5 mg total) by mouth 2 (two) times daily before a meal.  Dispense: 30 tablet; Refill: 3  2. Adhesive capsulitis of left shoulder - Ambulatory referral to Physical Therapy  3. Musculoskeletal back pain Uncontrolled Currently on Flexeril, Mobic She might benefit from Cymbalta and is advised to discuss this with her psychiatrist - Ambulatory referral to Pain Clinic - Ambulatory referral to Physical Therapy  4. Cervical radiculopathy See #3 above - Ambulatory referral to Pain Clinic - Ambulatory referral to Physical Therapy  5. 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 Chronic pain of both knees Likely underlying osteoarthritis Undergoing PT On oral  NSAID - Ambulatory referral to Pain Clinic - meloxicam (MOBIC) 7.5 MG tablet; Take 1 tablet (7.5 mg total) by mouth daily.  Dispense: 30 tablet; Refill: 3  7. Muscle pain - cyclobenzaprine (FLEXERIL) 10 MG tablet; Take 1 tablet (10 mg total) by mouth at bedtime. Prn muscle pain  Dispense: 30 tablet; Refill: 3    No orders of the defined types were placed in this encounter.   Return in about 3 months (around 10/31/2020) for Chronic disease management.       Charlott Rakes, MD, FAAFP. [facility] and [place], [place]   07/31/2020, 4:21 PM

## 2020-08-07 ENCOUNTER — Ambulatory Visit (INDEPENDENT_AMBULATORY_CARE_PROVIDER_SITE_OTHER): Payer: No Payment, Other | Admitting: Licensed Clinical Social Worker

## 2020-08-07 ENCOUNTER — Other Ambulatory Visit: Payer: Self-pay

## 2020-08-07 ENCOUNTER — Encounter: Payer: Self-pay | Admitting: Physical Medicine and Rehabilitation

## 2020-08-07 DIAGNOSIS — F418 Other specified anxiety disorders: Secondary | ICD-10-CM | POA: Diagnosis not present

## 2020-08-08 ENCOUNTER — Ambulatory Visit: Payer: Self-pay | Attending: Family Medicine

## 2020-08-08 ENCOUNTER — Other Ambulatory Visit: Payer: Self-pay

## 2020-08-08 DIAGNOSIS — M25562 Pain in left knee: Secondary | ICD-10-CM | POA: Insufficient documentation

## 2020-08-08 DIAGNOSIS — R252 Cramp and spasm: Secondary | ICD-10-CM | POA: Insufficient documentation

## 2020-08-08 DIAGNOSIS — M5440 Lumbago with sciatica, unspecified side: Secondary | ICD-10-CM | POA: Insufficient documentation

## 2020-08-08 DIAGNOSIS — M25512 Pain in left shoulder: Secondary | ICD-10-CM | POA: Insufficient documentation

## 2020-08-08 DIAGNOSIS — M25561 Pain in right knee: Secondary | ICD-10-CM | POA: Insufficient documentation

## 2020-08-08 DIAGNOSIS — G8929 Other chronic pain: Secondary | ICD-10-CM | POA: Insufficient documentation

## 2020-08-08 DIAGNOSIS — R296 Repeated falls: Secondary | ICD-10-CM | POA: Insufficient documentation

## 2020-08-08 DIAGNOSIS — R262 Difficulty in walking, not elsewhere classified: Secondary | ICD-10-CM | POA: Insufficient documentation

## 2020-08-08 DIAGNOSIS — M6281 Muscle weakness (generalized): Secondary | ICD-10-CM | POA: Insufficient documentation

## 2020-08-08 NOTE — Therapy (Signed)
[place], [place], [address] Phone: [phone]   Fax:  [phone]  Physical Therapy Treatment  Patient Details  Name: Karen Whitehead MRN: [id number] Date of Birth: [birth year] Referring Provider (PT): Charlott Rakes MD   Encounter Date: 08/08/2020   PT End of Session - 08/08/20 1810    Visit Number 2    Number of Visits 16    Date for PT Re-Evaluation 09/04/20    Authorization Type CAFA  6th visit FOTO  10th visit FOTO    PT Start Time 2542   patient late   PT Stop Time 1845    PT Time Calculation (min) 35 min    Equipment Utilized During Treatment Gait belt;Other (comment)   RW   Activity Tolerance Patient tolerated treatment well    Behavior During Therapy WFL for tasks assessed/performed;Flat affect           Past Medical History:  Diagnosis Date  . Granuloma annulare     Past Surgical History:  Procedure Laterality Date  . APPENDECTOMY      There were no vitals filed for this visit.   Subjective Assessment - 08/08/20 1813    Subjective Patient reports she has not been feeling too good. She is having pain in her Lt shoulder, back, and knees. She is now using RW. She reports shooting pain in her back when she stands. She denies any falls since evaluation, though reports LOB due to dizziness/diabetes.    Currently in Pain? Yes    Pain Score 10-Worst pain ever    Pain Location Generalized   Lt shoulder, knee and back   Pain Orientation Left    Pain Descriptors / Indicators Tightness    Pain Type Chronic pain                             [facility] Adult PT Treatment/Exercise - 08/08/20 0001      Lumbar Exercises: Stretches   Lower Trunk Rotation 60 seconds      Lumbar Exercises: Supine   Clam 10 reps    Clam Limitations x2 red theraband      Knee/Hip Exercises: Standing   Gait Training fit RW to appropriate height and focused on heel strike and maintaining body in  center of RW.      Knee/Hip Exercises: Supine   Hip Adduction Isometric 10 reps    Hip Adduction Isometric Limitations x 2      Shoulder Exercises: Supine   Flexion 5 reps    Flexion Limitations Lt AAROM utilizing Rt arm to lift                    PT Short Term Goals - 07/24/20 1358      PT SHORT TERM GOAL #1   Title Pt will be independent with intial HEP    Baseline no knowledge    Time 4    Period Weeks    Status New    Target Date 08/21/20      PT SHORT TERM GOAL #2   Title Pt will reduced 5 x STS to at least 35 sec to show improved LE strength    Baseline eval 53.21 sec    Time 4    Period Weeks    Status New    Target Date 08/21/20      PT SHORT TERM GOAL #3   Title Pt will be  able to ascend/descend stairs  without pain 4/10 or less    Baseline avoids steps due to pain    Time 4    Period Weeks    Status New    Target Date 08/21/20             PT Long Term Goals - 07/24/20 1400      PT LONG TERM GOAL #1   Title Pt will be independent with all HEP given    Baseline no knowledge    Time 8    Period Weeks    Status New    Target Date 09/18/20      PT LONG TERM GOAL #2   Title FOTO will improve from 21% intake   to 45%    indicating improved functional mobility .    Baseline eval 21%    Time 8    Period Weeks    Status New    Target Date 09/18/20      PT LONG TERM GOAL #3   Title Pt will improve her L/R knee flexion to  </= 125 degrees flexion and  2/10 pain for a more functional and efficient gait pattern    Baseline R and L  115 / 110 flexion    Time 8    Period Weeks    Status New    Target Date 09/18/20      PT LONG TERM GOAL #4   Title Pt will be able to walk/stand >/= 30 minutes  with no AD with </= 2/10 pain for functional endurance and return to leisure activities post DC/ walking    Baseline cannot stand and walk greater than 15 min without 7/10 pain or more    Time 8    Period Weeks    Status New    Target Date 09/18/20       PT LONG TERM GOAL #5   Title Pt will be able to grocery shop and carry 20 lb of groceries and negotiate steps in order to demonstrate ability to live alone and home and care for own needs.    Baseline unable to carry groceries and avoids steps presently    Time 8    Period Weeks    Status New    Target Date 09/18/20      PT LONG TERM GOAL #6   Title Pt will be educated and demonstrate floor to mat x fer independently in order to demonstrate fall preparedness    Baseline unable to perform floor to mat x fer    Time 8    Period Weeks    Status New    Target Date 09/18/20                 Plan - 08/08/20 1840    Clinical Impression Statement Session was limited as patient was late for scheduled appointment. Additional referral was placed for Lt shoulder and back pain from MD, though these areas were assessed on initial evaluation as well and will be addressed within current POC. Asked patient what she feels is the most important deficits to address first as she has reports pain in multiple joints and difficulty with all ADLs. Patient reported she felt that she needed to focus on "everything." Session focused on gait training with RW that she recently obtained from MD and general hip strengtheng and trunk/shoulder mobility. Patient brought up her anxiety and fear of people multiple times throughout session, though overall tolerated session well today and was  able to complete exericses in open therapy gym.    Personal Factors and Comorbidities Behavior Pattern;Comorbidity 2    Comorbidities Hx of dengue fever. Hx of depression, Legionnaire's dz, granuloma annulare(chronic skin dz) high anxiety, DM, Panic attacks FALLs    Examination-Activity Limitations Sit;Squat;Stairs;Stand;Lift;Dressing;Locomotion Level;Reach Overhead    Examination-Participation Restrictions Laundry    Stability/Clinical Decision Making Evolving/Moderate complexity    Rehab Potential Good    PT Frequency 2x /  week    PT Duration 8 weeks    PT Treatment/Interventions ADLs/Self Care Home Management;Aquatic Therapy;Joint Manipulations;Spinal Manipulations;Dry needling;Vestibular;Passive range of motion;Taping;Manual techniques;Patient/family education;Neuromuscular re-education;Gait training;Stair training;Therapeutic activities;Therapeutic exercise;Ultrasound;Traction;Moist Heat;Iontophoresis 4mg /ml Dexamethasone;Cryotherapy;Electrical Stimulation    PT Next Visit Plan Review HEP,  Gait training/stair training. go over FOTO report. general UE/LE strengthening.    PT Home Exercise Plan [id number]    Consulted and Agree with Plan of Care Patient           Patient will benefit from skilled therapeutic intervention in order to improve the following deficits and impairments:  Difficulty walking,Decreased activity tolerance,Decreased balance,Decreased mobility,Decreased coordination,Decreased range of motion,Decreased strength,Hypomobility,Dizziness,Increased muscle spasms,Impaired flexibility,Postural dysfunction,Improper body mechanics,Pain,Obesity,Impaired UE functional use,Impaired perceived functional ability  Visit Diagnosis: Chronic pain of both knees  Muscle weakness (generalized)  Difficulty in walking, not elsewhere classified  Chronic left shoulder pain  Low back pain with sciatica, sciatica laterality unspecified, unspecified back pain laterality, unspecified chronicity  Cramp and spasm  Repeated falls     Problem List Patient Active Problem List   Diagnosis Date Noted  . Major depressive disorder, recurrent episode with anxious distress ([facility]) 03/14/2020  . 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 History of dengue 11/03/2019  . History of Legionnaire's disease 11/03/2019   Gwendolyn Grant, PT, DPT, ATC 08/08/20 7:46 PM  [facility] [address] [place], [place], [address] Phone: [phone]   Fax:  [phone]  Name: Karen Whitehead MRN: [id number] Date of Birth: [birth year]

## 2020-08-09 NOTE — Progress Notes (Signed)
   THERAPIST PROGRESS NOTE   Virtual Visit via Video Note  I connected with [place] on 08/09/20 at  2:00 PM EDT by a video enabled telemedicine application and verified that I am speaking with the correct person using two identifiers.  Location: Patient: Home Provider: [facility]   I discussed the limitations of evaluation and management by telemedicine and the availability of in person appointments. The patient expressed understanding and agreed to proceed. I discussed the assessment and treatment plan with the patient. The patient was provided an opportunity to ask questions and all were answered. The patient agreed with the plan and demonstrated an understanding of the instructions.   I provided 40 minutes of non-face-to-face time during this encounter.  Participation Level: Active  Behavioral Response: CasualAlertAnxious and Depressed  Type of Therapy: Individual Therapy  Treatment Goals addressed: Communication: dep/anx/coping  Interventions: CBT and Supportive  Summary: Karen Whitehead is a 55 y.o. female who presents with hx of dep/anx. This date pt signs on for video session. She reports she has returned to a more depressive state with high levels of anx if she tries to leave the house. She denies any panic attacks. Pt advises her sister did come home but has already left again. Pt states she went out with sister twice while she was in town. Sister out of country again with no expected return until July. LCSW assessed for status of household income. Pt reports her son got work as a Programme researcher, broadcasting/film/video at [employer] and is doing well. She states they are in a better financial place and he is also able to bring food home at no cost. He has been working ~3 wks. Pt states she is planning to go out to PT tomorrow and will be using [facility] transportation, going alone. LCSW commended pt for this and provided encouragement. She states she has PT 2xwk for 8 wks. Pt advises she has gotten a walker  to use and she is able to walk better since getting device. She denies she has been on any walks outside but reports she is walking more inside. Pt has had med adjustment with Seroquel added. Pt feels this is helping but may need to be increased. Pt has not been on this long, which was discussed, education provided. She will talk with med provider at next appt. Remainder of session spent addressing coping with ongoing depression including self talk strategies, deep breathing, meditation. LCSW reviewed poc prior to close of session. Pt states appreciation for care.     Suicidal/Homicidal: Nowithout intent/plan  Therapist Response: Pt remains receptive to care.  Plan: Return again in 4 weeks.  Diagnosis: Axis I: depression with anxiety  Hermine Messick, LCSW 08/09/2020

## 2020-08-13 ENCOUNTER — Ambulatory Visit: Payer: Self-pay | Admitting: Physical Therapy

## 2020-08-15 ENCOUNTER — Ambulatory Visit: Payer: Self-pay

## 2020-08-15 ENCOUNTER — Other Ambulatory Visit: Payer: Self-pay

## 2020-08-15 DIAGNOSIS — M6281 Muscle weakness (generalized): Secondary | ICD-10-CM

## 2020-08-15 DIAGNOSIS — M5440 Lumbago with sciatica, unspecified side: Secondary | ICD-10-CM

## 2020-08-15 DIAGNOSIS — G8929 Other chronic pain: Secondary | ICD-10-CM

## 2020-08-15 DIAGNOSIS — R296 Repeated falls: Secondary | ICD-10-CM

## 2020-08-15 DIAGNOSIS — R262 Difficulty in walking, not elsewhere classified: Secondary | ICD-10-CM

## 2020-08-15 DIAGNOSIS — R252 Cramp and spasm: Secondary | ICD-10-CM

## 2020-08-15 NOTE — Therapy (Signed)
[place], [place], [address] Phone: [phone]   Fax:  [phone]  Physical Therapy Treatment  Patient Details  Name: Karen Whitehead MRN: [id number] Date of Birth: 08/25/1965 Referring Provider (PT): Charlott Rakes MD   Encounter Date: 08/15/2020   PT End of Session - 08/15/20 1312    Visit Number 3    Number of Visits 16    Date for PT Re-Evaluation 09/04/20    Authorization Type CAFA  6th visit FOTO  10th visit FOTO    PT Start Time 1312    PT Stop Time 1356    PT Time Calculation (min) 44 min    Equipment Utilized During Treatment --   RW   Activity Tolerance Patient limited by pain    Behavior During Therapy [facility] for tasks assessed/performed;Flat affect           Past Medical History:  Diagnosis Date  . Granuloma annulare     Past Surgical History:  Procedure Laterality Date  . APPENDECTOMY      There were no vitals filed for this visit.   Subjective Assessment - 08/15/20 1318    Subjective "I feel terrrible." She reports pain all over, but mostly in the Lt shoulder and knee. She has been trying to complete HEP. She had one fall since last session getting out of the tub.    Currently in Pain? Yes    Pain Score 10-Worst pain ever    Pain Location Generalized   worst in the Lt knee and Lt shoulder   Pain Orientation Left    Pain Descriptors / Indicators Tightness    Pain Type Chronic pain    Pain Frequency Constant              [facility] PT Assessment - 08/15/20 0001      Transfers   Five time sit to stand comments  unable to complete (able to complete 3 reps in 80 sec)                         [facility] Adult PT Treatment/Exercise - 08/15/20 0001      Lumbar Exercises: Aerobic   Nustep 5 minute level 3 LE only      Knee/Hip Exercises: Seated   Long Arc Quad 10 reps    Long Arc Quad Limitations x2; partial range LLE    Other Seated Knee/Hip Exercises seated calf  raise 1 x 10    Marching 10 reps    Marching Limitations x 2; partial range LLE      Shoulder Exercises: Seated   Other Seated Exercises stability ball rollout 1 x 10                  PT Education - 08/15/20 1401    Education Details Updated HEP. Began PNE.    Person(s) Educated Patient    Methods Explanation;Demonstration;Verbal cues;Handout    Comprehension Verbalized understanding;Returned demonstration;Verbal cues required;Need further instruction            PT Short Term Goals - 08/15/20 1321      PT SHORT TERM GOAL #1   Title Pt will be independent with intial HEP    Baseline --    Time 4    Period Weeks    Status Achieved    Target Date 08/21/20      PT SHORT TERM GOAL #2   Title Pt will reduced 5 x  STS to at least 35 sec to show improved LE strength    Baseline eval 53.21 sec; unable 08/15/20    Time 4    Period Weeks    Status Not Met    Target Date 08/21/20      PT SHORT TERM GOAL #3   Title Pt will be able to ascend/descend stairs  without pain 4/10 or less    Baseline is using RW for stairs at home, reports a lot of difficulty.    Time 4    Period Weeks    Status On-going    Target Date 08/21/20             PT Long Term Goals - 07/24/20 1400      PT LONG TERM GOAL #1   Title Pt will be independent with all HEP given    Baseline no knowledge    Time 8    Period Weeks    Status New    Target Date 09/18/20      PT LONG TERM GOAL #2   Title FOTO will improve from 21% intake   to 45%    indicating improved functional mobility .    Baseline eval 21%    Time 8    Period Weeks    Status New    Target Date 09/18/20      PT LONG TERM GOAL #3   Title Pt will improve her L/R knee flexion to  </= 125 degrees flexion and  2/10 pain for a more functional and efficient gait pattern    Baseline R and L  115 / 110 flexion    Time 8    Period Weeks    Status New    Target Date 09/18/20      PT LONG TERM GOAL #4   Title Pt will be able to  walk/stand >/= 30 minutes  with no AD with </= 2/10 pain for functional endurance and return to leisure activities post DC/ walking    Baseline cannot stand and walk greater than 15 min without 7/10 pain or more    Time 8    Period Weeks    Status New    Target Date 09/18/20      PT LONG TERM GOAL #5   Title Pt will be able to grocery shop and carry 20 lb of groceries and negotiate steps in order to demonstrate ability to live alone and home and care for own needs.    Baseline unable to carry groceries and avoids steps presently    Time 8    Period Weeks    Status New    Target Date 09/18/20      PT LONG TERM GOAL #6   Title Pt will be educated and demonstrate floor to mat x fer independently in order to demonstrate fall preparedness    Baseline unable to perform floor to mat x fer    Time 8    Period Weeks    Status New    Target Date 09/18/20                 Plan - 08/15/20 1334    Clinical Impression Statement Session was limited due to patient's reports of 10/10 pain at beginning of session that she reports worsened throughout session. Attempted 5 x STS though patient unable to complete due to pain, fatigue, and weakness having only completed 3 reps in 80 seconds, which is a regression from baseline. She is significantly  deconditioned only tolerating seated strengthening/mobility exercises today with rest breaks required in between sets. She is apprehensive to complete gentle ROM activity on the Lt knee and shoulder due to pain, though explained to patient that movement can help in overall pain reduction with patient able to complete partial range of prescribed ther ex on the Lt side today. Updated HEP to include seated exercises. Will continue to progress mobility and strength as tolerated, though discussed with patient that if her pain is limiting her tolerance to PT that she will likely need to return to referring provider.    Personal Factors and Comorbidities Behavior  Pattern;Comorbidity 2    Comorbidities Hx of dengue fever. Hx of depression, Legionnaire's dz, granuloma annulare(chronic skin dz) high anxiety, DM, Panic attacks FALLs    Examination-Activity Limitations Sit;Squat;Stairs;Stand;Lift;Dressing;Locomotion Level;Reach Overhead    Examination-Participation Restrictions Laundry    Stability/Clinical Decision Making Evolving/Moderate complexity    Rehab Potential Good    PT Frequency 2x / week    PT Duration 8 weeks    PT Treatment/Interventions ADLs/Self Care Home Management;Aquatic Therapy;Joint Manipulations;Spinal Manipulations;Dry needling;Vestibular;Passive range of motion;Taping;Manual techniques;Patient/family education;Neuromuscular re-education;Gait training;Stair training;Therapeutic activities;Therapeutic exercise;Ultrasound;Traction;Moist Heat;Iontophoresis 61m/ml Dexamethasone;Cryotherapy;Electrical Stimulation    PT Next Visit Plan Gait training/stair training. go over FOTO report. general UE/LE strengthening.    PT Home Exercise Plan [id number]  Consulted and Agree with Plan of Care Patient           Patient will benefit from skilled therapeutic intervention in order to improve the following deficits and impairments:  Difficulty walking,Decreased activity tolerance,Decreased balance,Decreased mobility,Decreased coordination,Decreased range of motion,Decreased strength,Hypomobility,Dizziness,Increased muscle spasms,Impaired flexibility,Postural dysfunction,Improper body mechanics,Pain,Obesity,Impaired UE functional use,Impaired perceived functional ability  Visit Diagnosis: Chronic pain of both knees  Muscle weakness (generalized)  Difficulty in walking, not elsewhere classified  Chronic left shoulder pain  Low back pain with sciatica, sciatica laterality unspecified, unspecified back pain laterality, unspecified chronicity  Cramp and spasm  Repeated falls     Problem List Patient Active Problem List   Diagnosis Date  Noted  . Major depressive disorder, recurrent episode with anxious distress ([facility]11/02/2020  . 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 History of dengue 11/03/2019  . History of Legionnaire's disease 11/03/2019   SaGwendolyn GrantPT, DPT, ATC 08/15/20 2:09 PM  [facility] [place]one: [phone] Fax:  [phone]: MaCYLA HALUSKARN: [id number] of Birth: 05/06/09/1967

## 2020-08-20 ENCOUNTER — Ambulatory Visit: Payer: Self-pay

## 2020-08-20 ENCOUNTER — Other Ambulatory Visit: Payer: Self-pay

## 2020-08-20 DIAGNOSIS — G8929 Other chronic pain: Secondary | ICD-10-CM

## 2020-08-20 DIAGNOSIS — R296 Repeated falls: Secondary | ICD-10-CM

## 2020-08-20 DIAGNOSIS — R252 Cramp and spasm: Secondary | ICD-10-CM

## 2020-08-20 DIAGNOSIS — R262 Difficulty in walking, not elsewhere classified: Secondary | ICD-10-CM

## 2020-08-20 DIAGNOSIS — M5440 Lumbago with sciatica, unspecified side: Secondary | ICD-10-CM

## 2020-08-20 DIAGNOSIS — M6281 Muscle weakness (generalized): Secondary | ICD-10-CM

## 2020-08-20 NOTE — Patient Instructions (Signed)
Aquatic Therapy at [facility]-  What to Expect!  Where:   [facility] [place], [place] [address] Rehab phone [phone]  NOTE:  You will receive an automated phone message reminding you of your appt and it will say the appointment is at the [facility].          How to Prepare: . Please make sure you drink 8 ounces of water about one hour prior to your pool session . A caregiver may attend if needed with the patient to help assist as needed. A caregiver can sit in the pool room on chair. Marland Kitchen Please arrive IN YOUR SUIT and 15 minutes prior to your appointment - this helps to avoid delays in starting your session. . Please make sure to attend to any toileting needs prior to entering the pool . [place] rooms for changing are provided.   There is direct access to the pool deck form the locker room.  You can lock your belongings in a locker with lock provided. . Once on the pool deck your therapist will ask if you have signed the Patient  Consent and Assignment of Benefits form before beginning treatment . Your therapist may take your blood pressure prior to, during and after your session if indicated . We usually try and create a home exercise program based on activities we do in the pool.  Please be thinking about who might be able to assist you in the pool should you need to participate in an aquatic home exercise program at the time of discharge if you need assistance.  Some patients do not want to or do not have the ability to participate in an aquatic home program - this is not a barrier in any way to you participating in aquatic therapy as part of your current therapy plan! . After Discharge from PT, you can continue using home program at  the [facility], there is a drop-in fee for $5 ($45 a month)or for 60 years  or older $4.00 ($40 a month for seniors ) or any local [employer] pool.  Memberships  for purchase are available for gym/pool at [facility]  It is very important that your last visit be in the clinic at [facility] street after your last aquatic visit.    Please make sure that you have a land/[address] on the calendar.   About the pool: 1. Pool is located 500 FT from the entrance of the building.  2. Entering the pool Your therapist will assist you; there are multiple ways to enter including stairs with railings, a walk in ramp, a roll in chair and a mechanical lift. Your therapist will determine the most appropriate way for you. 3. Water temperature is usually between 86-87 degrees 4. There may be other swimmers in the pool at the same time      Contact Info: For scheduling and cancellations        Appointments: Please call the [facility] if you need to cancel or reschedule an appointment at [facility]. Outpatient Rehabilitation @ [facility]         All sessions are 45 minutes [address] [place], [place] [address]

## 2020-08-20 NOTE — Therapy (Signed)
[place], [place], [address] Phone: [phone]   Fax:  [phone]  Physical Therapy Treatment  Patient Details  Name: Karen Whitehead MRN: [id number] Date of Birth: [birth year] Referring Provider (PT): Charlott Rakes MD   Encounter Date: 08/20/2020   PT End of Session - 08/20/20 1433    Visit Number 4    Number of Visits 16    Date for PT Re-Evaluation 09/04/20    Authorization Type CAFA  6th visit FOTO  10th visit FOTO    PT Start Time 5366    PT Stop Time 1458    PT Time Calculation (min) 41 min    Equipment Utilized During Treatment --   RW   Activity Tolerance Patient limited by pain;Patient limited by fatigue    Behavior During Therapy [facility] for tasks assessed/performed;Flat affect           Past Medical History:  Diagnosis Date  . Granuloma annulare     Past Surgical History:  Procedure Laterality Date  . APPENDECTOMY      There were no vitals filed for this visit.   Subjective Assessment - 08/20/20 1422    Subjective "My knee is horrid and my shoulder is bad too. I've been trying to do my exercises."    Currently in Pain? Yes    Pain Score 10-Worst pain ever    Pain Location Knee    Pain Orientation Left    Pain Descriptors / Indicators Tightness    Pain Type Chronic pain    Pain Onset More than a month ago    Pain Frequency Constant    Multiple Pain Sites Yes    Pain Score 8    Pain Location Shoulder    Pain Orientation Left    Pain Descriptors / Indicators Tightness    Pain Type Chronic pain    Pain Onset More than a month ago    Pain Frequency Constant                             [facility] Adult PT Treatment/Exercise - 08/20/20 0001      Self-Care   Self-Care Other Self-Care Comments    Other Self-Care Comments  see patient education      Lumbar Exercises: Aerobic   Nustep 5 minute level 3 LE only      Knee/Hip Exercises: Stretches   Other Knee/Hip  Stretches hip flexor stretch 60 sec      Knee/Hip Exercises: Supine   Quad Sets 10 reps    Quad Sets Limitations LLE    Short Arc [employer] 10 reps    Short Arc Quad Sets Limitations bilateral    Other Supine Knee/Hip Exercises supine march 20 reps      Shoulder Exercises: Pulleys   Flexion 2 minutes                  PT Education - 08/20/20 1434    Education Details Education on aquatic PT.    Person(s) Educated Patient    Methods Explanation;Handout    Comprehension Verbalized understanding            PT Short Term Goals - 08/15/20 1321      PT SHORT TERM GOAL #1   Title Pt will be independent with intial HEP    Baseline --    Time 4    Period Weeks    Status Achieved  Target Date 08/21/20      PT SHORT TERM GOAL #2   Title Pt will reduced 5 x STS to at least 35 sec to show improved LE strength    Baseline eval 53.21 sec; unable 08/15/20    Time 4    Period Weeks    Status Not Met    Target Date 08/21/20      PT SHORT TERM GOAL #3   Title Pt will be able to ascend/descend stairs  without pain 4/10 or less    Baseline is using RW for stairs at home, reports a lot of difficulty.    Time 4    Period Weeks    Status On-going    Target Date 08/21/20             PT Long Term Goals - 07/24/20 1400      PT LONG TERM GOAL #1   Title Pt will be independent with all HEP given    Baseline no knowledge    Time 8    Period Weeks    Status New    Target Date 09/18/20      PT LONG TERM GOAL #2   Title FOTO will improve from 21% intake   to 45%    indicating improved functional mobility .    Baseline eval 21%    Time 8    Period Weeks    Status New    Target Date 09/18/20      PT LONG TERM GOAL #3   Title Pt will improve her L/R knee flexion to  </= 125 degrees flexion and  2/10 pain for a more functional and efficient gait pattern    Baseline R and L  115 / 110 flexion    Time 8    Period Weeks    Status New    Target Date 09/18/20      PT  LONG TERM GOAL #4   Title Pt will be able to walk/stand >/= 30 minutes  with no AD with </= 2/10 pain for functional endurance and return to leisure activities post DC/ walking    Baseline cannot stand and walk greater than 15 min without 7/10 pain or more    Time 8    Period Weeks    Status New    Target Date 09/18/20      PT LONG TERM GOAL #5   Title Pt will be able to grocery shop and carry 20 lb of groceries and negotiate steps in order to demonstrate ability to live alone and home and care for own needs.    Baseline unable to carry groceries and avoids steps presently    Time 8    Period Weeks    Status New    Target Date 09/18/20      PT LONG TERM GOAL #6   Title Pt will be educated and demonstrate floor to mat x fer independently in order to demonstrate fall preparedness    Baseline unable to perform floor to mat x fer    Time 8    Period Weeks    Status New    Target Date 09/18/20                 Plan - 08/20/20 1444    Clinical Impression Statement Patient continues to report high pain levels upon arrival and only tolerates low level strengthening and mobility exercises at this time. She would likely benefit from aquatic therapy as movement is  painful in every position on land. She was provided with information about aquatics and recommended to schedule aquatic visits at the end of today's session. Focused on addressing her knee pain today with patient having difficulty activating quadricep with SAQ and quad sets on the LLE.    Personal Factors and Comorbidities Behavior Pattern;Comorbidity 2    Comorbidities Hx of dengue fever. Hx of depression, Legionnaire's dz, granuloma annulare(chronic skin dz) high anxiety, DM, Panic attacks FALLs    Examination-Activity Limitations Sit;Squat;Stairs;Stand;Lift;Dressing;Locomotion Level;Reach Overhead    Examination-Participation Restrictions Laundry    Stability/Clinical Decision Making Evolving/Moderate complexity    Rehab  Potential Good    PT Frequency 2x / week    PT Duration 8 weeks    PT Treatment/Interventions ADLs/Self Care Home Management;Aquatic Therapy;Joint Manipulations;Spinal Manipulations;Dry needling;Vestibular;Passive range of motion;Taping;Manual techniques;Patient/family education;Neuromuscular re-education;Gait training;Stair training;Therapeutic activities;Therapeutic exercise;Ultrasound;Traction;Moist Heat;Iontophoresis 30m/ml Dexamethasone;Cryotherapy;Electrical Stimulation    PT Next Visit Plan Gait training/stair training. go over FOTO report. general UE/LE strengthening.    PT Home Exercise Plan [id number]  Consulted and Agree with Plan of Care Patient           Patient will benefit from skilled therapeutic intervention in order to improve the following deficits and impairments:  Difficulty walking,Decreased activity tolerance,Decreased balance,Decreased mobility,Decreased coordination,Decreased range of motion,Decreased strength,Hypomobility,Dizziness,Increased muscle spasms,Impaired flexibility,Postural dysfunction,Improper body mechanics,Pain,Obesity,Impaired UE functional use,Impaired perceived functional ability  Visit Diagnosis: Chronic pain of both knees  Muscle weakness (generalized)  Difficulty in walking, not elsewhere classified  Chronic left shoulder pain  Low back pain with sciatica, sciatica laterality unspecified, unspecified back pain laterality, unspecified chronicity  Cramp and spasm  Repeated falls     Problem List Patient Active Problem List   Diagnosis Date Noted  . Major depressive disorder, recurrent episode with anxious distress (HC[place]11/02/2020  . 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 History of dengue 11/03/2019  . History of Legionnaire's disease 11/03/2019   SaGwendolyn GrantPT, DPT, ATC 08/20/20 3:48 PM  [facility]hone: [phone] Fax:   [phone]: Karen Whitehead: [id number] of Birth: [birth year]

## 2020-08-22 ENCOUNTER — Telehealth: Payer: Self-pay | Admitting: Family Medicine

## 2020-08-22 ENCOUNTER — Other Ambulatory Visit: Payer: Self-pay

## 2020-08-22 ENCOUNTER — Ambulatory Visit: Payer: Self-pay

## 2020-08-22 DIAGNOSIS — R296 Repeated falls: Secondary | ICD-10-CM

## 2020-08-22 DIAGNOSIS — M25562 Pain in left knee: Secondary | ICD-10-CM

## 2020-08-22 DIAGNOSIS — M5440 Lumbago with sciatica, unspecified side: Secondary | ICD-10-CM

## 2020-08-22 DIAGNOSIS — M25512 Pain in left shoulder: Secondary | ICD-10-CM

## 2020-08-22 DIAGNOSIS — R262 Difficulty in walking, not elsewhere classified: Secondary | ICD-10-CM

## 2020-08-22 DIAGNOSIS — M6281 Muscle weakness (generalized): Secondary | ICD-10-CM

## 2020-08-22 DIAGNOSIS — G8929 Other chronic pain: Secondary | ICD-10-CM

## 2020-08-22 DIAGNOSIS — R252 Cramp and spasm: Secondary | ICD-10-CM

## 2020-08-22 NOTE — Therapy (Addendum)
[place], [place], [address] Phone: [phone]   Fax:  [phone]  Physical Therapy Treatment/Discharge   Patient Details  Name: Karen Whitehead MRN: [id number] Date of Birth: 1965-10-22 Referring Provider (PT): Charlott Rakes MD   Encounter Date: 08/22/2020   PT End of Session - 08/22/20 1354     Visit Number 5    Number of Visits 16    Date for PT Re-Evaluation 09/04/20    Authorization Type CAFA  6th visit FOTO  10th visit FOTO    PT Start Time 4163    PT Stop Time 1340    PT Time Calculation (min) 23 min    Equipment Utilized During Treatment --   RW   Activity Tolerance Patient limited by pain;Patient limited by fatigue    Behavior During Therapy [facility] for tasks assessed/performed;Flat affect             Past Medical History:  Diagnosis Date   Granuloma annulare     Past Surgical History:  Procedure Laterality Date   APPENDECTOMY      There were no vitals filed for this visit.   Subjective Assessment - 08/22/20 1322     Subjective "I feel horrible. Today I have it in my wrist, my elbows, my joints, my knees, my legs, my back. Yesterday it felt like my hips were locking up on me. The right hip was so bad. I couldn't even make it down the hall. I can't figure out why the pain is moving. I get twitches in my face and muscle spasms in my legs and arms they just tighten up and I don't understand why. My muscles just lock up and it's like my body won't do what it's supposed to do. They keep telling me that the dengue fever has nothing to do with it. I have been over a week without a fall.    Currently in Pain? Yes    Pain Score 10-Worst pain ever    Pain Location Generalized    Pain Descriptors / Indicators Aching    Pain Type Chronic pain    Pain Onset More than a month ago    Pain Frequency Constant    Pain Onset More than a month ago                [facility] PT Assessment - 08/22/20  0001       Sensation   Light Touch Appears Intact      Deep Tendon Reflexes   DTR Assessment Site Patella;Achilles    Patella DTR 0    Achilles DTR 0      Strength   Overall Strength Comments Lt hip flexion 3/5, knee flexion 3-/5, ankle dorsiflexion 3/5, ankle plantarflexion (seated) 3+/5, eversion 3-/5      Special Tests   Other special tests (-) Clonus      Ambulation/Gait   Gait Comments decreased hip and knee flexion, foot flat initial contact, limited push off LLE (appears to be dragging the LLE when ambulating with RW)                                   PT Education - 08/22/20 1352     Education Details education on assessment findings and plan to place PT on hold and to f/u with referring provider due to severe ongoing pain and assessment findings. encouraged to continue walking  and HEP as tolerated.    Person(s) Educated Patient    Methods Explanation    Comprehension Verbalized understanding              PT Short Term Goals - 08/15/20 1321       PT SHORT TERM GOAL #1   Title Pt will be independent with intial HEP    Baseline --    Time 4    Period Weeks    Status Achieved    Target Date 08/21/20      PT SHORT TERM GOAL #2   Title Pt will reduced 5 x STS to at least 35 sec to show improved LE strength    Baseline eval 53.21 sec; unable 08/15/20    Time 4    Period Weeks    Status Not Met    Target Date 08/21/20      PT SHORT TERM GOAL #3   Title Pt will be able to ascend/descend stairs  without pain 4/10 or less    Baseline is using RW for stairs at home, reports a lot of difficulty.    Time 4    Period Weeks    Status On-going    Target Date 08/21/20               PT Long Term Goals - 07/24/20 1400       PT LONG TERM GOAL #1   Title Pt will be independent with all HEP given    Baseline no knowledge    Time 8    Period Weeks    Status New    Target Date 09/18/20      PT LONG TERM GOAL #2   Title FOTO will  improve from 21% intake   to 45%    indicating improved functional mobility .    Baseline eval 21%    Time 8    Period Weeks    Status New    Target Date 09/18/20      PT LONG TERM GOAL #3   Title Pt will improve her L/R knee flexion to  </= 125 degrees flexion and  2/10 pain for a more functional and efficient gait pattern    Baseline R and L  115 / 110 flexion    Time 8    Period Weeks    Status New    Target Date 09/18/20      PT LONG TERM GOAL #4   Title Pt will be able to walk/stand >/= 30 minutes  with no AD with </= 2/10 pain for functional endurance and return to leisure activities post DC/ walking    Baseline cannot stand and walk greater than 15 min without 7/10 pain or more    Time 8    Period Weeks    Status New    Target Date 09/18/20      PT LONG TERM GOAL #5   Title Pt will be able to grocery shop and carry 20 lb of groceries and negotiate steps in order to demonstrate ability to live alone and home and care for own needs.    Baseline unable to carry groceries and avoids steps presently    Time 8    Period Weeks    Status New    Target Date 09/18/20      PT LONG TERM GOAL #6   Title Pt will be educated and demonstrate floor to mat x fer independently in order to demonstrate fall preparedness  Baseline unable to perform floor to mat x fer    Time 8    Period Weeks    Status New    Target Date 09/18/20                   Plan - 08/22/20 1354     Clinical Impression Statement Patient arrives for 5th PT visits with continued reports of high levels of pain in multiple joints. Her gait appears more antalgic as she has limited hip/knee flexion on the LLE, foot flat initial contact, and decreased push off that patient reports is related to her knee pain and leg weakness. Completed brief neuro screen today with significant weakness about the entire LLE (not in a myotomal pattern), normal sensation, no clonus, and absent patella and achilles DTR bilaterally.  Given these findings as well as her continued reports of 10/10 pain since the start of care and pain fluctuating between multiple joints it is recommended that PT be placed on hold pending f/u with referring provider for further evaluation.    Personal Factors and Comorbidities Behavior Pattern;Comorbidity 2    Comorbidities Hx of dengue fever. Hx of depression, Legionnaire's dz, granuloma annulare(chronic skin dz) high anxiety, DM, Panic attacks FALLs    Examination-Activity Limitations Sit;Squat;Stairs;Stand;Lift;Dressing;Locomotion Level;Reach Overhead    Examination-Participation Restrictions Laundry    Stability/Clinical Decision Making Evolving/Moderate complexity    Rehab Potential Good    PT Frequency 2x / week    PT Duration 8 weeks    PT Treatment/Interventions ADLs/Self Care Home Management;Aquatic Therapy;Joint Manipulations;Spinal Manipulations;Dry needling;Vestibular;Passive range of motion;Taping;Manual techniques;Patient/family education;Neuromuscular re-education;Gait training;Stair training;Therapeutic activities;Therapeutic exercise;Ultrasound;Traction;Moist Heat;Iontophoresis 77m/ml Dexamethasone;Cryotherapy;Electrical Stimulation    PT Next Visit Plan PT on hold    PT Home Exercise Plan [id number]  Recommended Other Services f/u with PCP    Consulted and Agree with Plan of Care Patient             Patient will benefit from skilled therapeutic intervention in order to improve the following deficits and impairments:  Difficulty walking,Decreased activity tolerance,Decreased balance,Decreased mobility,Decreased coordination,Decreased range of motion,Decreased strength,Hypomobility,Dizziness,Increased muscle spasms,Impaired flexibility,Postural dysfunction,Improper body mechanics,Pain,Obesity,Impaired UE functional use,Impaired perceived functional ability  Visit Diagnosis: Chronic pain of both knees  Muscle weakness (generalized)  Difficulty in walking, not elsewhere  classified  Chronic left shoulder pain  Low back pain with sciatica, sciatica laterality unspecified, unspecified back pain laterality, unspecified chronicity  Cramp and spasm  Repeated falls     Problem List Patient Active Problem List   Diagnosis Date Noted   Major depressive disorder, recurrent episode with anxious distress (HC[place]11/02/2020   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  Patient goals were partially met Patient is being discharged due to not returning since last visit.     SaGwendolyn GrantPT, DPT, ATC 10/11/20 11:02 AM  [facility] Hospital[address]hone: [phone] Fax:  [phone]: MaALISHIA LEBORN: [id number] of Birth: 1/Apr 15, 1966

## 2020-08-22 NOTE — Telephone Encounter (Signed)
Copied from [place] [id number]. Topic: General - Other >> Aug 22, 2020  2:03 PM Yvette Rack wrote: Reason for CRM: Pt stated her physical therapy appt was cancelled. Pt stated the physical therapist named Aldona Bar is suppose to be sending something in to Dr. Margarita Rana. Pt stated she would like to know what is the next step and requests call back. Cb# [phone]

## 2020-08-24 NOTE — Telephone Encounter (Signed)
No paperwork has been received pt will be called once paperwork is received.

## 2020-08-27 ENCOUNTER — Encounter: Payer: Self-pay | Admitting: Physical Therapy

## 2020-08-29 ENCOUNTER — Ambulatory Visit: Payer: Self-pay

## 2020-08-30 ENCOUNTER — Other Ambulatory Visit (HOSPITAL_COMMUNITY): Payer: Self-pay | Admitting: Psychiatry

## 2020-08-30 DIAGNOSIS — F339 Major depressive disorder, recurrent, unspecified: Secondary | ICD-10-CM

## 2020-08-31 ENCOUNTER — Other Ambulatory Visit: Payer: Self-pay

## 2020-08-31 MED FILL — Glipizide Tab 5 MG: ORAL | 30 days supply | Qty: 30 | Fill #0 | Status: AC

## 2020-08-31 MED FILL — Trazodone HCl Tab 100 MG: ORAL | 30 days supply | Qty: 60 | Fill #0 | Status: AC

## 2020-08-31 MED FILL — Quetiapine Fumarate Tab 100 MG: ORAL | 30 days supply | Qty: 30 | Fill #0 | Status: AC

## 2020-08-31 MED FILL — Atorvastatin Calcium Tab 20 MG (Base Equivalent): ORAL | 30 days supply | Qty: 30 | Fill #0 | Status: AC

## 2020-08-31 MED FILL — Dapagliflozin Propanediol Tab 5 MG (Base Equivalent): ORAL | 30 days supply | Qty: 30 | Fill #0 | Status: AC

## 2020-09-05 ENCOUNTER — Other Ambulatory Visit: Payer: Self-pay

## 2020-09-05 ENCOUNTER — Ambulatory Visit (INDEPENDENT_AMBULATORY_CARE_PROVIDER_SITE_OTHER): Payer: No Payment, Other | Admitting: Licensed Clinical Social Worker

## 2020-09-05 DIAGNOSIS — F418 Other specified anxiety disorders: Secondary | ICD-10-CM | POA: Diagnosis not present

## 2020-09-05 NOTE — Progress Notes (Signed)
   THERAPIST PROGRESS NOTE   Virtual Visit via Video Note  I connected with Karen Whitehead on 09/05/20 at 10:00 AM EDT by a video enabled telemedicine application and verified that I am speaking with the correct person using two identifiers.  Location: Patient: Home Provider: [facility]   I discussed the limitations of evaluation and management by telemedicine and the availability of in person appointments. The patient expressed understanding and agreed to proceed. I discussed the assessment and treatment plan with the patient. The patient was provided an opportunity to ask questions and all were answered. The patient agreed with the plan and demonstrated an understanding of the instructions.   I provided 35 minutes of non-face-to-face time during this encounter.  Participation Level: Active  Behavioral Response: CasualAlertAnxious and Depressed  Type of Therapy: Individual Therapy  Treatment Goals addressed: Communication: dep/anx/coping  Interventions: CBT and Supportive  Summary: Karen Whitehead is a 55 y.o. female who presents with hx of dep/anx. This date pt signs on for video session. In short amount of time audio is lost. LCSW called pt and session continued by phone. Pt states she is "Hanging in there". She states her PT was stopped as the therapist was concerned what else may be going on with pt since she has no reflex response. Pt advises PT sent message to her PCP to ask for CT of head/xrays. Pt states she has not heard anything and does not have f/u with PCP until Aug. Pt encouraged to call for options and to see if tests can be done while waiting for appt. Pt states intent to f/u. Pt taking meds as prescribed and feels Seroquel is working "pretty good". Reminded pt of med appt 5/6. Pt continues to struggle with anx and intermittent panic as well as symptoms of depression. Pt states she has not been out of the house in over a wk. She was able to go to PT appts 4-5 times before it was  stopped. Pt continues to struggle with physical pain and reports appt with pain management on 5/19. Pt's debility/loss of health and independence is her major stressor. Son continues to work so financial stress somewhat improved. Assessed for coping skills/strategies. Pt advises she is working on self talk, has opened the curtains for natural light as suggested, she is using prayer and music. Pt states she has eliminated all caffeine. LCSW provided encouragement and commended pt for her efforts, follow through with poc. LCSW reviewed poc including scheduling. Pt states appreciation for care.  Suicidal/Homicidal: Nowithout intent/plan  Therapist Response: Pt remains receptive to care.  Plan: Return again in 3 weeks.  Diagnosis: Axis I: Depression with anxiety   Hermine Messick, LCSW 09/05/2020

## 2020-09-06 ENCOUNTER — Other Ambulatory Visit: Payer: Self-pay

## 2020-09-07 ENCOUNTER — Telehealth (INDEPENDENT_AMBULATORY_CARE_PROVIDER_SITE_OTHER): Payer: No Payment, Other | Admitting: Psychiatry

## 2020-09-07 ENCOUNTER — Encounter (HOSPITAL_COMMUNITY): Payer: Self-pay | Admitting: Psychiatry

## 2020-09-07 ENCOUNTER — Other Ambulatory Visit: Payer: Self-pay

## 2020-09-07 DIAGNOSIS — F339 Major depressive disorder, recurrent, unspecified: Secondary | ICD-10-CM

## 2020-09-07 MED ORDER — TRAZODONE HCL 100 MG PO TABS
ORAL_TABLET | Freq: Every day | ORAL | 1 refills | Status: DC
  Filled 2020-09-07: qty 60, fill #0

## 2020-09-07 MED ORDER — SERTRALINE HCL 100 MG PO TABS
ORAL_TABLET | ORAL | 1 refills | Status: DC
  Filled 2020-09-07 (×2): qty 60, 30d supply, fill #0

## 2020-09-07 MED ORDER — QUETIAPINE FUMARATE 200 MG PO TABS
200.0000 mg | ORAL_TABLET | Freq: Every day | ORAL | 1 refills | Status: DC
  Filled 2020-09-07 – 2020-10-15 (×2): qty 30, 30d supply, fill #0
  Filled 2020-11-13: qty 30, 30d supply, fill #1

## 2020-09-07 MED ORDER — CLONAZEPAM 1 MG PO TABS: 1.0000 mg | ORAL_TABLET | Freq: Two times a day (BID) | ORAL | 1 refills | Status: DC | PRN

## 2020-09-07 NOTE — Progress Notes (Signed)
[facility] Progress Note   Virtual Visit via Telephone Note  I connected with Karen Whitehead on 09/07/20 at 11:00 AM EDT by telephone and verified that I am speaking with the correct person using two identifiers.  Location: Patient: home Provider: Clinic   I discussed the limitations, risks, security and privacy concerns of performing an evaluation and management service by telephone and the availability of in person appointments. I also discussed with the patient that there may be a patient responsible charge related to this service. The patient expressed understanding and agreed to proceed.   I provided 18 minutes of non-face-to-face time during this encounter.     Patient Identification: Karen Whitehead MRN:  [id number] Date of Evaluation:  09/07/2020    Chief Complaint:   " I still feel down."  Visit Diagnosis:    ICD-10-CM   1. Major depressive disorder, recurrent episode with anxious distress (HCC)  F33.9 QUEtiapine (SEROQUEL) 200 MG tablet    traZODone (DESYREL) 100 MG tablet    sertraline (ZOLOFT) 100 MG tablet    clonazePAM (KLONOPIN) 1 MG tablet    History of Present Illness: Patient reported she continues to feel down and depressed.  She still does not have the energy or motivation to do anything. She reported that she has been able to sleep better with addition of Seroquel at bedtime.  However she still has some days when she does not get full 6 or 7 hours of sleep. She is not having frequent crying spells anymore.  She informed that she has had 2 or 3 panic attacks since we last spoke. She stated that she has chronic pain issues and recently her physical therapy clinic discharged her and recommended that she sees another provider for higher level of care. She stated that she just wants to get better so that she can have a better quality of life and do the things that she used to do.  Writer asked her if there was any specific trigger or stressor that was bothering her to  which she replied not really. She denied having any suicidal ideations or intent however stated that sometimes she does not want to just carry on her life anymore.  She denied any hallucinations or delusions.  Writer asked her if she would like to try higher dose of Seroquel as that can help with her mood as well as anxiety.  Patient was willing to try that.    Past Psychiatric History: MDD, anxiety  Previous Psychotropic Medications: Yes   Substance Abuse History in the last 12 months:  No.  Consequences of Substance Abuse: NA  Past Medical History:  Past Medical History:  Diagnosis Date  . Granuloma annulare     Past Surgical History:  Procedure Laterality Date  . APPENDECTOMY      Family Psychiatric History: denied  Family History: History reviewed. No pertinent family history.  Social History:   Social History   Socioeconomic History  . Marital status: Single    Spouse name: Not on file  . Number of children: Not on file  . Years of education: Not on file  . Highest education level: Not on file  Occupational History  . Not on file  Tobacco Use  . Smoking status: Current Every Day Smoker    Packs/day: 0.50    Types: Cigarettes  . Smokeless tobacco: Never Used  Substance and Sexual Activity  . Alcohol use: No  . Drug use: No  . Sexual activity: Not on  file  Other Topics Concern  . Not on file  Social History Narrative  . Not on file   Social Determinants of Health   Financial Resource Strain: Not on file  Food Insecurity: Not on file  Transportation Needs: Not on file  Physical Activity: Not on file  Stress: Not on file  Social Connections: Not on file    Additional Social History: Lives with her 48 y/o son  Allergies:  No Known Allergies  Metabolic Disorder Labs: Lab Results  Component Value Date   HGBA1C 5.6 07/31/2020   No results found for: PROLACTIN No results found for: CHOL, TRIG, HDL, CHOLHDL, VLDL, LDLCALC No results found for:  TSH  Therapeutic Level Labs: No results found for: LITHIUM No results found for: CBMZ No results found for: VALPROATE  Current Medications: Current Outpatient Medications  Medication Sig Dispense Refill  . QUEtiapine (SEROQUEL) 200 MG tablet Take 1 tablet (200 mg total) by mouth at bedtime. 30 tablet 1  . albuterol (VENTOLIN HFA) 108 (90 Base) MCG/ACT inhaler INHALE 1-2 PUFFS INTO THE LUNGS EVERY 6 (SIX) HOURS AS NEEDED FOR WHEEZING OR SHORTNESS OF BREATH. 18 g 1  . albuterol (VENTOLIN HFA) 108 (90 Base) MCG/ACT inhaler INHALE 1-2 PUFFS INTO THE LUNGS EVERY 6 (SIX) HOURS AS NEEDED FOR WHEEZING OR SHORTNESS OF BREATH. 18 g 1  . atorvastatin (LIPITOR) 20 MG tablet Take 1 tablet (20 mg total) by mouth daily. 30 tablet 3  . atorvastatin (LIPITOR) 20 MG tablet TAKE 1 TABLET (20 MG TOTAL) BY MOUTH DAILY. 30 tablet 3  . atorvastatin (LIPITOR) 20 MG tablet TAKE 1 TABLET (20 MG TOTAL) BY MOUTH DAILY. 30 tablet 3  . atorvastatin (LIPITOR) 20 MG tablet TAKE 1 TABLET (20 MG TOTAL) BY MOUTH DAILY. 30 tablet 3  . Blood Glucose Monitoring Suppl (TRUE METRIX METER) w/Device KIT 1 each by Does not apply route in the morning and at bedtime. 1 kit 0  . busPIRone (BUSPAR) 7.5 MG tablet TAKE 1 TABLET (7.5 MG TOTAL) BY MOUTH 2 (TWO) TIMES DAILY. 60 tablet 3  . clonazePAM (KLONOPIN) 1 MG tablet Take 1 tablet (1 mg total) by mouth 2 (two) times daily as needed. for anxiety 60 tablet 1  . cyclobenzaprine (FLEXERIL) 10 MG tablet Take 1 tablet (10 mg total) by mouth at bedtime. Prn muscle pain 30 tablet 3  . cyclobenzaprine (FLEXERIL) 10 MG tablet TAKE 1 TABLET (10 MG TOTAL) BY MOUTH AT BEDTIME. AS NEEDED FOR MUSCLE PAIN 30 tablet 3  . cyclobenzaprine (FLEXERIL) 10 MG tablet TAKE 1 TABLET (10 MG TOTAL) BY MOUTH AT BEDTIME. AS NEEDED MUSCLE PAIN 30 tablet 3  . cyclobenzaprine (FLEXERIL) 10 MG tablet TAKE 1 TABLET (10 MG TOTAL) BY MOUTH AT BEDTIME AS NEEDED FOR MUSCLE PAIN 30 tablet 1  . dapagliflozin propanediol  (FARXIGA) 5 MG TABS tablet Take 1 tablet (5 mg total) by mouth daily before breakfast. 30 tablet 3  . dapagliflozin propanediol (FARXIGA) 5 MG TABS tablet TAKE 1 TABLET (5 MG TOTAL) BY MOUTH DAILY BEFORE BREAKFAST. 30 tablet 3  . dapagliflozin propanediol (FARXIGA) 5 MG TABS tablet TAKE 1 TABLET (5 MG TOTAL) BY MOUTH DAILY BEFORE BREAKFAST. 30 tablet 3  . fluticasone (CUTIVATE) 0.005 % ointment Apply 1 application topically 2 (two) times daily.    Marland Kitchen gabapentin (NEURONTIN) 300 MG capsule Take 1 capsule (300 mg total) by mouth at bedtime. 30 capsule 3  . gabapentin (NEURONTIN) 300 MG capsule TAKE 1 CAPSULE (300 MG TOTAL) BY MOUTH  AT BEDTIME. 30 capsule 3  . glipiZIDE (GLUCOTROL) 5 MG tablet Take 0.5 tablets (2.5 mg total) by mouth 2 (two) times daily before a meal. 30 tablet 3  . glipiZIDE (GLUCOTROL) 5 MG tablet TAKE 0.5 TABLETS (2.5 MG TOTAL) BY MOUTH 2 (TWO) TIMES DAILY BEFORE A MEAL. 30 tablet 3  . glipiZIDE (GLUCOTROL) 5 MG tablet TAKE 1/2 TABLET (2.5 MG TOTAL) BY MOUTH 2 (TWO) TIMES DAILY BEFORE A MEAL. 30 tablet 3  . glucose blood (TRUE METRIX BLOOD GLUCOSE TEST) test strip Use as instructed 100 each 12  . glucose blood test strip USE AS INSTRUCTED 100 strip 12  . meloxicam (MOBIC) 7.5 MG tablet Take 1 tablet (7.5 mg total) by mouth daily. 30 tablet 3  . meloxicam (MOBIC) 7.5 MG tablet TAKE 1 TABLET (7.5 MG TOTAL) BY MOUTH DAILY. 30 tablet 3  . meloxicam (MOBIC) 7.5 MG tablet TAKE 1 TABLET (7.5 MG TOTAL) BY MOUTH DAILY. 30 tablet 3  . meloxicam (MOBIC) 7.5 MG tablet TAKE 1 TABLET (7.5 MG TOTAL) BY MOUTH DAILY. 30 tablet 0  . sertraline (ZOLOFT) 100 MG tablet TAKE 2 TABLETS BY MOUTH ONCE A DAY 60 tablet 1  . sertraline (ZOLOFT) 100 MG tablet TAKE 1 TABLET (100 MG TOTAL) BY MOUTH DAILY. 30 tablet 3  . sertraline (ZOLOFT) 100 MG tablet TAKE 1 TABLET (100 MG TOTAL) BY MOUTH DAILY. 30 tablet 3  . sertraline (ZOLOFT) 100 MG tablet Take 2 tablets daily (200 mg) 60 tablet 1  . traZODone (DESYREL) 100  MG tablet TAKE 2 TABLETS BY MOUTH AT BEDTIME 60 tablet 1  . traZODone (DESYREL) 100 MG tablet TAKE 2 TABLETS BY MOUTH AT BEDTIME 60 tablet 1  . TRUEplus Lancets 28G MISC 1 each by Does not apply route in the morning and at bedtime. 100 each 1   No current facility-administered medications for this visit.    Psychiatric Specialty Exam: Review of Systems  There were no vitals taken for this visit.There is no height or weight on file to calculate BMI.  General Appearance: unable to assess due to phone visit  Eye Contact:  unable to assess due to phone visit  Speech:  Clear and Coherent and Normal Rate  Volume:  Normal  Mood:  Anxious, Depressed and Dysphoric  Affect:  Congruent  Thought Process:  Goal Directed and Descriptions of Associations: Intact  Orientation:  Full (Time, Place, and Person)  Thought Content:  Logical  Suicidal Thoughts:  No  Homicidal Thoughts:  No  Memory:  Immediate;   Good Recent;   Good  Judgement:  Fair  Insight:  Fair  Psychomotor Activity:  Normal  Concentration:  Concentration: Good and Attention Span: Good  Recall:  Good  Fund of Knowledge:Good  Language: Good  Akathisia:  Negative  Handed:  Right  AIMS (if indicated):  0  Assets:  Communication Skills Desire for Improvement Financial Resources/Insurance Housing  ADL's:  Intact  Cognition: WNL  Sleep:  Fair, improved after Seroquel was added.   Screenings: GAD-7   Flowsheet Row Office Visit from 02/06/2020 in [facility] from 11/16/2019 in [facility] Office Visit from 11/03/2019 in [facility]  Total GAD-7 Score $RemoveBef'17 16 18    'pavDJsiWWJ$ PHQ2-9   [facility] Office Visit from 02/06/2020 in [place] [facility] from 11/16/2019 in [facility] Office Visit from 11/03/2019 in  [facility]   PHQ-2 Total Score $RemoveBef'6 6 6  'bnkcoGUbfz$ PHQ-9 Total Score $RemoveBef'24 22 21    'nmikbyJpPZ$ [place] from 11/24/2019 in [facility] from 11/16/2019 in [place] [facility] [place] Low Risk Low Risk      Assessment and Plan: Patient continues to endorse depression symptoms.  She reported that she had couple panic attacks since the last visit.  She has been sleeping better with help of Seroquel but still feels depressed and anxious at times.  She is agreeable to trying higher dose of Seroquel to see if that helps.   1. Major depressive disorder, recurrent episode with anxious distress (HCC)  -Increase QUEtiapine (SEROQUEL) 200 MG tablet; Take 1 tablet (200 mg total) by mouth at bedtime.  Dispense: 30 tablet; Refill: 1 - traZODone (DESYREL) 100 MG tablet; TAKE 2 TABLETS BY MOUTH AT BEDTIME  Dispense: 60 tablet; Refill: 1 - sertraline (ZOLOFT) 100 MG tablet; Take 2 tablets daily (200 mg)  Dispense: 60 tablet; Refill: 1 - clonazePAM (KLONOPIN) 1 MG tablet; Take 1 tablet (1 mg total) by mouth 2 (two) times daily as needed. for anxiety  Dispense: 60 tablet; Refill: 1   Continue therapy with Ms. Hatch. F/up in 6 weeks.    [place] Crane, MD 5/6/202211:37 AM

## 2020-09-14 ENCOUNTER — Telehealth: Payer: Self-pay | Admitting: Family Medicine

## 2020-09-14 ENCOUNTER — Other Ambulatory Visit: Payer: Self-pay

## 2020-09-14 NOTE — Telephone Encounter (Signed)
Pt called to speak with Dr. Margarita Rana about being taken out of PT due to needing test procedures and they advised her that she has nerve damage or something neurological and her joints dont move like they are suppose to / pt would like a call asap

## 2020-09-14 NOTE — Telephone Encounter (Signed)
Please follow up

## 2020-09-17 NOTE — Telephone Encounter (Signed)
Call placed to patient and call can not be completed at this time.

## 2020-09-18 ENCOUNTER — Telehealth: Payer: Self-pay

## 2020-09-18 NOTE — Telephone Encounter (Signed)
Unable to leave voicemail notifying patient of cancelling scheduled PT appointments as PT was placed on hold until patient has f/u with provider. Contacted patient via MyChart instead.

## 2020-09-19 ENCOUNTER — Ambulatory Visit: Payer: Self-pay | Admitting: Physical Therapy

## 2020-09-20 ENCOUNTER — Other Ambulatory Visit: Payer: Self-pay

## 2020-09-20 ENCOUNTER — Encounter: Payer: Self-pay | Attending: Physical Medicine and Rehabilitation | Admitting: Physical Medicine and Rehabilitation

## 2020-09-20 ENCOUNTER — Encounter: Payer: Self-pay | Admitting: Physical Medicine and Rehabilitation

## 2020-09-20 VITALS — BP 131/86 | HR 82 | Temp 99.0°F | Ht 64.0 in | Wt 218.2 lb

## 2020-09-20 DIAGNOSIS — M546 Pain in thoracic spine: Secondary | ICD-10-CM | POA: Insufficient documentation

## 2020-09-20 DIAGNOSIS — F339 Major depressive disorder, recurrent, unspecified: Secondary | ICD-10-CM | POA: Insufficient documentation

## 2020-09-20 DIAGNOSIS — R413 Other amnesia: Secondary | ICD-10-CM | POA: Insufficient documentation

## 2020-09-20 DIAGNOSIS — M542 Cervicalgia: Secondary | ICD-10-CM | POA: Insufficient documentation

## 2020-09-20 DIAGNOSIS — R531 Weakness: Secondary | ICD-10-CM | POA: Insufficient documentation

## 2020-09-20 DIAGNOSIS — M545 Low back pain, unspecified: Secondary | ICD-10-CM | POA: Insufficient documentation

## 2020-09-20 DIAGNOSIS — L920 Granuloma annulare: Secondary | ICD-10-CM | POA: Insufficient documentation

## 2020-09-20 DIAGNOSIS — M25562 Pain in left knee: Secondary | ICD-10-CM | POA: Insufficient documentation

## 2020-09-20 MED ORDER — AMITRIPTYLINE HCL 10 MG PO TABS
10.0000 mg | ORAL_TABLET | Freq: Every day | ORAL | 3 refills | Status: DC
  Filled 2020-09-20: qty 30, 30d supply, fill #0

## 2020-09-20 NOTE — Patient Instructions (Signed)
-  Discussed following foods that may reduce pain: 1) Ginger (especially studied for arthritis)- reduce leukotriene production to decrease inflammation 2) Blueberries- high in phytonutrients that decrease inflammation 3) Salmon- marine omega-3s reduce joint swelling and pain 4) Pumpkin seeds- reduce inflammation 5) dark chocolate- reduces inflammation 6) turmeric- reduces inflammation 7) tart cherries - reduce pain and stiffness 8) extra virgin olive oil - its compound olecanthal helps to block prostaglandins  9) chili peppers- can be eaten or applied topically via capsaicin 10) mint- helpful for headache, muscle aches, joint pain, and itching 11) garlic- reduces inflammation  Link to further information on diet for chronic pain: [email or web address]  

## 2020-09-20 NOTE — Addendum Note (Signed)
Addended by: Izora Ribas on: 09/20/2020 11:13 AM   Modules accepted: Orders

## 2020-09-20 NOTE — Progress Notes (Addendum)
Subjective:    Patient ID: Karen Whitehead, female    DOB: 10-31-1965, 55 y.o.   MRN: [id number]  HPI  Karen Whitehead is a 55 year old woman who presents to establish care for myalgias post-dengue fever.  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  -she gets lots of headaches -she has never tried topamax and amitriptyline   Insomnia: -she currently takes trazodone and sertraline.   Pain Inventory Average Pain 9 Pain Right Now 9 My pain is constant, sharp, stabbing, tingling and aching  In the last 24 hours, has pain interfered with the following? General activity 1 Relation with others 1 Enjoyment of life 0 What TIME of day is your pain at its worst? morning , daytime, evening and night Sleep (in general) Poor  Pain is worse with: walking, bending, inactivity and standing Pain improves with: therapy/exercise and medication Relief from Meds: 1  use a walker how many minutes can you walk? 5 ability to climb steps?  yes do you drive?  no  not employed: date last employed . I need assistance with the following:  dressing, bathing, household duties and shopping  bladder control problems weakness numbness tingling trouble walking spasms dizziness confusion depression anxiety suicidal thoughts  New pt  New pt    No family history on file. Social History   Socioeconomic History  . Marital status: Single    Spouse name: Not on file  . Number of children: Not on file  . Years of education: Not on file  . Highest education level: Not on file  Occupational History  . Not on file  Tobacco Use  . Smoking status: Current Every Day Smoker    Packs/day: 0.50    Types: Cigarettes  . Smokeless tobacco: Never Used  Substance and Sexual Activity   . Alcohol use: No  . Drug use: No  . Sexual activity: Not on file  Other Topics Concern  . Not on file  Social History Narrative  . Not on file   Social Determinants of Health   Financial Resource Strain: Not on file  Food Insecurity: Not on file  Transportation Needs: Not on file  Physical Activity: Not on file  Stress: Not on file  Social Connections: Not on file   Past Surgical History:  Procedure Laterality Date  . APPENDECTOMY     Past Medical History:  Diagnosis Date  . Granuloma annulare    BP 131/86   Pulse 82   Temp 99 F (37.2 C)   Ht 5\' 4"  (1.626 m)   Wt 218 lb 3.2 oz (99 kg)   SpO2 90%   BMI 37.45 kg/m   Opioid Risk Score:   Fall Risk Score:  `1  Depression screen PHQ 2/9  Depression screen [facility] 2/9 09/20/2020 02/06/2020 11/16/2019 11/03/2019  Decreased Interest 3 3 3 3   Down, Depressed, Hopeless 3 3 3 3   PHQ - 2 Score 6 6 6 6   Altered sleeping 3 3 3 3   Tired, decreased energy 3 3 3 3   Change in appetite 3 3 3 3   Feeling bad or failure about yourself  3 3 3 3   Trouble concentrating - 3 3 2   Moving slowly or fidgety/restless 2 1 0 -  Suicidal thoughts 1 2 1 1   PHQ-9 Score 21 24 22 21   Difficult doing work/chores Extremely dIfficult - - -    Review of Systems  Constitutional: Positive for appetite change.  Respiratory: Positive for cough and shortness of breath.   Gastrointestinal: Positive for constipation.  Musculoskeletal: Positive for back pain, gait problem and neck pain.       Left hip and knee pain Pain in back of legs  Neurological: Positive for dizziness, weakness and numbness.       Tongling  Psychiatric/Behavioral: Positive for confusion, dysphoric mood and suicidal ideas. The patient is nervous/anxious.   All other systems reviewed and are negative.      Objective:   Physical Exam Gen: no distress, normal appearing HEENT: oral mucosa pink and moist, NCAT Cardio: Reg rate Chest: normal effort, normal rate of breathing Abd:  soft, non-distended Ext: no edema Psych: pleasant, normal affect Skin: granuloma annulare over there arms and hands and spreading to her forehead.  Neuro: Alert and oriented x3. Weak throughout but particularly on left side. Tenderness to palpation of left knee and hip joints with limited range of motion.  Musculoskeletal: antalgic gait with RW    Assessment & Plan:  1)Chronic Pain Syndrome secondary to myalgias and arthralgias following Dengue fever -Discussed current symptoms of pain and history of pain.  -Discussed benefits of exercise in reducing pain. -obtain XR of left knee and hip -Discussed following foods that may reduce pain: 1) Ginger (especially studied for arthritis)- reduce leukotriene production to decrease inflammation 2) Blueberries- high in phytonutrients that decrease inflammation 3) Salmon- marine omega-3s reduce joint swelling and pain 4) Pumpkin seeds- reduce inflammation 5) dark chocolate- reduces inflammation 6) turmeric- reduces inflammation 7) tart cherries - reduce pain and stiffness 8) extra virgin olive oil - its compound olecanthal helps to block prostaglandins  9) chili peppers- can be eaten or applied topically via capsaicin 10) mint- helpful for headache, muscle aches, joint pain, and itching 11) garlic- reduces inflammation  Link to further information on diet for chronic pain: [email or web address]  2) Insomnia:  -amitriptyline 10mg  prescribed HS for sleep and pain  3) Left sided weakness: -MRI brain ordered to assess for possible contributory neurological process

## 2020-09-21 ENCOUNTER — Other Ambulatory Visit: Payer: Self-pay

## 2020-09-21 MED FILL — Glucose Blood Test Strip: 25 days supply | Qty: 100 | Fill #0 | Status: AC

## 2020-09-21 MED FILL — Sertraline HCl Tab 100 MG: ORAL | 30 days supply | Qty: 60 | Fill #0 | Status: AC

## 2020-09-21 MED FILL — Sertraline HCl Tab 100 MG: ORAL | 30 days supply | Qty: 60 | Fill #0 | Status: CN

## 2020-09-26 ENCOUNTER — Ambulatory Visit (HOSPITAL_COMMUNITY): Payer: No Payment, Other | Admitting: Licensed Clinical Social Worker

## 2020-09-26 ENCOUNTER — Ambulatory Visit: Payer: Self-pay | Admitting: Physical Therapy

## 2020-09-26 ENCOUNTER — Telehealth (HOSPITAL_COMMUNITY): Payer: Self-pay | Admitting: Licensed Clinical Social Worker

## 2020-09-26 NOTE — Telephone Encounter (Signed)
LCSW sent text link for video session per schedule. LCSW remained avail online for session until 10:12. Pt failed to sign into session.

## 2020-09-27 ENCOUNTER — Other Ambulatory Visit: Payer: Self-pay

## 2020-09-27 ENCOUNTER — Ambulatory Visit (HOSPITAL_COMMUNITY)
Admission: RE | Admit: 2020-09-27 | Discharge: 2020-09-27 | Disposition: A | Discharge status: Home / Self Care | Payer: Self-pay | Source: Ambulatory Visit | Attending: Physical Medicine and Rehabilitation | Admitting: Physical Medicine and Rehabilitation

## 2020-09-27 DIAGNOSIS — M25562 Pain in left knee: Secondary | ICD-10-CM

## 2020-09-27 DIAGNOSIS — R531 Weakness: Secondary | ICD-10-CM | POA: Insufficient documentation

## 2020-10-02 ENCOUNTER — Telehealth: Payer: Self-pay

## 2020-10-02 ENCOUNTER — Encounter (HOSPITAL_BASED_OUTPATIENT_CLINIC_OR_DEPARTMENT_OTHER): Payer: Self-pay | Admitting: Physical Medicine and Rehabilitation

## 2020-10-02 ENCOUNTER — Other Ambulatory Visit: Payer: Self-pay

## 2020-10-02 DIAGNOSIS — M545 Low back pain, unspecified: Secondary | ICD-10-CM

## 2020-10-02 DIAGNOSIS — R413 Other amnesia: Secondary | ICD-10-CM

## 2020-10-02 DIAGNOSIS — M542 Cervicalgia: Secondary | ICD-10-CM

## 2020-10-02 DIAGNOSIS — M546 Pain in thoracic spine: Secondary | ICD-10-CM

## 2020-10-02 MED ORDER — AMITRIPTYLINE HCL 25 MG PO TABS
25.0000 mg | ORAL_TABLET | Freq: Every day | ORAL | 3 refills | Status: DC
  Filled 2020-10-02 – 2020-10-15 (×2): qty 30, 30d supply, fill #0
  Filled 2020-11-13: qty 30, 30d supply, fill #1
  Filled 2020-12-04: qty 30, 30d supply, fill #2

## 2020-10-02 NOTE — Telephone Encounter (Signed)
Can you please schedule for 12:20 phone visit today to discuss XR and brain MRI results? Thank you!

## 2020-10-02 NOTE — Progress Notes (Addendum)
Subjective:    Patient ID: Karen Whitehead, female    DOB: 03/08/1966, 55 y.o.   MRN: [id number]  HPI  Due to national recommendations of social distancing because of COVID 31, an audio/video tele-health visit is felt to be the most appropriate encounter for this patient at this time. See MyChart message from today for the patient's consent to a tele-health encounter with [facility]. This is a follow up tele-visit via phone. The patient is at home. MD is at office.  Karen Whitehead is a 55 year old woman who presents for f/u of myalgias post-dengue fever.  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  -she gets lots of headaches -she has never tried topamax and amitriptyline  -reviewed her hip and knee XRs with her: negative -she would like to get XRs of her spine as well.   2)  Insomnia: -she currently takes trazodone and sertraline.  -she has not found relief with the amitriptyline 10mg .   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   Diet: her son usually cooks her meals for her.    Pain Inventory Average Pain 9 Pain Right Now 9 My pain is constant, sharp, stabbing, tingling and aching  In the last 24 hours, has pain interfered with the following? General activity 1 Relation with others 1 Enjoyment of life 0 What TIME of day is your pain at its worst? morning , daytime, evening and night Sleep (in general) Poor  Pain is worse with: walking, bending, inactivity and standing Pain improves with: therapy/exercise and medication Relief from Meds: 1  use a walker how  many minutes can you walk? 5 ability to climb steps?  yes do you drive?  no  not employed: date last employed . I need assistance with the following:  dressing, bathing, household duties and shopping  bladder control problems weakness numbness tingling trouble walking spasms dizziness confusion depression anxiety suicidal thoughts  New pt  New pt    No family history on file. Social History   Socioeconomic History  . Marital status: Single    Spouse name: Not on file  . Number of children: Not on file  . Years of education: Not on file  . Highest education level: Not on file  Occupational History  . Not on file  Tobacco Use  . Smoking status: Current Every Day Smoker    Packs/day: 0.50    Types: Cigarettes  . Smokeless tobacco: Never Used  Substance and Sexual Activity  . Alcohol use: No  . Drug use: No  . Sexual activity: Not on file  Other Topics Concern  . Not on file  Social History Narrative  . Not on file   Social Determinants of Health   Financial Resource Strain: Not on file  Food Insecurity: Not on file  Transportation Needs: Not on file  Physical Activity: Not on file  Stress: Not on file  Social Connections: Not on file   Past Surgical History:  Procedure Laterality Date  . APPENDECTOMY     Past Medical History:  Diagnosis Date  .  Granuloma annulare    There were no vitals taken for this visit.  Opioid Risk Score:   Fall Risk Score:  `1  Depression screen PHQ 2/9  Depression screen [facility] 2/9 09/20/2020 02/06/2020 11/16/2019 11/03/2019  Decreased Interest 3 3 3 3   Down, Depressed, Hopeless 3 3 3 3   PHQ - 2 Score 6 6 6 6   Altered sleeping 3 3 3 3   Tired, decreased energy 3 3 3 3   Change in appetite 3 3 3 3   Feeling bad or failure about yourself  3 3 3 3   Trouble concentrating - 3 3 2   Moving slowly or fidgety/restless 2 1 0 -  Suicidal thoughts 1 2 1 1   PHQ-9 Score 21 24 22 21   Difficult doing work/chores Extremely dIfficult - -  -    Review of Systems  Constitutional: Positive for appetite change.  Respiratory: Positive for cough and shortness of breath.   Gastrointestinal: Positive for constipation.  Musculoskeletal: Positive for back pain, gait problem and neck pain.       Left hip and knee pain Pain in back of legs  Neurological: Positive for dizziness, weakness and numbness.       Tongling  Psychiatric/Behavioral: Positive for confusion, dysphoric mood and suicidal ideas. The patient is nervous/anxious.   All other systems reviewed and are negative.      Objective:   Physical Exam Not performed as patient was seen via phone.     Assessment & Plan:  1)Chronic pain syndrome and fatigue secondary to myalgias and arthralgias following Dengue fever -Discussed current symptoms of pain and history of pain.  -Discussed benefits of exercise in reducing pain. -discussed that XRs of hip and knees are normal -ordered XRs of spine as well.  -Discussed following foods that may reduce pain: 1) Ginger (especially studied for arthritis)- reduce leukotriene production to decrease inflammation 2) Blueberries- high in phytonutrients that decrease inflammation 3) Salmon- marine omega-3s reduce joint swelling and pain 4) Pumpkin seeds- reduce inflammation 5) dark chocolate- reduces inflammation 6) turmeric- reduces inflammation 7) tart cherries - reduce pain and stiffness 8) extra virgin olive oil - its compound olecanthal helps to block prostaglandins  9) chili peppers- can be eaten or applied topically via capsaicin 10) mint- helpful for headache, muscle aches, joint pain, and itching 11) garlic- reduces inflammation  Link to further information on diet for chronic pain: [email or web address]  2) Insomnia:  -increase amitriptyline to 25mg .   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

## 2020-10-02 NOTE — Telephone Encounter (Signed)
(  Patient aware Dr. Ranell Patrick is not currently  in the office).   Karen Whitehead called for her recent imaging results. Please call her back on phone [phone].

## 2020-10-03 ENCOUNTER — Ambulatory Visit: Payer: Self-pay | Admitting: Physical Therapy

## 2020-10-09 ENCOUNTER — Other Ambulatory Visit: Payer: Self-pay

## 2020-10-15 ENCOUNTER — Other Ambulatory Visit: Payer: Self-pay

## 2020-10-15 ENCOUNTER — Other Ambulatory Visit: Payer: Self-pay | Admitting: Family Medicine

## 2020-10-15 MED ORDER — ATORVASTATIN CALCIUM 20 MG PO TABS
20.0000 mg | ORAL_TABLET | Freq: Every day | ORAL | 0 refills | Status: DC
  Filled 2020-10-15: qty 30, 30d supply, fill #0

## 2020-10-15 MED FILL — Trazodone HCl Tab 100 MG: ORAL | 30 days supply | Qty: 60 | Fill #0 | Status: AC

## 2020-10-15 MED FILL — Sertraline HCl Tab 100 MG: ORAL | 30 days supply | Qty: 60 | Fill #1 | Status: AC

## 2020-10-15 NOTE — Telephone Encounter (Signed)
  Notes to clinic:  review for refill Looks like medication was d/c in error    Requested Prescriptions  Pending Prescriptions Disp Refills   atorvastatin (LIPITOR) 20 MG tablet 30 tablet 3    Sig: TAKE 1 TABLET (20 MG TOTAL) BY MOUTH DAILY.      Cardiovascular:  Antilipid - Statins Failed - 10/15/2020  9:35 AM      Failed - Total Cholesterol in normal range and within 360 days    No results found for: CHOL, POCCHOL, CHOLTOT        Failed - LDL in normal range and within 360 days    No results found for: LDLCALC, LDLC, HIRISKLDL, POCLDL, LDLDIRECT, REALLDLC, TOTLDLC        Failed - HDL in normal range and within 360 days    No results found for: HDL, POCHDL        Failed - Triglycerides in normal range and within 360 days    No results found for: TRIG, POCTRIG        Passed - Patient is not pregnant      Passed - Valid encounter within last 12 months    Recent Outpatient Visits           2 months ago Type 2 diabetes mellitus with hyperglycemia, without long-term current use of insulin ([place])   [place], [place], MD   5 months ago Chronic pain of both knees   [place], [place], MD   8 months ago Type 2 diabetes mellitus with other specified complication, without long-term current use of insulin ([place])   [place], [place], MD   9 months ago Type 2 diabetes mellitus with hyperglycemia, without long-term current use of insulin ([place])   [place], [place], MD   10 months ago Type 2 diabetes mellitus with hyperglycemia, without long-term current use of insulin [facility])   [place], Dionne [place], [place]       Future Appointments             In 2 weeks Charlott Rakes, MD [place]

## 2020-10-16 ENCOUNTER — Other Ambulatory Visit: Payer: Self-pay

## 2020-10-19 ENCOUNTER — Other Ambulatory Visit: Payer: Self-pay

## 2020-10-19 ENCOUNTER — Telehealth: Payer: Self-pay

## 2020-10-19 ENCOUNTER — Other Ambulatory Visit: Payer: Self-pay | Admitting: Physical Medicine and Rehabilitation

## 2020-10-19 MED ORDER — TOPIRAMATE 25 MG PO TABS
25.0000 mg | ORAL_TABLET | Freq: Every day | ORAL | 3 refills | Status: DC
  Filled 2020-10-19 – 2020-10-31 (×3): qty 30, 30d supply, fill #0
  Filled 2020-12-04: qty 30, 30d supply, fill #1
  Filled 2020-12-31: qty 30, 30d supply, fill #2

## 2020-10-19 NOTE — Telephone Encounter (Signed)
Karen Whitehead called:   Patient stated none of her medications are helping her pain. She complains of headaches & muscles locking up. Patient requested a pain medicine to be sent to the pharmacy for relief.   Patient advised if the pain is uncontrolled to call her PCP for a sooner appointment. Or see the PA / NP in the office. If not able to do so go to an Urgent Care, for immediate help. Dr. Ranell Patrick will be informed.  Per patient her phone number [phone] is not working.  But you can call her [facility] [place] phone [phone] and leave a message.  However [name] is out of town but will back tomorrow (Saturday). Patient advised again go to an Urgent Care or call PCP office.

## 2020-10-22 ENCOUNTER — Other Ambulatory Visit: Payer: Self-pay

## 2020-10-22 ENCOUNTER — Telehealth (HOSPITAL_COMMUNITY): Payer: Self-pay | Admitting: Psychiatry

## 2020-10-22 ENCOUNTER — Telehealth (HOSPITAL_COMMUNITY): Payer: No Payment, Other | Admitting: Psychiatry

## 2020-10-22 NOTE — Telephone Encounter (Signed)
I SENT THE LINK X 2 TO HER PHONE AND CALLED HER NUMBER X 3 FOR THE SCHEDULED APPOINTMENT TODAY. 'THE PHONE NUMBER IS NOT IN SERVICE' IS THE RESPONSE I RECEIVED.

## 2020-10-23 ENCOUNTER — Other Ambulatory Visit: Payer: Self-pay

## 2020-10-23 NOTE — Telephone Encounter (Signed)
I spoke with Karen Whitehead.

## 2020-10-23 NOTE — Telephone Encounter (Signed)
Please try to call patient. She had issues with her phone service on Friday.   THANK YOU!

## 2020-10-26 ENCOUNTER — Ambulatory Visit (HOSPITAL_COMMUNITY): Payer: No Payment, Other | Admitting: Licensed Clinical Social Worker

## 2020-10-29 ENCOUNTER — Other Ambulatory Visit: Payer: Self-pay

## 2020-10-31 ENCOUNTER — Ambulatory Visit: Payer: Self-pay | Attending: Family Medicine | Admitting: Family Medicine

## 2020-10-31 ENCOUNTER — Other Ambulatory Visit: Payer: Self-pay

## 2020-10-31 ENCOUNTER — Encounter: Payer: Self-pay | Admitting: Family Medicine

## 2020-10-31 VITALS — BP 113/79 | HR 79 | Ht 64.0 in | Wt 219.8 lb

## 2020-10-31 DIAGNOSIS — E1165 Type 2 diabetes mellitus with hyperglycemia: Secondary | ICD-10-CM

## 2020-10-31 DIAGNOSIS — M791 Myalgia, unspecified site: Secondary | ICD-10-CM

## 2020-10-31 DIAGNOSIS — F331 Major depressive disorder, recurrent, moderate: Secondary | ICD-10-CM

## 2020-10-31 DIAGNOSIS — L920 Granuloma annulare: Secondary | ICD-10-CM

## 2020-10-31 DIAGNOSIS — G894 Chronic pain syndrome: Secondary | ICD-10-CM

## 2020-10-31 LAB — POCT GLYCOSYLATED HEMOGLOBIN (HGB A1C): HbA1c, POC (controlled diabetic range): 6.4 % (ref 0.0–7.0)

## 2020-10-31 LAB — GLUCOSE, POCT (MANUAL RESULT ENTRY): POC Glucose: 128 mg/dl — AB (ref 70–99)

## 2020-10-31 MED ORDER — GABAPENTIN 300 MG PO CAPS
300.0000 mg | ORAL_CAPSULE | Freq: Two times a day (BID) | ORAL | 6 refills | Status: DC
  Filled 2020-10-31: qty 60, 30d supply, fill #0
  Filled 2020-12-04: qty 60, 30d supply, fill #1
  Filled 2020-12-31: qty 60, 30d supply, fill #2
  Filled 2021-01-31: qty 60, 30d supply, fill #3
  Filled 2021-03-06 – 2021-03-14 (×2): qty 60, 30d supply, fill #4
  Filled 2021-04-11: qty 60, 30d supply, fill #5

## 2020-10-31 MED ORDER — LIDOCAINE 5 % EX PTCH
1.0000 | MEDICATED_PATCH | CUTANEOUS | 6 refills | Status: DC
  Filled 2020-10-31: qty 30, 30d supply, fill #0

## 2020-10-31 MED ORDER — CYCLOBENZAPRINE HCL 10 MG PO TABS
10.0000 mg | ORAL_TABLET | Freq: Two times a day (BID) | ORAL | 6 refills | Status: AC | PRN
  Filled 2020-10-31: qty 60, 30d supply, fill #0
  Filled 2020-12-04: qty 60, 30d supply, fill #1
  Filled 2020-12-31: qty 60, 30d supply, fill #2
  Filled 2021-01-31: qty 60, 30d supply, fill #3
  Filled 2021-03-06 – 2021-03-14 (×2): qty 60, 30d supply, fill #4
  Filled 2021-04-11: qty 60, 30d supply, fill #5
  Filled 2021-05-17: qty 60, 30d supply, fill #0

## 2020-10-31 MED ORDER — ATORVASTATIN CALCIUM 20 MG PO TABS
20.0000 mg | ORAL_TABLET | Freq: Every day | ORAL | 6 refills | Status: DC
Start: 2020-10-31 — End: 2020-11-01
  Filled 2020-10-31: qty 30, 30d supply, fill #0

## 2020-10-31 NOTE — Patient Instructions (Signed)

## 2020-10-31 NOTE — Progress Notes (Signed)
 Subjective:  Patient ID: Karen Whitehead, female    DOB: 11/22/1965  Age: 55 y.o. 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  Interval History: Her PT sesions were discontinued due to ongoing pain.  Her rehab medicine physician had referred her to neurology due to abnormal MRI brain findings which revealed: IMPRESSION: No acute or reversible finding. Generalized brain volume loss, more than usually seen at this age. Few scattered punctate foci T2 and FLAIR signal in the hemispheric white matter consistent mild chronic small vessel change.  Her walker has been helpful but she will need one where she can sit She has pain in her legs, joints. Her muscle relaxant has been ineffective and she wakes up at night with her thighs and legs cramping up and hands sometimes. Diabetes is controlled on her current regimen. She is followed by mental health for management of major depressive disorder.  She continues to use a topical creams for her chronic skin rash Past Medical History:  Diagnosis Date   Granuloma annulare     Past Surgical History:  Procedure Laterality Date   APPENDECTOMY      History reviewed. No pertinent family history.  No Known Allergies  Outpatient Medications Prior to Visit  Medication Sig Dispense Refill   albuterol (VENTOLIN HFA) 108 (90 Base) MCG/ACT inhaler INHALE 1-2 PUFFS INTO THE LUNGS EVERY 6 (SIX) HOURS AS NEEDED FOR WHEEZING OR SHORTNESS OF BREATH. 18 g 1   amitriptyline (ELAVIL) 25 MG tablet Take 1 tablet (25 mg total) by mouth at bedtime. 30 tablet 3   Blood Glucose Monitoring Suppl (TRUE METRIX METER) w/Device KIT 1 each by Does not apply route in the morning and at bedtime. 1 kit 0   busPIRone (BUSPAR) 7.5 MG tablet TAKE 1 TABLET (7.5 MG TOTAL) BY MOUTH 2 (TWO) TIMES DAILY. 60 tablet 3   clonazePAM  (KLONOPIN) 1 MG tablet Take 1 tablet (1 mg total) by mouth 2 (two) times daily as needed. for anxiety 60 tablet 1   dapagliflozin propanediol (FARXIGA) 5 MG TABS tablet TAKE 1 TABLET (5 MG TOTAL) BY MOUTH DAILY BEFORE BREAKFAST. 30 tablet 3   fluticasone (CUTIVATE) 0.005 % ointment Apply 1 application topically 2 (two) times daily.     glipiZIDE (GLUCOTROL) 5 MG tablet TAKE 0.5 TABLETS (2.5 MG TOTAL) BY MOUTH 2 (TWO) TIMES DAILY BEFORE A MEAL. 30 tablet 3   glucose blood (TRUE METRIX BLOOD GLUCOSE TEST) test strip Use as instructed 100 each 12   glucose blood test strip USE AS INSTRUCTED 100 strip 12   meloxicam (MOBIC) 7.5 MG tablet TAKE 1 TABLET (7.5 MG TOTAL) BY MOUTH DAILY. 30 tablet 3   QUEtiapine (SEROQUEL) 200 MG tablet Take 1 tablet (200 mg total) by mouth at bedtime. 30 tablet 1   sertraline (ZOLOFT) 100 MG tablet TAKE 2 TABLETS BY MOUTH ONCE A DAY 60 tablet 1   topiramate (TOPAMAX) 25 MG tablet Take 1 tablet (25 mg total) by mouth at bedtime. 30 tablet 3   traZODone (DESYREL) 100 MG tablet TAKE 2 TABLETS BY MOUTH AT BEDTIME 60 tablet 1   TRUEplus Lancets 28G MISC Use as directed in the morning and at bedtime. 100 each 1   atorvastatin (LIPITOR) 20 MG tablet Take 1 tablet (20 mg total) by mouth daily. 30 tablet 0   cyclobenzaprine (FLEXERIL)   10 MG tablet Take 1 tablet (10 mg total) by mouth at bedtime. Prn muscle pain 30 tablet 3   albuterol (VENTOLIN HFA) 108 (90 Base) MCG/ACT inhaler INHALE 1-2 PUFFS INTO THE LUNGS EVERY 6 (SIX) HOURS AS NEEDED FOR WHEEZING OR SHORTNESS OF BREATH. 18 g 1   gabapentin (NEURONTIN) 300 MG capsule Take 1 capsule (300 mg total) by mouth at bedtime. (Patient not taking: No sig reported) 30 capsule 3   gabapentin (NEURONTIN) 300 MG capsule TAKE 1 CAPSULE (300 MG TOTAL) BY MOUTH AT BEDTIME. (Patient not taking: No sig reported) 30 capsule 3   glipiZIDE (GLUCOTROL) 5 MG tablet Take 0.5 tablets (2.5 mg total) by mouth 2 (two) times daily before a meal. 30 tablet 3    glipiZIDE (GLUCOTROL) 5 MG tablet TAKE 1/2 TABLET (2.5 MG TOTAL) BY MOUTH 2 (TWO) TIMES DAILY BEFORE A MEAL. 30 tablet 3   No facility-administered medications prior to visit.     ROS Review of Systems  Constitutional:  Positive for appetite change and fatigue. Negative for activity change.  HENT:  Negative for congestion, sinus pressure and sore throat.   Eyes:  Negative for visual disturbance.  Respiratory:  Negative for cough, chest tightness, shortness of breath and wheezing.   Cardiovascular:  Negative for chest pain and palpitations.  Gastrointestinal:  Negative for abdominal distention, abdominal pain and constipation.  Endocrine: Negative for polydipsia.  Genitourinary:  Negative for dysuria and frequency.  Musculoskeletal:        See HPI  Skin:  Positive for rash.  Neurological:  Negative for tremors, light-headedness and numbness.  Hematological:  Does not bruise/bleed easily.  Psychiatric/Behavioral:  Positive for dysphoric mood. Negative for agitation and behavioral problems.    Objective:  BP 113/79   Pulse 79   Ht 5' 4" (1.626 m)   Wt 219 lb 12.8 oz (99.7 kg)   SpO2 95%   BMI 37.73 kg/m   BP/Weight 10/31/2020 09/20/2020 6/62/9476  Systolic BP 546 503 546  Diastolic BP 79 86 84  Wt. (Lbs) 219.8 218.2 211.2  BMI 37.73 37.45 36.25      Physical Exam Constitutional:      Appearance: She is well-developed.  Neck:     Vascular: No JVD.  Cardiovascular:     Rate and Rhythm: Normal rate.     Heart sounds: Normal heart sounds. No murmur heard. Pulmonary:     Effort: Pulmonary effort is normal.     Breath sounds: Normal breath sounds. No wheezing or rales.  Chest:     Chest wall: No tenderness.  Abdominal:     General: Bowel sounds are normal. There is no distension.     Palpations: Abdomen is soft. There is no mass.     Tenderness: There is no abdominal tenderness.  Musculoskeletal:        General: Normal range of motion.     Right lower leg: No  edema.     Left lower leg: No edema.  Skin:    Comments: Large rash on different aspects of body surface  Neurological:     Mental Status: She is alert and oriented to person, place, and time.  Psychiatric:     Comments: Dysphoric mood    CMP Latest Ref Rng & Units 10/16/2019 10/16/2019 02/19/2016  Glucose 70 - 99 mg/dL - 153(H) 111(H)  BUN 6 - 20 mg/dL - 7 7  Creatinine 0.44 - 1.00 mg/dL - 0.87 0.87  Sodium 135 - 145 mmol/L 139 138 139  Potassium 3.5 - 5.1 mmol/L 3.4(L) 3.5 4.0  Chloride 98 - 111 mmol/L - 100 106  CO2 22 - 32 mmol/L - 24 25  Calcium 8.9 - 10.3 mg/dL - 9.8 9.8    Lipid Panel  No results found for: CHOL, TRIG, HDL, CHOLHDL, VLDL, LDLCALC, LDLDIRECT  CBC    Component Value Date/Time   WBC 10.5 10/16/2019 1515   RBC 4.82 10/16/2019 1515   HGB 15.6 (H) 10/16/2019 1751   HCT 46.0 10/16/2019 1751   PLT 289 10/16/2019 1515   MCV 92.5 10/16/2019 1515   MCH 31.5 10/16/2019 1515   MCHC 34.1 10/16/2019 1515   RDW 12.6 10/16/2019 1515   LYMPHSABS 3.0 11/10/2018 1315   MONOABS 0.6 11/10/2018 1315   EOSABS 0.3 11/10/2018 1315   BASOSABS 0.1 11/10/2018 1315    Lab Results  Component Value Date   HGBA1C 6.4 10/31/2020    Assessment & Plan:  1. Type 2 diabetes mellitus with hyperglycemia, without long-term current use of insulin (HCC) Controlled with A1c of 6.4 Continue current regimen Counseled on Diabetic diet, my plate method, 110 minutes of moderate intensity exercise/week Blood sugar logs with fasting goals of 80-120 mg/dl, random of less than 180 and in the event of sugars less than 60 mg/dl or greater than 400 mg/dl encouraged to notify the clinic. Advised on the need for annual eye exams, annual foot exams, Pneumonia vaccine. - POCT glucose (manual entry) - POCT glycosylated hemoglobin (Hb A1C) - atorvastatin (LIPITOR) 20 MG tablet; Take 1 tablet (20 mg total) by mouth daily.  Dispense: 30 tablet; Refill: 6 - Lipid panel  2. Muscle  pain Uncontrolled Increased dose of Flexeril - cyclobenzaprine (FLEXERIL) 10 MG tablet; Take 1 tablet (10 mg total) by mouth 2 (two) times daily as needed for muscle spasms as needed for muscle pain  Dispense: 60 tablet; Refill: 6  3. 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 Chronic pain syndrome Uncontrolled Unable to tolerate PT We will increase gabapentin dose and add Lidoderm to regimen She may benefit from aquatic therapy We have also discussed goals of pain control and the fact that 0 pain might be unachievable.  Massage, yoga and other nonpharmacological methods have been discussed Case manager and social worker met with the patient to assist in securing walker - gabapentin (NEURONTIN) 300 MG capsule; Take 1 capsule (300 mg total) by mouth 2 (two) times daily.  Dispense: 60 capsule; Refill: 6 - lidocaine (LIDODERM) 5 %; Place 1 patch onto the skin daily. Remove & Discard patch within 12 hours or as directed by MD  Dispense: 30 patch; Refill: 6 - Ambulatory referral to Physical Therapy  5. Moderate episode of recurrent major depressive disorder ([place]) Uncontrolled Currently on antidepressants per psych She may benefit from Cymbalta due to her chronic pain and has been advised to discuss this with her psychiatrist    Meds ordered this encounter  Medications   gabapentin (NEURONTIN) 300 MG capsule    Sig: Take 1 capsule (300 mg total) by mouth 2 (two) times daily.    Dispense:  60 capsule    Refill:  6   lidocaine (LIDODERM) 5 %    Sig: Place 1 patch onto the skin daily. Remove & Discard patch within 12 hours or as directed by MD    Dispense:  30 patch    Refill:  6   cyclobenzaprine (FLEXERIL) 10 MG tablet    Sig: Take 1 tablet (10 mg total)  by mouth 2 (two) times daily as needed for muscle spasms as needed for muscle pain    Dispense:  60 tablet    Refill:  6   atorvastatin (LIPITOR) 20 MG tablet    Sig: Take 1 tablet (20  mg total) by mouth daily.    Dispense:  30 tablet    Refill:  6    Must have office visit for refills    Follow-up: Return in about 1 month (around 11/30/2020) for complete physical exam.       Charlott Rakes, MD, FAAFP. [facility] and [place], [place]   10/31/2020, 5:52 PM

## 2020-11-01 ENCOUNTER — Other Ambulatory Visit: Payer: Self-pay | Admitting: Family Medicine

## 2020-11-01 ENCOUNTER — Other Ambulatory Visit: Payer: Self-pay

## 2020-11-01 LAB — LIPID PANEL
Chol/HDL Ratio: 6 ratio — ABNORMAL HIGH (ref 0.0–4.4)
Cholesterol, Total: 282 mg/dL — ABNORMAL HIGH (ref 100–199)
HDL: 47 mg/dL (ref >39)
LDL Chol Calc (NIH): 185 mg/dL — ABNORMAL HIGH (ref 0–99)
Triglycerides: 257 mg/dL — ABNORMAL HIGH (ref 0–149)
VLDL Cholesterol Cal: 50 mg/dL — ABNORMAL HIGH (ref 5–40)

## 2020-11-01 MED ORDER — ATORVASTATIN CALCIUM 40 MG PO TABS
40.0000 mg | ORAL_TABLET | Freq: Every day | ORAL | 3 refills | Status: DC
  Filled 2020-11-01: qty 90, 90d supply, fill #0
  Filled 2020-11-13 – 2020-11-14 (×2): qty 30, 30d supply, fill #0
  Filled 2020-12-04: qty 30, 30d supply, fill #1
  Filled 2020-12-31: qty 30, 30d supply, fill #2
  Filled 2021-01-31: qty 30, 30d supply, fill #3
  Filled 2021-03-06 – 2021-03-14 (×2): qty 30, 30d supply, fill #4
  Filled 2021-04-11: qty 30, 30d supply, fill #5
  Filled 2021-05-22: qty 30, 30d supply, fill #0
  Filled 2021-06-25: qty 30, 30d supply, fill #1
  Filled 2021-08-05 – 2021-08-14 (×3): qty 30, 30d supply, fill #2
  Filled 2021-09-26: qty 30, 30d supply, fill #3
  Filled 2021-10-31 (×2): qty 30, 30d supply, fill #4

## 2020-11-02 ENCOUNTER — Ambulatory Visit (HOSPITAL_COMMUNITY): Payer: No Payment, Other | Admitting: Licensed Clinical Social Worker

## 2020-11-08 ENCOUNTER — Other Ambulatory Visit: Payer: Self-pay

## 2020-11-13 ENCOUNTER — Other Ambulatory Visit: Payer: Self-pay | Admitting: Family Medicine

## 2020-11-13 ENCOUNTER — Other Ambulatory Visit: Payer: Self-pay

## 2020-11-13 MED FILL — Trazodone HCl Tab 100 MG: ORAL | 30 days supply | Qty: 60 | Fill #1 | Status: AC

## 2020-11-13 NOTE — Telephone Encounter (Signed)
    Notes to clinic:  medication filled by a different provider  Review for refill    Requested Prescriptions  Pending Prescriptions Disp Refills   sertraline (ZOLOFT) 100 MG tablet 60 tablet 1    Sig: TAKE 2 TABLETS BY MOUTH ONCE A DAY      Psychiatry:  Antidepressants - SSRI Passed - 11/13/2020 12:54 PM      Passed - Completed PHQ-2 or PHQ-9 in the last 360 days      Passed - Valid encounter within last 6 months    Recent Outpatient Visits           1 week ago Type 2 diabetes mellitus with hyperglycemia, without long-term current use of insulin ([place])   [place], [place], MD   3 months ago Type 2 diabetes mellitus with hyperglycemia, without long-term current use of insulin ([place])   [place], [place], MD   6 months ago Chronic pain of both knees   [place], [place], MD   9 months ago Type 2 diabetes mellitus with other specified complication, without long-term current use of insulin ([place])   [place], [place], MD   10 months ago Type 2 diabetes mellitus with hyperglycemia, without long-term current use of insulin ([place])   [place], MD       Future Appointments             In 1 month Charlott Rakes, MD [place]

## 2020-11-14 ENCOUNTER — Other Ambulatory Visit: Payer: Self-pay

## 2020-11-14 ENCOUNTER — Other Ambulatory Visit: Payer: Self-pay | Admitting: Family Medicine

## 2020-11-19 ENCOUNTER — Other Ambulatory Visit: Payer: Self-pay

## 2020-11-19 ENCOUNTER — Encounter (HOSPITAL_BASED_OUTPATIENT_CLINIC_OR_DEPARTMENT_OTHER): Payer: Self-pay | Admitting: Physical Therapy

## 2020-11-19 ENCOUNTER — Ambulatory Visit (HOSPITAL_BASED_OUTPATIENT_CLINIC_OR_DEPARTMENT_OTHER): Payer: Self-pay | Attending: Family Medicine | Admitting: Physical Therapy

## 2020-11-19 DIAGNOSIS — R296 Repeated falls: Secondary | ICD-10-CM

## 2020-11-19 DIAGNOSIS — M545 Low back pain, unspecified: Secondary | ICD-10-CM

## 2020-11-19 DIAGNOSIS — G8929 Other chronic pain: Secondary | ICD-10-CM

## 2020-11-19 DIAGNOSIS — M25561 Pain in right knee: Secondary | ICD-10-CM | POA: Insufficient documentation

## 2020-11-19 DIAGNOSIS — M25562 Pain in left knee: Secondary | ICD-10-CM | POA: Insufficient documentation

## 2020-11-19 DIAGNOSIS — R278 Other lack of coordination: Secondary | ICD-10-CM

## 2020-11-19 DIAGNOSIS — R262 Difficulty in walking, not elsewhere classified: Secondary | ICD-10-CM

## 2020-11-19 DIAGNOSIS — M25512 Pain in left shoulder: Secondary | ICD-10-CM | POA: Insufficient documentation

## 2020-11-19 DIAGNOSIS — M6281 Muscle weakness (generalized): Secondary | ICD-10-CM

## 2020-11-19 NOTE — Therapy (Signed)
[place], [place], [address] Phone: [phone]   Fax:  [phone]  Physical Therapy Evaluation  Patient Details  Name: Karen Whitehead MRN: [id number] Date of Birth: [birth year] Referring Provider (PT): Dr. Charlott Rakes   Encounter Date: 11/19/2020   PT End of Session - 11/19/20 1648     Visit Number 1    Number of Visits 24    Date for PT Re-Evaluation 12/17/20    PT Start Time 1155    PT Stop Time 1255    PT Time Calculation (min) 60 min    Equipment Utilized During Treatment Gait belt;Other (comment)   FWW   Activity Tolerance Patient limited by fatigue   Pt was fatigued with eval and had labored breathing   Behavior During Therapy WFL for tasks assessed/performed             Past Medical History:  Diagnosis Date   Granuloma annulare     Past Surgical History:  Procedure Laterality Date   APPENDECTOMY      There were no vitals filed for this visit.    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  Pt contracted dengue fever from a mosquito bite while she was in the [place]   Pt has anxiety and doesn't like to be around people.  Pt has memory loss issues.  Her pain became more consistent the past 2 years and has significantly worsened in the past year. Pt was receiving PT from March to April and was put on hold due to continued high levels of pain.  She reports no progress with land based PT.  Pt was referred back to MD and then discharged from skilled PT.  Pt returned to MD and has orders from MD for aquatic therapy--pt with chronic pain, unable to undergo regular PT due to pain.   Pt states sometimes she can't get out of bed due to tightness and pain.  Pt states she only ambulates at home and doesn't ambulate outside of home except going to MD Pt states she doesn't do  anything at her home and her son takes care of most things.  Sometimes unable to pick up a pot to wash dishes or cook.  She is significantly limited with mobility.  Difficulty and limtiations with standing, ambulation, and transfers.  Back pain with standing.  Pt c/o's of pain all over her body in bilat UE's and LE's and her back.  She has pain in L > R shoulder and L knee joint.  Pt has has 8-10 falls in the past 6 months. She began using a walker a couple of months ago.  She has had 2 falls in the past month.  Pt does not drive.    Pertinent History Fall Risk.  Granuloma Annulare, major depressive disoder, legionnairre's disease, dengue fever, DM, memory loss, anxiety.    Limitations Sitting;Walking;Standing;House hold activities    How long can you stand comfortably? 5-10 mins    How long can you walk comfortably? just ambulating in home.    Diagnostic tests Brain MRI:  IMPRESSION:  No acute or reversible finding. Generalized brain volume loss, more  than usually seen at this age. Few scattered punctate foci T2 and  FLAIR signal in the hemispheric white matter consistent mild chronic  small vessel change. per MD note.  Pt's hip and  knee x ray are negative.    Patient Stated Goals improve pain, walking, mobility, and anxiety.    Currently in Pain? Yes    Pain Score 7    7/10 best pain and 25/10 worst pain   Pain Location --   Generalized pain including back, upper body, lower body, L > R shoulder, L knee > R knee.   Pain Orientation Right;Left    Pain Descriptors / Indicators Constant    Pain Type Chronic pain    Pain Onset More than a month ago    Pain Frequency Constant    Effect of Pain on Daily Activities severely limited with activity, ambulation, transfers, and standing duration.  Pt unable to perform household chores.  Balance.  Difficulty with bed mobility.    Multiple Pain Sites Yes                [facility] PT Assessment - 11/19/20 0001       Assessment   Medical Diagnosis Chronic  Pain Syndrome    Referring Provider (PT) Dr. Charlott Rakes    Onset Date/Surgical Date --   Pain has worsened in the past 2 years though significantly worsened over the past year   Next MD Visit 12/17/20    Prior Therapy Land based PT in March to April      Precautions   Precautions Fall    Precaution Comments Requires AD with ambulation      Restrictions   Weight Bearing Restrictions No      Balance Screen   Has the patient fallen in the past 6 months Yes    How many times? 8-10    Has the patient had a decrease in activity level because of a fear of falling?  Yes    Is the patient reluctant to leave their home because of a fear of falling?  Yes      [place] residence    Living Arrangements Other (Comment)   Lives with son   Additional Comments 1 story home with 2 steps to enter home.  no rail.  her neighbor or son typically helps her to perform stairs.      Prior Function   Level of Independence --   Pt requires assistance to perform ADLs/IADLs which has progressively worsened over time.     Cognition   Overall Cognitive Status --   Pt able to answer questions competently and follow directions.  Brain MRI indicated more than normal brain volume loss for age.   Memory --   Pt has memory loss issues.     Observation/Other Assessments   Skin Integrity Pt has granuloma annulare and has rash on bilat UEs and hands and upper chest. Pt states she also has this rash on legs      Functional Tests   Functional tests --   TUG: 2 min 40 sec with FWW.  She required CGA and occasionally min assist toward the end of the test.  She had much difficulty clearing her L LE and was dragging her L foot. She became shaky toward the end of the TUG test.     ROM / Strength   AROM / PROM / Strength Strength;AROM      AROM   AROM Assessment Site Ankle    Right/Left Ankle Right;Left    Right Ankle Dorsiflexion 9    Left Ankle Dorsiflexion --   lacking 10 deg  from neutral     Strength  Strength Assessment Site Hip;Knee;Ankle    Right/Left Hip Right;Left    Right Hip Flexion --   <3/5   Right Hip ABduction --   Able to tolerate good resistance in stting   Left Hip Flexion --   <3/5   Left Hip ABduction --   Weak, tested in sitting   Right/Left Knee Right;Left    Right Knee Flexion --   Able to tolerate minimal resistance in stting   Right Knee Extension --   Unble to tolerate resistance   Left Knee Flexion --   Unble to tolerate resistance in stting   Left Knee Extension --   Unble to tolerate resistance   Right/Left Ankle Right;Left    Right Ankle Dorsiflexion --   Unble to tolerate resistance   Right Ankle Plantar Flexion --   weak, tested in sitting, but able to tolerate min/mod resistance   Left Ankle Dorsiflexion --   Unble to tolerate resistance   Left Ankle Plantar Flexion --   weak, tested in sitting, but able to tolerate min resistance     Transfers   Comments Pt requires min assist and instruction in correct form/positiong with transfers.  Pt unable to stand without assistance.      Ambulation/Gait   Ambulation/Gait Yes    Ambulation/Gait Assistance --   Pt is limited in distance.   Assistive device --   FWW   Gait Comments Pt has decreased foot clearance and step length bilat worse on L.  Pt has a very slow gait speed.  Her L LE was dragging at times.  CGA/min assist.  Pt requires cuing to keep her walker close.                        Objective measurements completed on examination: See above findings.               PT Education - 11/19/20 1618     Education Details Educated pt concerning POC, objective findings, and the process of aquatic therapy.  Answered Pt's questions.    Person(s) Educated Patient    Methods Explanation;Demonstration    Comprehension Verbalized understanding              PT Short Term Goals - 11/19/20 2157       PT SHORT TERM GOAL #1   Title Pt will be able to  perform sit to stand transfers safely and independently.    Baseline requires min assist    Time 3    Period Weeks    Status New    Target Date 12/10/20      PT SHORT TERM GOAL #2   Title Pt will report at least a 25% improvement overall in pain and mobility.    Time 4    Period Weeks    Status New    Target Date 12/17/20      PT SHORT TERM GOAL #3   Title Pt will ambulate at least 100 ft with improved gait speed and correct usage of walker.    Baseline Pt had difficulty including L LE dragging with limited foot clearance after ambulating 15 ft    Time 4    Period Weeks    Target Date 12/17/20      PT SHORT TERM GOAL #4   Title Pt will  improve TUG time to no > 34min and 20 seconds with walker.    Baseline 69min 40 seconds    Time 5  Period Weeks    Status New    Target Date 12/24/20      PT SHORT TERM GOAL #5   Title Pt will demo improved LE strength to 4- to 4/5 MMT in hip flexion, knee flex and extension, and DF bilat for improved mobility, ambulation, and tolerance to activity.    Baseline see objective findings in IE    Time 6    Period Weeks    Status New    Target Date 12/31/20      Additional Short Term Goals   Additional Short Term Goals Yes      PT SHORT TERM GOAL #6   Title Pt will report improved standing duration at home to improve performance of self care activities and light IADLs.    Time 5    Period Weeks    Status New    Target Date 12/24/20               PT Long Term Goals - 11/19/20 2220       PT LONG TERM GOAL #1   Title Pt will ambulate at least 200 ft with walker with no > than SBA/CGA without having to sit down    Time 8    Period Weeks    Status New    Target Date 01/14/21      PT LONG TERM GOAL #2   Title Pt will tolerate aquatic therapy without significant increased pain in order to establish an exercise routine to improve functional mobility and tolerance to activity.    Time 8    Period Weeks    Status New    Target  Date 01/14/21      PT LONG TERM GOAL #3   Title Pt will be able to perform TUG in no > than 20 sec for improved mobility and gait speed.    Time --   10-12   Period Weeks    Status New    Target Date 02/11/21      PT LONG TERM GOAL #4   Title Pt will be able to ambulate community distance with walker without significant pain or difficulty.    Time --   10-12   Period Weeks    Target Date 02/11/21      PT LONG TERM GOAL #5   Title Pt will report she is able to perform bed mobility, self care activities, and some light IADLs in her home without signifiant back and LE pain.    Time --   10-12   Period Weeks    Target Date 02/11/21      Additional Long Term Goals   Additional Long Term Goals Yes      PT LONG TERM GOAL #6   Title Pt will be able to perform aquatic program with appropriate assistance without increased pain for improved function, mobility, balance, strength, and activity tolerance.    Time --   10-12   Period Weeks    Status New    Target Date 02/11/21      PT LONG TERM GOAL #7   Title Pt will report she is able to perform stairs at home safely without assistance to enter/exit home.    Time --   10-12   Period Weeks    Status New    Target Date 02/11/21      PT LONG TERM GOAL #8   Title Pt will be able to perform at least 7 consecutive sit to stands with  light UE assistance for improved functional LE strength and performance of daily transfers.    Time 10    Period Weeks    Status New    Target Date 01/28/21                    Plan - 11/19/20 1653     Clinical Impression Statement Pt has a Hx consisting of legionnairre's disease, dengue fever, anxiety, and balance issues.  Pt has anxiety and doesn't like to be around people.  Her pain has become more consistent the past 2 years and has significantly worsened in the past year.  She c/o's of a constant pain which is generalized over back, bilat UEs and LE, and L > R shoulder and knee.  Pt tried land  based PT from March to April and had continued high levels of pain without progress.  Pt saw MD and received orders for aquatic therapy.  Pt states sometimes she can't get out of bed due to tightness and pain. Pt is primarily ambulating at home and doesn't ambulate outside of home except going to MD.  Pt lives with her son and takes care of all the household chores and cleaning.  Pt is significantly limited with mobility including standing, ambulation, and transfers.  She typically requires assistance with going/up down 2 stairs to enter home.  Pt is a fall risk having balance deficits though her falls have decreased since using the walker.  Pt unable to perform a sit to stand transfer without assistance.  She ambulates with a FWW, has gait deficits, and is very limited with ambulation distance.  It required pt a signficantly increased amount of time to complete the TUG.  Pt has weakness in bilat LEs as evidenced by MMT.  Pt may benefit from skilled PT services to improve activity tolerance, strength, pain, function, and mobility.    Personal Factors and Comorbidities Time since onset of injury/illness/exacerbation;Comorbidity 3+    Comorbidities Fall Risk. Granuloma Annulare, major depressive disorder. Hx of legionnairre's disease and dengue fever, DM    Examination-Activity Limitations Bed Mobility;Bend;Dressing;Sit;Squat;Stairs;Stand;Locomotion Level;Transfers;Lift;Carry    Examination-Participation Restrictions Cleaning;Laundry    Stability/Clinical Decision Making Evolving/Moderate complexity    Clinical Decision Making Moderate    Rehab Potential Fair    PT Frequency 2x / week    PT Duration --   10-12 weeks   PT Treatment/Interventions ADLs/Self Care Home Management;Aquatic Therapy;Cryotherapy;Electrical Stimulation;Ultrasound;Moist Heat;Gait training;Stair training;Functional mobility training;Therapeutic activities;Therapeutic exercise;Balance training;Neuromuscular re-education;Manual  techniques;Patient/family education    PT Next Visit Plan Aquatic therapy.  Give ABC scale and Oswestry.    PT Home Exercise Plan Did not give land HEP.   Pt has already received land based PT and did not improve.  Pt was fatigued with land based eval.    Consulted and Agree with Plan of Care Patient             Patient will benefit from skilled therapeutic intervention in order to improve the following deficits and impairments:  Abnormal gait, Decreased activity tolerance, Decreased balance, Decreased mobility, Decreased endurance, Decreased range of motion, Decreased safety awareness, Decreased strength, Difficulty walking, Pain  Visit Diagnosis: Chronic low back pain, unspecified back pain laterality, unspecified whether sciatica present - Plan: PT plan of care cert/re-cert  Muscle weakness (generalized) - Plan: PT plan of care cert/re-cert  Difficulty in walking, not elsewhere classified - Plan: PT plan of care cert/re-cert  Chronic pain of both knees - Plan: PT plan of care cert/re-cert  Other lack of coordination - Plan: PT plan of care cert/re-cert  Repeated falls - Plan: PT plan of care cert/re-cert  Chronic left shoulder pain - Plan: PT plan of care cert/re-cert     Problem List Patient Active Problem List   Diagnosis Date Noted   Major depressive disorder, recurrent episode with anxious distress ([place]) 03/14/2020   Granuloma annulare 11/03/2019   History of dengue 11/03/2019   History of Legionnaire's disease 11/03/2019    Selinda Michaels III PT, DPT 11/19/20 11:20 PM   [facility] [address] [place], [place], [address] Phone: [phone]   Fax:  [phone]  Name: Karen Whitehead MRN: [id number] Date of Birth: 10-Apr-1966

## 2020-11-20 ENCOUNTER — Ambulatory Visit (INDEPENDENT_AMBULATORY_CARE_PROVIDER_SITE_OTHER): Payer: No Payment, Other | Admitting: Licensed Clinical Social Worker

## 2020-11-20 DIAGNOSIS — F418 Other specified anxiety disorders: Secondary | ICD-10-CM | POA: Diagnosis not present

## 2020-11-21 ENCOUNTER — Other Ambulatory Visit: Payer: Self-pay

## 2020-11-21 ENCOUNTER — Telehealth (HOSPITAL_COMMUNITY): Payer: Self-pay | Admitting: Psychiatry

## 2020-11-21 ENCOUNTER — Other Ambulatory Visit (HOSPITAL_COMMUNITY): Payer: Self-pay | Admitting: Psychiatry

## 2020-11-21 DIAGNOSIS — F339 Major depressive disorder, recurrent, unspecified: Secondary | ICD-10-CM

## 2020-11-21 MED ORDER — CLONAZEPAM 1 MG PO TABS: 1.0000 mg | ORAL_TABLET | Freq: Two times a day (BID) | ORAL | 1 refills | Status: DC | PRN

## 2020-11-21 MED ORDER — QUETIAPINE FUMARATE 200 MG PO TABS
200.0000 mg | ORAL_TABLET | Freq: Every day | ORAL | 1 refills | Status: DC
  Filled 2020-11-21: qty 30, 30d supply, fill #0

## 2020-11-21 MED ORDER — TRAZODONE HCL 100 MG PO TABS
ORAL_TABLET | Freq: Every day | ORAL | 1 refills | Status: DC
  Filled 2020-11-21: qty 60, fill #0
  Filled 2020-12-12: qty 60, 30d supply, fill #0

## 2020-11-21 MED ORDER — SERTRALINE HCL 100 MG PO TABS
ORAL_TABLET | Freq: Every day | ORAL | 1 refills | Status: DC
  Filled 2020-11-21: qty 60, 30d supply, fill #0
  Filled 2020-12-31: qty 60, 30d supply, fill #1

## 2020-11-21 NOTE — Telephone Encounter (Signed)
Patient called for REFILL:  SERTRALINE 100 MG & CLONAZEPAM 1 MG Pharmacy :  [facility].  [facility]- [facility] Patientt phone number: Last seen:  09/2020 COMMENTS:   [facility] patient New appt Ronne Binning 01/09/21

## 2020-11-21 NOTE — Telephone Encounter (Signed)
Medications refilled and sent to preferred pharmacies.

## 2020-11-21 NOTE — Progress Notes (Signed)
   THERAPIST PROGRESS NOTE  Virtual Visit via Video Note  I connected with Karen Whitehead on 11/20/20 at  2:00 PM EDT by a video enabled telemedicine application and verified that I am speaking with the correct person using two identifiers.  Location: Patient: Home Provider: [facility]   I discussed the limitations of evaluation and management by telemedicine and the availability of in person appointments. The patient expressed understanding and agreed to proceed. I discussed the assessment and treatment plan with the patient. The patient was provided an opportunity to ask questions and all were answered. The patient agreed with the plan and demonstrated an understanding of the instructions.  I provided 40 minutes of non-face-to-face time during this encounter.  Participation Level: Active  Behavioral Response: CasualAlertAnxious and Depressed  Type of Therapy: Individual Therapy  Treatment Goals addressed: Communication: anx/dep/coping  Interventions: Solution Focused and Supportive  Summary: Karen Whitehead is a 55 y.o. female who presents with hx of dep/anx. Pt last seen 09/05/20. Pt states she is managing overall but continues to struggle with S&S of dep/anx. Pt feels her episodes of panic are "not as bad". Pt reports she has been off of Zoloft for ~ 2 wks and is noting more dep with tearfulness. She states Dr. Worthy Rancher would not renew Zoloft. In exploration of Dr Starleen Arms notes it appears Dr [facility] is prescribing and pt missed last appt. Pt reports she could not pay phone bill and her phone was off. Pt not aware Dr. [facility] has left the practice and she does not have another appt for med management. Pt reports she has 9 of her Clonazepam left and is anxious about running out of this. Consulted with Dr. Ronne Binning. Pt provided info on walk ins for med management and states intent to come tomorrow morning. Pt reports Dr. Martha Clan is in pain management clinic and has given her new med for muscle spasms which  has been helping. Pt will be starting aquatic therapy next wk. She reports no new falls, continues to ambulate with walker. Son is doing well and he continues to work as a Programme researcher, broadcasting/film/video in [employer]. She states there is no new info on her disability claim and laments that she is unable to work. Pt denies other worries/concerns. LCSW reviewed poc including scheduling prior to close of session. Pt states appreciation for care.   Suicidal/Homicidal: Nowithout intent/plan  Therapist Response: Pt remains receptive to care.  Plan: Return again for next avail appt.  Diagnosis: Axis I:  depression with anxiety    Hermine Messick, LCSW 11/21/2020

## 2020-11-26 ENCOUNTER — Other Ambulatory Visit: Payer: Self-pay

## 2020-11-27 ENCOUNTER — Other Ambulatory Visit: Payer: Self-pay

## 2020-11-27 ENCOUNTER — Ambulatory Visit (HOSPITAL_BASED_OUTPATIENT_CLINIC_OR_DEPARTMENT_OTHER): Payer: Self-pay | Admitting: Physical Therapy

## 2020-11-27 DIAGNOSIS — R278 Other lack of coordination: Secondary | ICD-10-CM

## 2020-11-27 DIAGNOSIS — G8929 Other chronic pain: Secondary | ICD-10-CM

## 2020-11-27 DIAGNOSIS — M6281 Muscle weakness (generalized): Secondary | ICD-10-CM

## 2020-11-27 DIAGNOSIS — R262 Difficulty in walking, not elsewhere classified: Secondary | ICD-10-CM

## 2020-11-27 DIAGNOSIS — M545 Low back pain, unspecified: Secondary | ICD-10-CM

## 2020-11-27 DIAGNOSIS — M25561 Pain in right knee: Secondary | ICD-10-CM

## 2020-11-27 DIAGNOSIS — R296 Repeated falls: Secondary | ICD-10-CM
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
## 2020-11-29 ENCOUNTER — Ambulatory Visit (HOSPITAL_BASED_OUTPATIENT_CLINIC_OR_DEPARTMENT_OTHER): Payer: Self-pay | Admitting: Physical Therapy

## 2020-11-29 ENCOUNTER — Other Ambulatory Visit: Payer: Self-pay

## 2020-11-29 ENCOUNTER — Encounter (HOSPITAL_BASED_OUTPATIENT_CLINIC_OR_DEPARTMENT_OTHER): Payer: Self-pay | Admitting: Physical Therapy

## 2020-11-29 DIAGNOSIS — R262 Difficulty in walking, not elsewhere classified: Secondary | ICD-10-CM

## 2020-11-29 DIAGNOSIS — R296 Repeated falls: Secondary | ICD-10-CM

## 2020-11-29 DIAGNOSIS — M6281 Muscle weakness (generalized): Secondary | ICD-10-CM

## 2020-11-29 DIAGNOSIS — M545 Low back pain, unspecified: Secondary | ICD-10-CM

## 2020-11-29 DIAGNOSIS — G8929 Other chronic pain: Secondary | ICD-10-CM

## 2020-11-29 DIAGNOSIS — R278 Other lack of coordination: Secondary | ICD-10-CM

## 2020-11-29 DIAGNOSIS — M25562 Pain in left knee: Secondary | ICD-10-CM

## 2020-11-29 NOTE — Therapy (Signed)
[facility] [place], [place], [address] Phone: [phone]   Fax:  [phone]  Physical Therapy Treatment  Patient Details  Name: NANCEE KARAHALIOS MRN: [id number] Date of Birth: [birth year] Referring Provider (PT): Dr. Charlott Rakes   Encounter Date: 11/29/2020   PT End of Session - 11/29/20 1416     Visit Number 2    Number of Visits 24    Date for PT Re-Evaluation 12/17/20    PT Start Time 1007    PT Stop Time 1048    PT Time Calculation (min) 41 min    Equipment Utilized During Treatment Other (comment)   thick (yellow) pool noodle   Activity Tolerance Other (comment)   Limited by mobility deficits   Behavior During Therapy [facility] for tasks assessed/performed             Past Medical History:  Diagnosis Date   Granuloma annulare     Past Surgical History:  Procedure Laterality Date   APPENDECTOMY      There were no vitals filed for this visit.   Subjective Assessment - 11/29/20 1411     Subjective Pt states she is not doing well today.  She is having issues with her L LE including numbness.  Pt reports her son had to get help get her OOB this AM.  Pt's son takes care of most things at home.  Pt has significant limitations with ambulation and is primarily ambulating at home.  Pt is significatnly limited with mobility and had difficulty and limitations with standing, ambulation, and transfers.  Pt has had 8-10 falls in the past 6 months and 2 falls in the past month.    Pertinent History Fall Risk.  Granuloma Annulare, major depressive disoder, legionnairre's disease, dengue fever, DM, memory loss, anxiety.    Limitations Sitting;Walking;Standing;House hold activities    How long can you stand comfortably? 5-10 mins    How long can you walk comfortably? just ambulating in home.    Diagnostic tests Brain MRI:  IMPRESSION:  No acute or reversible finding. Generalized brain volume loss, more  than usually  seen at this age. Few scattered punctate foci T2 and  FLAIR signal in the hemispheric white matter consistent mild chronic  small vessel change. per MD note.  Pt's hip and knee x ray are negative.    Currently in Pain? Yes    Pain Score 10-Worst pain ever    Pain Location --   Lumbar, L leg, L UE   Effect of Pain on Daily Activities severely limited with activity, ambulation, transfers, and standing duration, Pt unable to perform household chores.  Balance. Diffculty with bed mobility.            Pt seen for aquatic therapy today.  Treatment took place in water 3.5-4 ft in depth at the [facility]. Temp of water was 91.  Pt entered/exited the pool via stairs with a step to gait with bilat rails with instruction in correct sequencing and PT assistance including min to mod when exiting the pool.   Pt requires buoyancy for support and to offload joints with strengthening exercises. Viscosity of the water is needed for resistance of strengthening; water current perturbations provides challenge to standing balance unsupported, requiring increased core activation. Introduction to water.  -Pt ambulated 1.5 laps with yellow noodle with cuing to keep noodle close and to advance L LE.  Pt required min assist occasionally with ambulation.    -Pt  performed:     -seated marching 2 x 10 reps.     -seated heel raises 2x10 reps     -seated LAQ 2x10 on R.  Pt attempted on L though had limited ROM and pain      -seated bicycles 2x10 reps       PT Education - 11/29/20 1420     Education Details Educated pt in the benefits of aquatic therapy including buoyancy and viscosity.  instructed pt in proper sequencing to perform stairs.  Instructed pt in correct form with exercises.  instructed pt in keeping the pool noodle close with ambulation.    Person(s) Educated Patient    Methods Explanation;Demonstration;Verbal cues    Comprehension Verbalized understanding;Returned demonstration;Verbal  cues required              PT Short Term Goals - 11/19/20 2157       PT SHORT TERM GOAL #1   Title Pt will be able to perform sit to stand transfers safely and independently.    Baseline requires min assist    Time 3    Period Weeks    Status New    Target Date 12/10/20      PT SHORT TERM GOAL #2   Title Pt will report at least a 25% improvement overall in pain and mobility.    Time 4    Period Weeks    Status New    Target Date 12/17/20      PT SHORT TERM GOAL #3   Title Pt will ambulate at least 100 ft with improved gait speed and correct usage of walker.    Baseline Pt had difficulty including L LE dragging with limited foot clearance after ambulating 15 ft    Time 4    Period Weeks    Target Date 12/17/20      PT SHORT TERM GOAL #4   Title Pt will  improve TUG time to no > 62mn and 20 seconds with walker.    Baseline 264m 40 seconds    Time 5    Period Weeks    Status New    Target Date 12/24/20      PT SHORT TERM GOAL #5   Title Pt will demo improved LE strength to 4- to 4/5 MMT in hip flexion, knee flex and extension, and DF bilat for improved mobility, ambulation, and tolerance to activity.    Baseline see objective findings in IE    Time 6    Period Weeks    Status New    Target Date 12/31/20      Additional Short Term Goals   Additional Short Term Goals Yes      PT SHORT TERM GOAL #6   Title Pt will report improved standing duration at home to improve performance of self care activities and light IADLs.    Time 5    Period Weeks    Status New    Target Date 12/24/20               PT Long Term Goals - 11/19/20 2220       PT LONG TERM GOAL #1   Title Pt will ambulate at least 200 ft with walker with no > than SBA/CGA without having to sit down    Time 8    Period Weeks    Status New    Target Date 01/14/21      PT LONG TERM GOAL #2   Title Pt will  tolerate aquatic therapy without significant increased pain in order to establish an  exercise routine to improve functional mobility and tolerance to activity.    Time 8    Period Weeks    Status New    Target Date 01/14/21      PT LONG TERM GOAL #3   Title Pt will be able to perform TUG in no > than 20 sec for improved mobility and gait speed.    Time --   10-12   Period Weeks    Status New    Target Date 02/11/21      PT LONG TERM GOAL #4   Title Pt will be able to ambulate community distance with walker without significant pain or difficulty.    Time --   10-12   Period Weeks    Target Date 02/11/21      PT LONG TERM GOAL #5   Title Pt will report she is able to perform bed mobility, self care activities, and some light IADLs in her home without signifiant back and LE pain.    Time --   10-12   Period Weeks    Target Date 02/11/21      Additional Long Term Goals   Additional Long Term Goals Yes      PT LONG TERM GOAL #6   Title Pt will be able to perform aquatic program with appropriate assistance without increased pain for improved function, mobility, balance, strength, and activity tolerance.    Time --   10-12   Period Weeks    Status New    Target Date 02/11/21      PT LONG TERM GOAL #7   Title Pt will report she is able to perform stairs at home safely without assistance to enter/exit home.    Time --   10-12   Period Weeks    Status New    Target Date 02/11/21      PT LONG TERM GOAL #8   Title Pt will be able to perform at least 7 consecutive sit to stands with light UE assistance for improved functional LE strength and performance of daily transfers.    Time 10    Period Weeks    Status New    Target Date 01/28/21                   Plan - 11/29/20 1418     Clinical Impression Statement Last time pt was here, she was able to ambulate all the way to the pool with walker, but today she had to use the W/C to make it back to the pool.  Pt presents to Rx c/o'ing of pain and numbness in L LE.  She states her son had to help her get  OOB.  Pt required instruction to perform stairs with correct sequencing to enter and exit the pool.  Pt was very slow with performing stairs and required assistance from PT including min to mod when exiting the pool.  Pt had much difficulty lifting L LE up the stairs at the end of Rx.  Pt required assistance from PT with ambulation for stability and proper distance from the noodle occasionally.  Pt had much difficulty advancing L LE with gait and had times in the pool when she stood still not taking a step with L LE.  pt was very slow with gait in the pool.  Seated pool exercises were primarily peformed due to pt's activity tolerance, mobility, pain, and  weakness.  Pt unable to perform seated L LAQ and had pain.  Pt had limited ROM on L knee with seated bicycles.  PT positioned the W/C right beside the rails and pool entrance.  She required assistance with transfers and mobility.  Pt had low tolerance for mobility and exercises today    Personal Factors and Comorbidities Time since onset of injury/illness/exacerbation;Comorbidity 3+    Comorbidities Fall Risk. Granuloma Annulare, major depressive disorder. Hx of legionnairre's disease and dengue fever, DM    Examination-Activity Limitations Bed Mobility;Bend;Dressing;Sit;Squat;Stairs;Stand;Locomotion Level;Transfers;Lift;Carry    Examination-Participation Restrictions Cleaning;Laundry    PT Treatment/Interventions ADLs/Self Care Home Management;Aquatic Therapy;Cryotherapy;Electrical Stimulation;Ultrasound;Moist Heat;Gait training;Stair training;Functional mobility training;Therapeutic activities;Therapeutic exercise;Balance training;Neuromuscular re-education;Manual techniques;Patient/family education    PT Next Visit Plan Pt requires assistance with transfers, mobility, and ambulation/exercises in the pool.  Cont with aquatic therapy.  May have to use chair lift if pt unable to perform stairs well.    PT Home Exercise Plan Pt has already received land based  PT and did not improve.  Will give HEP when pt is able to tolerate.    Consulted and Agree with Plan of Care Patient             Patient will benefit from skilled therapeutic intervention in order to improve the following deficits and impairments:  Abnormal gait, Decreased activity tolerance, Decreased balance, Decreased mobility, Decreased endurance, Decreased range of motion, Decreased safety awareness, Decreased strength, Difficulty walking, Pain  Visit Diagnosis: Chronic low back pain, unspecified back pain laterality, unspecified whether sciatica present  Muscle weakness (generalized)  Difficulty in walking, not elsewhere classified  Chronic pain of both knees  Other lack of coordination  Repeated falls     Problem List Patient Active Problem List   Diagnosis Date Noted   Major depressive disorder, recurrent episode with anxious distress ([place]) 03/14/2020   Granuloma annulare 11/03/2019   History of dengue 11/03/2019   History of Legionnaire's disease 11/03/2019    Selinda Michaels III PT, DPT 11/29/20 5:22 PM   [facility] [address] [place], [place], [address] Phone: [phone]   Fax:  [phone]  Name: LIRIO YEAGLE MRN: [id number] Date of Birth: June 29, 1965

## 2020-12-04 ENCOUNTER — Other Ambulatory Visit: Payer: Self-pay

## 2020-12-07 ENCOUNTER — Other Ambulatory Visit: Payer: Self-pay

## 2020-12-07 ENCOUNTER — Ambulatory Visit (HOSPITAL_BASED_OUTPATIENT_CLINIC_OR_DEPARTMENT_OTHER): Payer: Self-pay | Attending: Family Medicine | Admitting: Physical Therapy

## 2020-12-07 ENCOUNTER — Encounter (HOSPITAL_BASED_OUTPATIENT_CLINIC_OR_DEPARTMENT_OTHER): Payer: Self-pay | Admitting: Physical Therapy

## 2020-12-07 DIAGNOSIS — M25561 Pain in right knee: Secondary | ICD-10-CM | POA: Insufficient documentation

## 2020-12-07 DIAGNOSIS — M25562 Pain in left knee: Secondary | ICD-10-CM | POA: Insufficient documentation

## 2020-12-07 DIAGNOSIS — M6281 Muscle weakness (generalized): Secondary | ICD-10-CM

## 2020-12-07 DIAGNOSIS — M545 Low back pain, unspecified: Secondary | ICD-10-CM

## 2020-12-07 DIAGNOSIS — G8929 Other chronic pain: Secondary | ICD-10-CM

## 2020-12-07 DIAGNOSIS — R278 Other lack of coordination: Secondary | ICD-10-CM

## 2020-12-07 DIAGNOSIS — R262 Difficulty in walking, not elsewhere classified: Secondary | ICD-10-CM

## 2020-12-07 DIAGNOSIS — R296 Repeated falls: Secondary | ICD-10-CM

## 2020-12-07 NOTE — Therapy (Signed)
[facility] [place], [place], [address] Phone: [phone]   Fax:  [phone]  Physical Therapy Treatment  Patient Details  Name: Karen Whitehead MRN: [id number] Date of Birth: Sep 07, 1965 Referring Provider (PT): Dr. Charlott Rakes   Encounter Date: 12/07/2020   PT End of Session - 12/07/20 1536     Visit Number 3    Number of Visits 24    Date for PT Re-Evaluation 12/17/20    PT Start Time 1151    PT Stop Time 1235    PT Time Calculation (min) 44 min    Equipment Utilized During Treatment Other (comment)   thick (yellow) noodle, ankle floats, neck float, squoodle   Activity Tolerance Other (comment)   limited by mobility deficits.   Behavior During Therapy [facility] for tasks assessed/performed             Past Medical History:  Diagnosis Date   Granuloma annulare     Past Surgical History:  Procedure Laterality Date   APPENDECTOMY      There were no vitals filed for this visit.   Subjective Assessment - 12/07/20 1157     Subjective Pt states she hurts all the time.  She c/o's of pain all over.  Pt states she had significant difficulty getting OOB this AM.  Pt had a fall onto he side yesterday trying to make her bed.  She reports no increased pain from the fall.  Pt denies any adverse effects after prior Rx.  Pt is significantly limited with mobility, ambulation, and standing activities.  Pt sees neurologist on 7/19.    Pertinent History Fall Risk.  Granuloma Annulare, major depressive disoder, legionnairre's disease, dengue fever, DM, memory loss, anxiety.    Diagnostic tests Brain MRI:  IMPRESSION:  No acute or reversible finding. Generalized brain volume loss, more  than usually seen at this age. Few scattered punctate foci T2 and  FLAIR signal in the hemispheric white matter consistent mild chronic  small vessel change. per MD note.  Pt's hip and knee x ray are negative.    Currently in Pain? Yes    Pain  Score --   7-8/10   Pain Location --   bilat LEs and UEs              Pt seen for aquatic therapy today.  Treatment took place in water 3.5-4 ft in depth at the [facility]. Temp of water was 92.  Pt entered/exited the pool via stairs with a step to gait with bilat rails with instruction in correct sequencing and PT assistance including CGA when exiting the pool.   Pt requires buoyancy for support and to offload joints with strengthening exercises. Viscosity of the water is needed for resistance of strengthening; water current perturbations provides challenge to standing balance unsupported, requiring increased core activation. -Pt ambulated 1.5 laps with yellow noodle with cuing to advance L LE.      -Pt performed:     -seated marching 2 x 10 reps.     -seated hip abd/add 2x10 reps     -seated heel raises 2x10 reps     -seated LAQ 2x10 bilat.  L LE had limited ROM       -seated bicycles 2x10 rep      -seated without back support or feet on floor for improved core strength and activation 3x30 sec      -floating with neck float, squoodles, and ankle floats to reduce  muscle tension, unweight body, promote relaxation, and improve pain.         PT Education - 12/07/20 1531     Education Details Educated pt in the benefits of aquatic therapy including buoyancy and viscosity.  Instructed pt in correct form with exercises.    Person(s) Educated Patient    Methods Explanation;Demonstration;Verbal cues    Comprehension Verbalized understanding;Returned demonstration              PT Short Term Goals - 11/19/20 2157       PT SHORT TERM GOAL #1   Title Pt will be able to perform sit to stand transfers safely and independently.    Baseline requires min assist    Time 3    Period Weeks    Status New    Target Date 12/10/20      PT SHORT TERM GOAL #2   Title Pt will report at least a 25% improvement overall in pain and mobility.    Time 4    Period Weeks     Status New    Target Date 12/17/20      PT SHORT TERM GOAL #3   Title Pt will ambulate at least 100 ft with improved gait speed and correct usage of walker.    Baseline Pt had difficulty including L LE dragging with limited foot clearance after ambulating 15 ft    Time 4    Period Weeks    Target Date 12/17/20      PT SHORT TERM GOAL #4   Title Pt will  improve TUG time to no > 7mn and 20 seconds with walker.    Baseline 263m 40 seconds    Time 5    Period Weeks    Status New    Target Date 12/24/20      PT SHORT TERM GOAL #5   Title Pt will demo improved LE strength to 4- to 4/5 MMT in hip flexion, knee flex and extension, and DF bilat for improved mobility, ambulation, and tolerance to activity.    Baseline see objective findings in IE    Time 6    Period Weeks    Status New    Target Date 12/31/20      Additional Short Term Goals   Additional Short Term Goals Yes      PT SHORT TERM GOAL #6   Title Pt will report improved standing duration at home to improve performance of self care activities and light IADLs.    Time 5    Period Weeks    Status New    Target Date 12/24/20               PT Long Term Goals - 11/19/20 2220       PT LONG TERM GOAL #1   Title Pt will ambulate at least 200 ft with walker with no > than SBA/CGA without having to sit down    Time 8    Period Weeks    Status New    Target Date 01/14/21      PT LONG TERM GOAL #2   Title Pt will tolerate aquatic therapy without significant increased pain in order to establish an exercise routine to improve functional mobility and tolerance to activity.    Time 8    Period Weeks    Status New    Target Date 01/14/21      PT LONG TERM GOAL #3   Title Pt will be able  to perform TUG in no > than 20 sec for improved mobility and gait speed.    Time --   10-12   Period Weeks    Status New    Target Date 02/11/21      PT LONG TERM GOAL #4   Title Pt will be able to ambulate community distance  with walker without significant pain or difficulty.    Time --   10-12   Period Weeks    Target Date 02/11/21      PT LONG TERM GOAL #5   Title Pt will report she is able to perform bed mobility, self care activities, and some light IADLs in her home without signifiant back and LE pain.    Time --   10-12   Period Weeks    Target Date 02/11/21      Additional Long Term Goals   Additional Long Term Goals Yes      PT LONG TERM GOAL #6   Title Pt will be able to perform aquatic program with appropriate assistance without increased pain for improved function, mobility, balance, strength, and activity tolerance.    Time --   10-12   Period Weeks    Status New    Target Date 02/11/21      PT LONG TERM GOAL #7   Title Pt will report she is able to perform stairs at home safely without assistance to enter/exit home.    Time --   10-12   Period Weeks    Status New    Target Date 02/11/21      PT LONG TERM GOAL #8   Title Pt will be able to perform at least 7 consecutive sit to stands with light UE assistance for improved functional LE strength and performance of daily transfers.    Time 10    Period Weeks    Status New    Target Date 01/28/21                   Plan - 12/07/20 1159     Clinical Impression Statement Pt arrives in W/C today.  Pt c/o's of constant pain and reports having generalized pain.  She has significant deficits in functional mobility including bed mobility, ambulation, and standing.  Pt is very limited with aquatic exercises due to limited mobility and strength deficits.  She is very slow with ambulation and movement in general in the pool.  Pt has limited ROM and movement with L LE in the pool and c/o's of increased L knee pain while in the pool.  Pt uses a noodle for assistance with ambulation and PT provided supervision.  She is limited with distance and it takes her significantly increased time to walk the width of the pool.  She is delayed with her  response to begin stepping and has occasional difficulty advancing L LE. Pt has low tolerance with aquatic exercises and seemed to like floating.  Pt was able to perform stairs with rails better today and received CGA at the top of the stairs when exiting the pool.  pt was very appreciative of PT.    Comorbidities Fall Risk. Granuloma Annulare, major depressive disorder. Hx of legionnairre's disease and dengue fever, DM    PT Treatment/Interventions ADLs/Self Care Home Management;Aquatic Therapy;Cryotherapy;Electrical Stimulation;Ultrasound;Moist Heat;Gait training;Stair training;Functional mobility training;Therapeutic activities;Therapeutic exercise;Balance training;Neuromuscular re-education;Manual techniques;Patient/family education    PT Next Visit Plan Pt requires assistance with transfers, mobility, and ambulation/exercises in the pool.  Cont with aquatic  therapy.    PT Home Exercise Plan Pt has already received land based PT and did not improve.  Will give HEP when pt is able to tolerate.    Consulted and Agree with Plan of Care Patient             Patient will benefit from skilled therapeutic intervention in order to improve the following deficits and impairments:  Abnormal gait, Decreased activity tolerance, Decreased balance, Decreased mobility, Decreased endurance, Decreased range of motion, Decreased safety awareness, Decreased strength, Difficulty walking, Pain  Visit Diagnosis: Chronic low back pain, unspecified back pain laterality, unspecified whether sciatica present  Muscle weakness (generalized)  Difficulty in walking, not elsewhere classified  Chronic pain of both knees  Other lack of coordination  Repeated falls     Problem List Patient Active Problem List   Diagnosis Date Noted   Major depressive disorder, recurrent episode with anxious distress ([place]) 03/14/2020   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

## 2020-12-11 ENCOUNTER — Ambulatory Visit (HOSPITAL_BASED_OUTPATIENT_CLINIC_OR_DEPARTMENT_OTHER): Payer: Self-pay | Admitting: Physical Therapy

## 2020-12-12 ENCOUNTER — Telehealth: Payer: Self-pay | Admitting: Family Medicine

## 2020-12-12 ENCOUNTER — Other Ambulatory Visit: Payer: Self-pay

## 2020-12-12 ENCOUNTER — Ambulatory Visit: Payer: Self-pay | Attending: Family Medicine

## 2020-12-12 NOTE — Telephone Encounter (Signed)
Copied from [facility] [id number]. Topic: General - Other >> Dec 07, 2020 11:08 AM Leward Quan A wrote: Reason for CRM: Patient called in to say that her [facility] has expired and need to stop in and pick up a pamphlet today say that she need to have a 30 day temp card from [facility] for an appointment she have on 12/10/20. Please call with questions  Ph# [phone]

## 2020-12-12 NOTE — Telephone Encounter (Signed)
Already spoke with the Pt

## 2020-12-13 ENCOUNTER — Encounter (HOSPITAL_BASED_OUTPATIENT_CLINIC_OR_DEPARTMENT_OTHER): Payer: Self-pay | Admitting: Physical Therapy

## 2020-12-13 ENCOUNTER — Ambulatory Visit (HOSPITAL_BASED_OUTPATIENT_CLINIC_OR_DEPARTMENT_OTHER): Payer: Self-pay | Admitting: Physical Therapy

## 2020-12-13 DIAGNOSIS — R278 Other lack of coordination: Secondary | ICD-10-CM

## 2020-12-13 DIAGNOSIS — G8929 Other chronic pain: Secondary | ICD-10-CM

## 2020-12-13 DIAGNOSIS — M6281 Muscle weakness (generalized): Secondary | ICD-10-CM

## 2020-12-13 DIAGNOSIS — R262 Difficulty in walking, not elsewhere classified: Secondary | ICD-10-CM

## 2020-12-13 DIAGNOSIS — M25561 Pain in right knee: Secondary | ICD-10-CM

## 2020-12-13 DIAGNOSIS — R296 Repeated falls: Secondary | ICD-10-CM

## 2020-12-13 NOTE — Therapy (Signed)
[place], [place], [address] Phone: [phone]   Fax:  [phone]  Physical Therapy Treatment  Patient Details  Name: Karen Whitehead MRN: [id number] Date of Birth: [birth year] Referring Provider (PT): Dr. Charlott Rakes   Encounter Date: 12/13/2020   PT End of Session - 12/13/20 1023     Visit Number 4    Number of Visits 24    Date for PT Re-Evaluation 12/17/20    PT Start Time 1019    PT Stop Time 1104    PT Time Calculation (min) 45 min    Equipment Utilized During Treatment Other (comment)   water walker.  gait belt   Activity Tolerance Other (comment)   limited by mobility deficits and weakness   Behavior During Therapy [facility] for tasks assessed/performed             Past Medical History:  Diagnosis Date   Granuloma annulare     Past Surgical History:  Procedure Laterality Date   APPENDECTOMY      There were no vitals filed for this visit.   Subjective Assessment - 12/13/20 1020     Subjective "Not so good today".  pt reports she was unable to move her legs on Tuesday and cancelled her PT appt.  She couldn't get OOB and didn't get OOB until the evening with her son's help.  pt used patches on her LEs which helped.  Pt states she felt a lot better yesterday.  Pt states she feels better in the water and it helps her relax.  Pt states she had a massive panic attack the evening after prior Rx.  Pt reports no increased pain after Rx but was fatigued.  Pt c/o's of constant pain.  Pt states she had throbbing pain along L distal forearm/wrist/thumb the day before.  Pt tried to stand for 2 mins to wash the dishes but was unable to wash dishes due to significant back pain.  Pt states she usually requires assistance from son to dress.    Pertinent History Fall Risk.  Granuloma Annulare, major depressive disoder, legionnairre's disease, dengue fever, DM, memory loss, anxiety.    Diagnostic tests Brain  MRI:  IMPRESSION:  No acute or reversible finding. Generalized brain volume loss, more  than usually seen at this age. Few scattered punctate foci T2 and  FLAIR signal in the hemispheric white matter consistent mild chronic  small vessel change. per MD note.  Pt's hip and knee x ray are negative.    Currently in Pain? Yes    Pain Score 8     Pain Location --   "all over".  L > R LE, back, bilat UEs.   Pain Descriptors / Indicators Constant    Effect of Pain on Daily Activities severely limited with activity, ambulation, transfers, and standing duration.  Household chores             Pt seen for aquatic therapy today.  Treatment took place in water 3.5-4 ft in depth at the [facility]. Temp of water was 94.  Pt entered/exited the pool via stairs with a step to gait with bilat rails with instruction in correct sequencing and min assistance descending stairs entering the pool and moderate assistance ascending stairs when exiting the pool.  PT used a gait belt when ascending stairs.   Pt requires buoyancy for support and to offload joints with strengthening exercises. Viscosity of the water is needed for resistance of  strengthening; water current perturbations provides challenge to standing balance unsupported, requiring increased core activation.  -Pt ambulated 2 laps with water walker with SBA and occasional min assist with cuing to advance LEs.      -Pt performed:     -seated marching 2 x 10 reps.     -seated hip abd/add 2x10 reps     -seated heel raises 2x10 reps     -seated LAQ 1x10 bilat.  L LE had limited ROM       -seated bicycles 2x10 rep      -seated without back support or feet on floor for improved core strength and activation 3x30 sec       PT Education - 12/13/20 1419     Education Details Educated pt in the benefits of aquatic therapy.  instructed pt in correct form with exercises.  Educated pt with how to correctly perform sit/stand transfers and in proper  stair training.    Person(s) Educated Patient    Methods Explanation;Demonstration;Verbal cues    Comprehension Returned demonstration;Verbalized understanding              PT Short Term Goals - 11/19/20 2157       PT SHORT TERM GOAL #1   Title Pt will be able to perform sit to stand transfers safely and independently.    Baseline requires min assist    Time 3    Period Weeks    Status New    Target Date 12/10/20      PT SHORT TERM GOAL #2   Title Pt will report at least a 25% improvement overall in pain and mobility.    Time 4    Period Weeks    Status New    Target Date 12/17/20      PT SHORT TERM GOAL #3   Title Pt will ambulate at least 100 ft with improved gait speed and correct usage of walker.    Baseline Pt had difficulty including L LE dragging with limited foot clearance after ambulating 15 ft    Time 4    Period Weeks    Target Date 12/17/20      PT SHORT TERM GOAL #4   Title Pt will  improve TUG time to no > 68mn and 20 seconds with walker.    Baseline 262m 40 seconds    Time 5    Period Weeks    Status New    Target Date 12/24/20      PT SHORT TERM GOAL #5   Title Pt will demo improved LE strength to 4- to 4/5 MMT in hip flexion, knee flex and extension, and DF bilat for improved mobility, ambulation, and tolerance to activity.    Baseline see objective findings in IE    Time 6    Period Weeks    Status New    Target Date 12/31/20      Additional Short Term Goals   Additional Short Term Goals Yes      PT SHORT TERM GOAL #6   Title Pt will report improved standing duration at home to improve performance of self care activities and light IADLs.    Time 5    Period Weeks    Status New    Target Date 12/24/20               PT Long Term Goals - 11/19/20 2220       PT LONG TERM GOAL #1   Title Pt will  ambulate at least 200 ft with walker with no > than SBA/CGA without having to sit down    Time 8    Period Weeks    Status New     Target Date 01/14/21      PT LONG TERM GOAL #2   Title Pt will tolerate aquatic therapy without significant increased pain in order to establish an exercise routine to improve functional mobility and tolerance to activity.    Time 8    Period Weeks    Status New    Target Date 01/14/21      PT LONG TERM GOAL #3   Title Pt will be able to perform TUG in no > than 20 sec for improved mobility and gait speed.    Time --   10-12   Period Weeks    Status New    Target Date 02/11/21      PT LONG TERM GOAL #4   Title Pt will be able to ambulate community distance with walker without significant pain or difficulty.    Time --   10-12   Period Weeks    Target Date 02/11/21      PT LONG TERM GOAL #5   Title Pt will report she is able to perform bed mobility, self care activities, and some light IADLs in her home without signifiant back and LE pain.    Time --   10-12   Period Weeks    Target Date 02/11/21      Additional Long Term Goals   Additional Long Term Goals Yes      PT LONG TERM GOAL #6   Title Pt will be able to perform aquatic program with appropriate assistance without increased pain for improved function, mobility, balance, strength, and activity tolerance.    Time --   10-12   Period Weeks    Status New    Target Date 02/11/21      PT LONG TERM GOAL #7   Title Pt will report she is able to perform stairs at home safely without assistance to enter/exit home.    Time --   10-12   Period Weeks    Status New    Target Date 02/11/21      PT LONG TERM GOAL #8   Title Pt will be able to perform at least 7 consecutive sit to stands with light UE assistance for improved functional LE strength and performance of daily transfers.    Time 10    Period Weeks    Status New    Target Date 01/28/21                   Plan - 12/13/20 1402     Clinical Impression Statement Pt arrived to the pool in W/C.  Pt states she had to cancel her appt earlier this week due to  having a bad and being unable to move her legs.  Pt typically requires assistance from son to get 6 and dress.  Pt c/o's of constant pain and states she has pain all over.  She has significant deficits in functional mobility including bed mobility, ambulation, and standing.  Pt required increased assistance with mobility today and was slower having increased difficulty with peforming to steps to enter/exit pool.  Pt required moderate assistance to perform sit to stand transfers and min assist to descend stairs prior to Rx and mod assist to ascend stairs after Rx.  Pt used a water walker during  Rx today and had improved ability to ambulate.  She was able to ambulate a little further today and had improved balance and leg advancement while using water walker.  Pt still was very slow moving in water including ambulating and required cuing to advance LE at times.  Pt is limited and has low tolerance with aquatic exercises.  Pt states she felt a little worse after Rx.  Pt was very appreciative of PT.  Pt may benefit from cont skilled PT services to address goals, improve functional endurance and tolerance to activity, increase strength, and maximize functional mobility.    Comorbidities Fall Risk. Granuloma Annulare, major depressive disorder. Hx of legionnairre's disease and dengue fever, DM    PT Treatment/Interventions ADLs/Self Care Home Management;Aquatic Therapy;Cryotherapy;Electrical Stimulation;Ultrasound;Moist Heat;Gait training;Stair training;Functional mobility training;Therapeutic activities;Therapeutic exercise;Balance training;Neuromuscular re-education;Manual techniques;Patient/family education    PT Next Visit Plan Pt requires assistance with transfers, mobility, and ambulation/exercises in the pool.  Cont with aquatic therapy.  Cont using the water walker with ambulation in the pool.    PT Home Exercise Plan Pt has already received land based PT and did not improve.  Will give HEP when pt is able  to tolerate.    Consulted and Agree with Plan of Care Patient             Patient will benefit from skilled therapeutic intervention in order to improve the following deficits and impairments:  Abnormal gait, Decreased activity tolerance, Decreased balance, Decreased mobility, Decreased endurance, Decreased range of motion, Decreased safety awareness, Decreased strength, Difficulty walking, Pain  Visit Diagnosis: Chronic low back pain, unspecified back pain laterality, unspecified whether sciatica present  Muscle weakness (generalized)  Difficulty in walking, not elsewhere classified  Chronic pain of both knees  Other lack of coordination  Repeated falls     Problem List Patient Active Problem List   Diagnosis Date Noted   Major depressive disorder, recurrent episode with anxious distress ([place]) 03/14/2020   Granuloma annulare 11/03/2019   History of dengue 11/03/2019   History of Legionnaire's disease 11/03/2019    Selinda Michaels III PT, DPT 12/13/20 2:25 PM   [facility] [address] [place], [place], [address] Phone: [phone]   Fax:  [phone]  Name: Karen Whitehead MRN: [id number] Date of Birth: [birth year]

## 2020-12-17 ENCOUNTER — Ambulatory Visit: Payer: Self-pay | Attending: Family Medicine | Admitting: Family Medicine

## 2020-12-17 ENCOUNTER — Encounter
Payer: No Typology Code available for payment source | Attending: Physical Medicine and Rehabilitation | Admitting: Physical Medicine and Rehabilitation

## 2020-12-17 ENCOUNTER — Other Ambulatory Visit: Payer: Self-pay

## 2020-12-17 ENCOUNTER — Other Ambulatory Visit (HOSPITAL_COMMUNITY)
Admission: RE | Admit: 2020-12-17 | Discharge: 2020-12-17 | Disposition: A | Discharge status: Home / Self Care | Payer: No Typology Code available for payment source | Source: Ambulatory Visit | Attending: Family Medicine | Admitting: Family Medicine

## 2020-12-17 VITALS — BP 130/88 | HR 86 | Ht 64.0 in | Wt 227.8 lb

## 2020-12-17 VITALS — BP 130/82 | HR 85 | Temp 98.5°F | Ht 64.0 in | Wt 226.0 lb

## 2020-12-17 DIAGNOSIS — Z1211 Encounter for screening for malignant neoplasm of colon: Secondary | ICD-10-CM

## 2020-12-17 DIAGNOSIS — Z124 Encounter for screening for malignant neoplasm of cervix: Secondary | ICD-10-CM

## 2020-12-17 DIAGNOSIS — G894 Chronic pain syndrome: Secondary | ICD-10-CM | POA: Insufficient documentation

## 2020-12-17 DIAGNOSIS — Z1231 Encounter for screening mammogram for malignant neoplasm of breast: Secondary | ICD-10-CM

## 2020-12-17 DIAGNOSIS — Z0000 Encounter for general adult medical examination without abnormal findings: Secondary | ICD-10-CM

## 2020-12-17 DIAGNOSIS — N3941 Urge incontinence: Secondary | ICD-10-CM

## 2020-12-17 DIAGNOSIS — M791 Myalgia, unspecified site: Secondary | ICD-10-CM | POA: Insufficient documentation

## 2020-12-17 DIAGNOSIS — N841 Polyp of cervix uteri: Secondary | ICD-10-CM

## 2020-12-17 DIAGNOSIS — G4701 Insomnia due to medical condition: Secondary | ICD-10-CM | POA: Insufficient documentation

## 2020-12-17 MED ORDER — LIDOCAINE 5 % EX PTCH
2.0000 | MEDICATED_PATCH | CUTANEOUS | 6 refills | Status: DC
  Filled 2020-12-17: qty 30, 15d supply, fill #0
  Filled 2021-03-06 – 2021-03-14 (×2): qty 30, 15d supply, fill #1
  Filled 2021-04-11: qty 30, 15d supply, fill #2

## 2020-12-17 NOTE — Patient Instructions (Signed)
Health Maintenance, Female Adopting a healthy lifestyle and getting preventive care are important in promoting health and wellness. Ask your health care provider about: The right schedule for you to have regular tests and exams. Things you can do on your own to prevent diseases and keep yourself healthy. What should I know about diet, weight, and exercise? Eat a healthy diet  Eat a diet that includes plenty of vegetables, fruits, low-fat dairy products, and lean protein. Do not eat a lot of foods that are high in solid fats, added sugars, or sodium.  Maintain a healthy weight Body mass index (BMI) is used to identify weight problems. It estimates body fat based on height and weight. Your health care provider can help determineyour BMI and help you achieve or maintain a healthy weight. Get regular exercise Get regular exercise. This is one of the most important things you can do for your health. Most adults should: Exercise for at least 150 minutes each week. The exercise should increase your heart rate and make you sweat (moderate-intensity exercise). Do strengthening exercises at least twice a week. This is in addition to the moderate-intensity exercise. Spend less time sitting. Even light physical activity can be beneficial. Watch cholesterol and blood lipids Have your blood tested for lipids and cholesterol at 54 years of age, then havethis test every 5 years. Have your cholesterol levels checked more often if: Your lipid or cholesterol levels are high. You are older than 54 years of age. You are at high risk for heart disease. What should I know about cancer screening? Depending on your health history and family history, you may need to have cancer screening at various ages. This may include screening for: Breast cancer. Cervical cancer. Colorectal cancer. Skin cancer. Lung cancer. What should I know about heart disease, diabetes, and high blood pressure? Blood pressure and heart  disease High blood pressure causes heart disease and increases the risk of stroke. This is more likely to develop in people who have high blood pressure readings, are of African descent, or are overweight. Have your blood pressure checked: Every 3-5 years if you are 18-39 years of age. Every year if you are 40 years old or older. Diabetes Have regular diabetes screenings. This checks your fasting blood sugar level. Have the screening done: Once every three years after age 40 if you are at a normal weight and have a low risk for diabetes. More often and at a younger age if you are overweight or have a high risk for diabetes. What should I know about preventing infection? Hepatitis B If you have a higher risk for hepatitis B, you should be screened for this virus. Talk with your health care provider to find out if you are at risk forhepatitis B infection. Hepatitis C Testing is recommended for: Everyone born from 1945 through 1965. Anyone with known risk factors for hepatitis C. Sexually transmitted infections (STIs) Get screened for STIs, including gonorrhea and chlamydia, if: You are sexually active and are younger than 54 years of age. You are older than 54 years of age and your health care provider tells you that you are at risk for this type of infection. Your sexual activity has changed since you were last screened, and you are at increased risk for chlamydia or gonorrhea. Ask your health care provider if you are at risk. Ask your health care provider about whether you are at high risk for HIV. Your health care provider may recommend a prescription medicine to help   prevent HIV infection. If you choose to take medicine to prevent HIV, you should first get tested for HIV. You should then be tested every 3 months for as long as you are taking the medicine. Pregnancy If you are about to stop having your period (premenopausal) and you may become pregnant, seek counseling before you get  pregnant. Take 400 to 800 micrograms (mcg) of folic acid every day if you become pregnant. Ask for birth control (contraception) if you want to prevent pregnancy. Osteoporosis and menopause Osteoporosis is a disease in which the bones lose minerals and strength with aging. This can result in bone fractures. If you are 65 years old or older, or if you are at risk for osteoporosis and fractures, ask your health care provider if you should: Be screened for bone loss. Take a calcium or vitamin D supplement to lower your risk of fractures. Be given hormone replacement therapy (HRT) to treat symptoms of menopause. Follow these instructions at home: Lifestyle Do not use any products that contain nicotine or tobacco, such as cigarettes, e-cigarettes, and chewing tobacco. If you need help quitting, ask your health care provider. Do not use street drugs. Do not share needles. Ask your health care provider for help if you need support or information about quitting drugs. Alcohol use Do not drink alcohol if: Your health care provider tells you not to drink. You are pregnant, may be pregnant, or are planning to become pregnant. If you drink alcohol: Limit how much you use to 0-1 drink a day. Limit intake if you are breastfeeding. Be aware of how much alcohol is in your drink. In the [place] one drink equals one 12 oz bottle of beer (355 mL), one 5 oz glass of wine (148 mL), or one 1 oz glass of hard liquor (44 mL). General instructions Schedule regular health, dental, and eye exams. Stay current with your vaccines. Tell your health care provider if: You often feel depressed. You have ever been abused or do not feel safe at home. Summary Adopting a healthy lifestyle and getting preventive care are important in promoting health and wellness. Follow your health care provider's instructions about healthy diet, exercising, and getting tested or screened for diseases. Follow your health care provider's  instructions on monitoring your cholesterol and blood pressure. This information is not intended to replace advice given to you by your health care provider. Make sure you discuss any questions you have with your healthcare provider. Document Revised: 04/14/2018 Document Reviewed: 04/14/2018 Elsevier Patient Education  2022 [employer] Inc[employer]  

## 2020-12-17 NOTE — Progress Notes (Deleted)
Subjective:    Patient ID: Karen Whitehead, female    DOB: 16-Apr-1966, 55 y.o.   MRN: [id number]  HPI Pain Inventory Average Pain 9 Pain Right Now 8 My pain is constant, sharp, burning, dull, stabbing, tingling, and aching  In the last 24 hours, has pain interfered with the following? General activity 6 Relation with others 6 Enjoyment of life 6 What TIME of day is your pain at its worst? morning , daytime, evening, and night Sleep (in general) Poor  Pain is worse with: walking, bending, sitting, and standing Pain improves with:  n/a Relief from Meds: 0  No family history on file. Social History   Socioeconomic History   Marital status: Single    Spouse name: Not on file   Number of children: Not on file   Years of education: Not on file   Highest education level: Not on file  Occupational History   Not on file  Tobacco Use   Smoking status: Every Day    Packs/day: 0.50    Types: Cigarettes   Smokeless tobacco: Never  Substance and Sexual Activity   Alcohol use: No   Drug use: No   Sexual activity: Not on file  Other Topics Concern   Not on file  Social History Narrative   Not on file   Social Determinants of Health   Financial Resource Strain: Not on file  Food Insecurity: Not on file  Transportation Needs: Not on file  Physical Activity: Not on file  Stress: Not on file  Social Connections: Not on file   Past Surgical History:  Procedure Laterality Date   APPENDECTOMY     Past Surgical History:  Procedure Laterality Date   APPENDECTOMY     Past Medical History:  Diagnosis Date   Granuloma annulare    BP 130/82   Pulse 85   Temp 98.5 F (36.9 C) (Oral)   Ht '5\' 4"'$  (1.626 m)   Wt 226 lb (102.5 kg)   SpO2 94%   BMI 38.79 kg/m   Opioid Risk Score:   Fall Risk Score:  `1  Depression screen PHQ 2/9  Depression screen [facility] 2/9 10/31/2020 09/20/2020 02/06/2020 11/16/2019 11/03/2019  Decreased Interest '3 3 3 3 3  '$ Down, Depressed, Hopeless '3 3 3 3 3   '$ PHQ - 2 Score '6 6 6 6 6  '$ Altered sleeping '3 3 3 3 3  '$ Tired, decreased energy '3 3 3 3 3  '$ Change in appetite '3 3 3 3 3  '$ Feeling bad or failure about yourself  '3 3 3 3 3  '$ Trouble concentrating 3 - '3 3 2  '$ Moving slowly or fidgety/restless '3 2 1 '$ 0 -  Suicidal thoughts '2 1 2 1 1  '$ PHQ-9 Score '26 21 24 22 21  '$ Difficult doing work/chores - Extremely dIfficult - - -       Review of Systems  Constitutional: Negative.   HENT: Negative.    Eyes: Negative.   Respiratory: Negative.    Cardiovascular: Negative.   Gastrointestinal: Negative.   Endocrine: Negative.   Genitourinary: Negative.   Musculoskeletal:  Positive for back pain, gait problem and neck pain.       Pain all over body  Skin: Negative.   Allergic/Immunologic: Negative.   Neurological:  Positive for numbness.  Hematological: Negative.   Psychiatric/Behavioral:  The patient is nervous/anxious.       Objective:   Physical Exam        Assessment & Plan:

## 2020-12-17 NOTE — Progress Notes (Signed)
Subjective:    Patient ID: Karen Whitehead, female    DOB: 12-Jul-1965, 55 y.o.   MRN: [id number]  HPI   Karen Whitehead is a 55 year old woman who presents for f/u of myalgias post-dengue fever.  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  -she gets lots of headaches -she has never tried topamax and amitriptyline  -reviewed her hip and knee XRs with her: negative -she would like to get XRs of her spine as well.  -her hands and muscles throb so baldy that she cannot fall asleep at night  2)  Insomnia: -she currently takes trazodone and sertraline.  -she has not found relief with the amitriptyline '10mg'$ .  -her pain keeps her awake  3) memory loss -has not worked with SLP before -does not follow with neurology -discussed results of her MRI brain with her- shows degeneration atypical for her age but no acute processes.   4) Rash- has to wear everything shortsleeve  Diet: her son usually cooks her meals for her.    Pain Inventory Average Pain 9 Pain Right Now 8 My pain is constant, sharp, stabbing, tingling and aching  In the last 24 hours, has pain interfered with the following? General activity 1 Relation with others 1 Enjoyment of life 0 What TIME of day is your pain at its worst? morning , daytime, evening and night Sleep (in general) Poor  Pain is worse with: walking, bending, inactivity and standing Pain improves with: n/a Relief from Meds: 0  use a walker how many minutes can you walk? 5 ability to climb steps?  yes do you drive?  no  not employed: date last employed . I need assistance with the following:  dressing, bathing, household duties and shopping  bladder control problems weakness numbness tingling trouble  walking spasms dizziness confusion depression anxiety suicidal thoughts        No family history on file. Social History   Socioeconomic History   Marital status: Single    Spouse name: Not on file   Number of children: Not on file   Years of education: Not on file   Highest education level: Not on file  Occupational History   Not on file  Tobacco Use   Smoking status: Every Day    Packs/day: 0.50    Types: Cigarettes   Smokeless tobacco: Never  Substance and Sexual Activity   Alcohol use: No   Drug use: No   Sexual activity: Not on file  Other Topics Concern   Not on file  Social History Narrative   Not on file   Social Determinants of Health   Financial Resource Strain: Not on file  Food Insecurity: Not on file  Transportation Needs: Not on file  Physical Activity: Not on file  Stress: Not on file  Social Connections: Not on file   Past Surgical History:  Procedure Laterality Date   APPENDECTOMY     Past Medical History:  Diagnosis Date   Granuloma annulare    There were no vitals taken for this visit.  Opioid Risk Score:   Fall Risk Score:  `1  Depression screen PHQ 2/9  Depression screen [facility] 2/9 10/31/2020 09/20/2020 02/06/2020 11/16/2019 11/03/2019  Decreased Interest 3 3  $'3 3 3  'j$ Down, Depressed, Hopeless '3 3 3 3 3  '$ PHQ - 2 Score '6 6 6 6 6  '$ Altered sleeping '3 3 3 3 3  '$ Tired, decreased energy '3 3 3 3 3  '$ Change in appetite '3 3 3 3 3  '$ Feeling bad or failure about yourself  '3 3 3 3 3  '$ Trouble concentrating 3 - '3 3 2  '$ Moving slowly or fidgety/restless '3 2 1 '$ 0 -  Suicidal thoughts '2 1 2 1 1  '$ PHQ-9 Score '26 21 24 22 21  '$ Difficult doing work/chores - Extremely dIfficult - - -    Review of Systems  Constitutional:  Positive for appetite change.  Respiratory:  Positive for cough and shortness of breath.   Gastrointestinal:  Positive for constipation.  Musculoskeletal:  Positive for back pain, gait problem and neck pain.       Left hip and knee  pain Pain in back of legs  Neurological:  Positive for dizziness, weakness and numbness.       Tongling  Psychiatric/Behavioral:  Positive for confusion, dysphoric mood and suicidal ideas. The patient is nervous/anxious.   All other systems reviewed and are negative.      Objective:   Physical Exam Gen: no distress, normal appearing HEENT: oral mucosa pink and moist, NCAT Cardio: Reg rate Chest: normal effort, normal rate of breathing Abd: soft, non-distended Ext: no edema Psych: pleasant, normal affect Skin: intact Neuro: slow speech, slow processing Musculoskeletal: extremely decreased strength 3/5 throughout.     Assessment & Plan:  1)Chronic pain syndrome and fatigue secondary to myalgias and arthralgias following Dengue fever -Discussed current symptoms of pain and history of pain.  -Discussed benefits of exercise in reducing pain. -discussed that XRs of hip and knees are normal -ordered XRs of spine as well.  -continue water aerobics -cannot increase meloxicam further given risk of GI bleeding.  -cannot cymbalta due to risk of bleed.  -tylenol does not help -lidocaine patches do help -Discussed following foods that may reduce pain: 1) Ginger (especially studied for arthritis)- reduce leukotriene production to decrease inflammation 2) Blueberries- high in phytonutrients that decrease inflammation 3) Salmon- marine omega-3s reduce joint swelling and pain 4) Pumpkin seeds- reduce inflammation 5) dark chocolate- reduces inflammation 6) turmeric- reduces inflammation 7) tart cherries - reduce pain and stiffness 8) extra virgin olive oil - its compound olecanthal helps to block prostaglandins  9) chili peppers- can be eaten or applied topically via capsaicin 10) mint- helpful for headache, muscle aches, joint pain, and itching 11) garlic- reduces inflammation  Link to further information on diet for chronic pain:  [email or web address]   2) Insomnia:  -continue amitriptyline to '25mg'$ .  -seroquel produced hallucinations.   3) Left sided weakness/memory loss -MRI brain ordered to assess for possible contributory neurological process: reviewed results with her: shows degeneration atypical for her age without acute process -referred to neurology -restart 1TB coconut oil daily, discussed may feel nausea at first, goal to increase to 4 TB over time as tolerated  4) Anxiety: discussed that her clonopin has to be filled by her psychiatry or internal medicine.   5) Myofascial pain: will schedule for trigger point injections.

## 2020-12-17 NOTE — Progress Notes (Signed)
Subjective:  Patient ID: Karen Whitehead, female    DOB: 1966-03-20  Age: 55 y.o. MRN: [id number]  CC: Annual Exam and Gynecologic Exam   HPI Karen Whitehead is a 55 y.o. year old female with a history of Type2 DM (A1c 6.4), anxiety and depression, insomnia, Granuloma Annulare . She presents for complete physical exam and is due for Pap smear, mammogram and colonoscopy.  Interval History: Complains of urge incontinence but is not ready to be placed on any medications as she likes to use natural remedies. Past Medical History:  Diagnosis Date   Granuloma annulare     Past Surgical History:  Procedure Laterality Date   APPENDECTOMY      No family history on file.  No Known Allergies  Outpatient Medications Prior to Visit  Medication Sig Dispense Refill   amitriptyline (ELAVIL) 25 MG tablet Take 1 tablet (25 mg total) by mouth at bedtime. 30 tablet 3   atorvastatin (LIPITOR) 40 MG tablet Take 1 tablet (40 mg total) by mouth daily. 90 tablet 3   Blood Glucose Monitoring Suppl (TRUE METRIX METER) w/Device KIT 1 each by Does not apply route in the morning and at bedtime. 1 kit 0   busPIRone (BUSPAR) 7.5 MG tablet TAKE 1 TABLET (7.5 MG TOTAL) BY MOUTH 2 (TWO) TIMES DAILY. 60 tablet 3   clonazePAM (KLONOPIN) 1 MG tablet Take 1 tablet (1 mg total) by mouth 2 (two) times daily as needed. for anxiety 60 tablet 1   cyclobenzaprine (FLEXERIL) 10 MG tablet Take 1 tablet (10 mg total) by mouth 2 (two) times daily as needed for muscle spasms as needed for muscle pain 60 tablet 6   dapagliflozin propanediol (FARXIGA) 5 MG TABS tablet TAKE 1 TABLET (5 MG TOTAL) BY MOUTH DAILY BEFORE BREAKFAST. 30 tablet 3   fluticasone (CUTIVATE) 0.005 % ointment Apply 1 application topically 2 (two) times daily.     gabapentin (NEURONTIN) 300 MG capsule Take 1 capsule (300 mg total) by mouth 2 (two) times daily. 60 capsule 6   glipiZIDE (GLUCOTROL) 5 MG tablet TAKE 0.5 TABLETS (2.5 MG TOTAL) BY MOUTH 2 (TWO) TIMES  DAILY BEFORE A MEAL. 30 tablet 3   glucose blood (TRUE METRIX BLOOD GLUCOSE TEST) test strip Use as instructed 100 each 12   glucose blood test strip USE AS INSTRUCTED 100 strip 12   lidocaine (LIDODERM) 5 % Place 2 patches onto the skin daily. Remove & Discard patch within 12 hours or as directed by MD 30 patch 6   QUEtiapine (SEROQUEL) 200 MG tablet Take 1 tablet (200 mg total) by mouth at bedtime. 30 tablet 1   sertraline (ZOLOFT) 100 MG tablet TAKE 2 TABLETS BY MOUTH ONCE A DAY 60 tablet 1   topiramate (TOPAMAX) 25 MG tablet Take 1 tablet (25 mg total) by mouth at bedtime. 30 tablet 3   traZODone (DESYREL) 100 MG tablet TAKE 2 TABLETS BY MOUTH AT BEDTIME 60 tablet 1   TRUEplus Lancets 28G MISC Use as directed in the morning and at bedtime. 100 each 1   albuterol (VENTOLIN HFA) 108 (90 Base) MCG/ACT inhaler INHALE 1-2 PUFFS INTO THE LUNGS EVERY 6 (SIX) HOURS AS NEEDED FOR WHEEZING OR SHORTNESS OF BREATH. 18 g 1   No facility-administered medications prior to visit.     ROS Review of Systems  Constitutional:  Negative for activity change, appetite change and fatigue.  HENT:  Negative for congestion, sinus pressure and sore throat.   Eyes:  Negative for visual disturbance.  Respiratory:  Negative for cough, chest tightness, shortness of breath and wheezing.   Cardiovascular:  Negative for chest pain and palpitations.  Gastrointestinal:  Negative for abdominal distention, abdominal pain and constipation.  Endocrine: Negative for polydipsia.  Genitourinary:  Negative for dysuria, frequency and pelvic pain.  Musculoskeletal:  Positive for arthralgias and myalgias. Negative for back pain.  Skin:  Negative for rash.  Neurological:  Negative for tremors, light-headedness and numbness.  Hematological:  Does not bruise/bleed easily.  Psychiatric/Behavioral:  Positive for dysphoric mood. Negative for agitation and behavioral problems.    Objective:  BP 130/88   Pulse 86   Ht $R'5\' 4"'PS$  (1.626 m)    Wt 227 lb 12.8 oz (103.3 kg)   SpO2 96%   BMI 39.10 kg/m   BP/Weight 12/17/2020 12/17/2020 8/34/1962  Systolic BP 229 798 921  Diastolic BP 82 88 79  Wt. (Lbs) 226 227.8 219.8  BMI 38.79 39.1 37.73      Physical Exam Exam conducted with a chaperone present.  Constitutional:      General: She is not in acute distress.    Appearance: She is well-developed. She is not diaphoretic.  HENT:     Head: Normocephalic.     Right Ear: External ear normal.     Left Ear: External ear normal.     Nose: Nose normal.  Eyes:     Conjunctiva/sclera: Conjunctivae normal.     Pupils: Pupils are equal, round, and reactive to light.  Neck:     Vascular: No JVD.  Cardiovascular:     Rate and Rhythm: Normal rate and regular rhythm.     Heart sounds: Normal heart sounds. No murmur heard.   No gallop.  Pulmonary:     Effort: Pulmonary effort is normal. No respiratory distress.     Breath sounds: Normal breath sounds. No wheezing or rales.  Chest:     Chest wall: No tenderness.  Breasts:    Right: Normal. No mass, nipple discharge or tenderness.     Left: Normal. No mass, nipple discharge or tenderness.  Abdominal:     General: Bowel sounds are normal. There is no distension.     Palpations: Abdomen is soft. There is no mass.     Tenderness: There is no abdominal tenderness.     Hernia: There is no hernia in the left inguinal area or right inguinal area.  Genitourinary:    General: Normal vulva.     Pubic Area: No rash.      Labia:        Right: No rash.        Left: No rash.      Vagina: Normal.     Cervix: Lesion (pedunculated lesion from internal os) present.     Uterus: Normal.      Adnexa: Right adnexa normal and left adnexa normal.       Right: No tenderness.         Left: No tenderness.    Musculoskeletal:        General: No tenderness. Normal range of motion.     Cervical back: Normal range of motion. No tenderness.  Lymphadenopathy:     Upper Body:     Right upper body:  No supraclavicular or axillary adenopathy.     Left upper body: No supraclavicular or axillary adenopathy.  Skin:    General: Skin is warm and dry.     Comments: Circular skin lesions widely distributed on  entire body surface  Neurological:     Mental Status: She is alert and oriented to person, place, and time.     Deep Tendon Reflexes: Reflexes are normal and symmetric.    CMP Latest Ref Rng & Units 10/16/2019 10/16/2019 02/19/2016  Glucose 70 - 99 mg/dL - 153(H) 111(H)  BUN 6 - 20 mg/dL - 7 7  Creatinine 0.44 - 1.00 mg/dL - 0.87 0.87  Sodium 135 - 145 mmol/L 139 138 139  Potassium 3.5 - 5.1 mmol/L 3.4(L) 3.5 4.0  Chloride 98 - 111 mmol/L - 100 106  CO2 22 - 32 mmol/L - 24 25  Calcium 8.9 - 10.3 mg/dL - 9.8 9.8    Lipid Panel     Component Value Date/Time   CHOL 282 (H) 10/31/2020 1004   TRIG 257 (H) 10/31/2020 1004   HDL 47 10/31/2020 1004   CHOLHDL 6.0 (H) 10/31/2020 1004   LDLCALC 185 (H) 10/31/2020 1004    CBC    Component Value Date/Time   WBC 10.5 10/16/2019 1515   RBC 4.82 10/16/2019 1515   HGB 15.6 (H) 10/16/2019 1751   HCT 46.0 10/16/2019 1751   PLT 289 10/16/2019 1515   MCV 92.5 10/16/2019 1515   MCH 31.5 10/16/2019 1515   MCHC 34.1 10/16/2019 1515   RDW 12.6 10/16/2019 1515   LYMPHSABS 3.0 11/10/2018 1315   MONOABS 0.6 11/10/2018 1315   EOSABS 0.3 11/10/2018 1315   BASOSABS 0.1 11/10/2018 1315    Lab Results  Component Value Date   HGBA1C 6.4 10/31/2020    Assessment & Plan:  1. Annual physical exam Counseled on 150 minutes of exercise per week, healthy eating (including decreased daily intake of saturated fats, cholesterol, added sugars, sodium), routine healthcare maintenance.  - Basic Metabolic Panel  2. Screening for cervical cancer - Cytology - PAP([place])  3. Encounter for screening mammogram for malignant neoplasm of breast - MM 3D SCREEN BREAST BILATERAL; Future  4. Cervical polyp - US Pelvic Complete With Transvaginal;  Future  5. Urge incontinence of urine Counseled on Kegel exercises which unfortunately she will be unable to perform due to chronic pain She is not open to medications but would like to try herbal remedies.  6. Screening for colon cancer - Ambulatory referral to Gastroenterology    No orders of the defined types were placed in this encounter.   Follow-up: No follow-ups on file.      Charlott Rakes, MD, FAAFP. [facility] [place] [place], [place]   12/17/2020, 3:52 PM

## 2020-12-17 NOTE — Progress Notes (Deleted)
   Subjective:    Patient ID: Karen Whitehead, female    DOB: 03/16/1966, 55 y.o.   MRN: [id number]  HPI Pain Inventory Average Pain {NUMBERS; 0-10:5044} Pain Right Now {NUMBERS; 0-10:5044} My pain is {PAIN DESCRIPTION:21022940}  In the last 24 hours, has pain interfered with the following? General activity {NUMBERS; 0-10:5044} Relation with others {NUMBERS; 0-10:5044} Enjoyment of life {NUMBERS; 0-10:5044} What TIME of day is your pain at its worst? {time of day:24191} Sleep (in general) {BHH GOOD/FAIR/POOR:22877}  Pain is worse with: {ACTIVITIES:21022942} Pain improves with: {PAIN IMPROVES [id number] Relief from Meds: {NUMBERS; 0-10:5044}  No family history on file. Social History   Socioeconomic History   Marital status: Single    Spouse name: Not on file   Number of children: Not on file   Years of education: Not on file   Highest education level: Not on file  Occupational History   Not on file  Tobacco Use   Smoking status: Every Day    Packs/day: 0.50    Types: Cigarettes   Smokeless tobacco: Never  Substance and Sexual Activity   Alcohol use: No   Drug use: No   Sexual activity: Not on file  Other Topics Concern   Not on file  Social History Narrative   Not on file   Social Determinants of Health   Financial Resource Strain: Not on file  Food Insecurity: Not on file  Transportation Needs: Not on file  Physical Activity: Not on file  Stress: Not on file  Social Connections: Not on file   Past Surgical History:  Procedure Laterality Date   APPENDECTOMY     Past Surgical History:  Procedure Laterality Date   APPENDECTOMY     Past Medical History:  Diagnosis Date   Granuloma annulare    BP 130/82   Pulse 85   Temp 98.5 F (36.9 C) (Oral)   Ht '5\' 4"'$  (1.626 m)   Wt 226 lb (102.5 kg)   SpO2 94%   BMI 38.79 kg/m   Opioid Risk Score:   Fall Risk Score:  `1  Depression screen PHQ 2/9  Depression screen [facility] 2/9 10/31/2020 09/20/2020  02/06/2020 11/16/2019 11/03/2019  Decreased Interest '3 3 3 3 3  '$ Down, Depressed, Hopeless '3 3 3 3 3  '$ PHQ - 2 Score '6 6 6 6 6  '$ Altered sleeping '3 3 3 3 3  '$ Tired, decreased energy '3 3 3 3 3  '$ Change in appetite '3 3 3 3 3  '$ Feeling bad or failure about yourself  '3 3 3 3 3  '$ Trouble concentrating 3 - '3 3 2  '$ Moving slowly or fidgety/restless '3 2 1 '$ 0 -  Suicidal thoughts '2 1 2 1 1  '$ PHQ-9 Score '26 21 24 22 21  '$ Difficult doing work/chores - Extremely dIfficult - - -      Review of Systems     Objective:   Physical Exam        Assessment & Plan:

## 2020-12-17 NOTE — Progress Notes (Signed)
Physical/Pap

## 2020-12-17 NOTE — Patient Instructions (Signed)
1) Ginger (especially studied for arthritis)- reduce leukotriene production to decrease inflammation 2) Blueberries- high in phytonutrients that decrease inflammation 3) Salmon- marine omega-3s reduce joint swelling and pain 4) Pumpkin seeds- reduce inflammation 5) dark chocolate- reduces inflammation 6) turmeric- reduces inflammation 7) tart cherries - reduce pain and stiffness 8) extra virgin olive oil - its compound olecanthal helps to block prostaglandins  9) chili peppers- can be eaten or applied topically via capsaicin 10) mint- helpful for headache, muscle aches, joint pain, and itching 11) garlic- reduces inflammation  Link to further information on diet for chronic pain: [email or web address]  

## 2020-12-18 ENCOUNTER — Encounter: Payer: Self-pay | Admitting: Family Medicine

## 2020-12-18 ENCOUNTER — Ambulatory Visit (HOSPITAL_BASED_OUTPATIENT_CLINIC_OR_DEPARTMENT_OTHER): Payer: No Typology Code available for payment source | Admitting: Physical Therapy

## 2020-12-18 ENCOUNTER — Encounter (HOSPITAL_BASED_OUTPATIENT_CLINIC_OR_DEPARTMENT_OTHER): Payer: Self-pay | Admitting: Physical Therapy

## 2020-12-18 DIAGNOSIS — R296 Repeated falls: Secondary | ICD-10-CM

## 2020-12-18 DIAGNOSIS — M25562 Pain in left knee: Secondary | ICD-10-CM

## 2020-12-18 DIAGNOSIS — R278 Other lack of coordination: Secondary | ICD-10-CM

## 2020-12-18 DIAGNOSIS — M545 Low back pain, unspecified: Secondary | ICD-10-CM

## 2020-12-18 DIAGNOSIS — R262 Difficulty in walking, not elsewhere classified: Secondary | ICD-10-CM

## 2020-12-18 DIAGNOSIS — G8929 Other chronic pain: Secondary | ICD-10-CM

## 2020-12-18 DIAGNOSIS — M6281 Muscle weakness (generalized): Secondary | ICD-10-CM

## 2020-12-18 LAB — BASIC METABOLIC PANEL
BUN/Creatinine Ratio: 14 (ref 9–23)
BUN: 14 mg/dL (ref 6–24)
CO2: 24 mmol/L (ref 20–29)
Calcium: 9.6 mg/dL (ref 8.7–10.2)
Chloride: 100 mmol/L (ref 96–106)
Creatinine, Ser: 1.03 mg/dL — ABNORMAL HIGH (ref 0.57–1.00)
Glucose: 75 mg/dL (ref 65–99)
Potassium: 4.1 mmol/L (ref 3.5–5.2)
Sodium: 136 mmol/L (ref 134–144)
eGFR: 65 mL/min/{1.73_m2} (ref >59)

## 2020-12-18 NOTE — Therapy (Signed)
[place], [place], [address] Phone: [phone]   Fax:  [phone]  Physical Therapy Treatment  Patient Details  Name: Karen Whitehead MRN: [id number] Date of Birth: 07/06/1965 Referring Provider (PT): Dr. Charlott Rakes   Encounter Date: 12/18/2020   PT End of Session - 12/18/20 1526     Visit Number 5    Number of Visits 24    Date for PT Re-Evaluation 12/31/20    Progress Note Due on Visit --   in approx 3 visits   PT Start Time 1432    PT Stop Time 1518    PT Time Calculation (min) 46 min    Equipment Utilized During Treatment Other (comment)   water walker, chair lift   Activity Tolerance Other (comment)   Pt was very limited today due to mobility deficits and weakness   Behavior During Therapy [facility] for tasks assessed/performed             Past Medical History:  Diagnosis Date   Granuloma annulare     Past Surgical History:  Procedure Laterality Date   APPENDECTOMY      There were no vitals filed for this visit.   Subjective Assessment - 12/18/20 1524     Subjective Pt states she did fine after prior Rx, she was just tired.  Pt states she feels like she is getting weaker but doesn't understand why.  Her son has to help her get out of bed most days.  Pt states her R arm from her wrist up was really hurting earlier today.  Her back is bothering her and she states she was having a 10/10 pain this AM.  Pt saw MD yesterday and states she will be having injections into her muscles to improve tightness.  Pt states she had a papsmear and they found something on her uterus.  She is going to have an [place] to determine if she may need surgery.  Pt sees neurologist this week.    Pertinent History Fall Risk.  Granuloma Annulare, major depressive disoder, legionnairre's disease, dengue fever, DM, memory loss, anxiety.    Diagnostic tests Brain MRI:  IMPRESSION:  No acute or reversible finding. Generalized  brain volume loss, more  than usually seen at this age. Few scattered punctate foci T2 and  FLAIR signal in the hemispheric white matter consistent mild chronic  small vessel change. per MD note.  Pt's hip and knee x ray are negative.    Currently in Pain? Yes    Pain Score --   7-8/10 lumbar pain             Pt seen for aquatic therapy today.  Treatment took place in water 3.5-4 ft in depth at the [facility]. Temp of water was 93.  Pt entered/exited the pool via chair lift.  Pt required much assistance from PT and instruction to perform transfers today.  Pt was slow with mobility and it took her much increased time to perform transfers.   Pt requires buoyancy for support and to offload joints with strengthening exercises. Viscosity of the water is needed for resistance of strengthening; water current perturbations provides challenge to standing balance unsupported, requiring increased core activation.   -Pt ambulated 1 lap with water walker with min assist and with cuing to advance LE's L > R.      -Pt performed:     -seated marching 1 x 10 reps.     -seated  hip abd/add 2x10 reps with reduced movement on L LE     -seated heel raises 1x10 reps--primarily R LE performing movement     -seated LAQ 1x10 on R, Pt unable to perform on L LE       -seated bicycles 2x10 reps      -seated without back support or feet on floor for improved core strength and activation 3x25-30 sec       PT Education - 12/18/20 2055     Education Details Instructed pt in correct form with exercises.  Instructed pt in positioning and correct performance of transfers to/from W/C and chair    Person(s) Educated Patient    Methods Explanation;Verbal cues;Demonstration    Comprehension Verbalized understanding;Returned demonstration              PT Short Term Goals - 11/19/20 2157       PT SHORT TERM GOAL #1   Title Pt will be able to perform sit to stand transfers safely and independently.     Baseline requires min assist    Time 3    Period Weeks    Status New    Target Date 12/10/20      PT SHORT TERM GOAL #2   Title Pt will report at least a 25% improvement overall in pain and mobility.    Time 4    Period Weeks    Status New    Target Date 12/17/20      PT SHORT TERM GOAL #3   Title Pt will ambulate at least 100 ft with improved gait speed and correct usage of walker.    Baseline Pt had difficulty including L LE dragging with limited foot clearance after ambulating 15 ft    Time 4    Period Weeks    Target Date 12/17/20      PT SHORT TERM GOAL #4   Title Pt will  improve TUG time to no > 79mn and 20 seconds with walker.    Baseline 254m 40 seconds    Time 5    Period Weeks    Status New    Target Date 12/24/20      PT SHORT TERM GOAL #5   Title Pt will demo improved LE strength to 4- to 4/5 MMT in hip flexion, knee flex and extension, and DF bilat for improved mobility, ambulation, and tolerance to activity.    Baseline see objective findings in IE    Time 6    Period Weeks    Status New    Target Date 12/31/20      Additional Short Term Goals   Additional Short Term Goals Yes      PT SHORT TERM GOAL #6   Title Pt will report improved standing duration at home to improve performance of self care activities and light IADLs.    Time 5    Period Weeks    Status New    Target Date 12/24/20               PT Long Term Goals - 11/19/20 2220       PT LONG TERM GOAL #1   Title Pt will ambulate at least 200 ft with walker with no > than SBA/CGA without having to sit down    Time 8    Period Weeks    Status New    Target Date 01/14/21      PT LONG TERM GOAL #2   Title Pt will  tolerate aquatic therapy without significant increased pain in order to establish an exercise routine to improve functional mobility and tolerance to activity.    Time 8    Period Weeks    Status New    Target Date 01/14/21      PT LONG TERM GOAL #3   Title Pt will be  able to perform TUG in no > than 20 sec for improved mobility and gait speed.    Time --   10-12   Period Weeks    Status New    Target Date 02/11/21      PT LONG TERM GOAL #4   Title Pt will be able to ambulate community distance with walker without significant pain or difficulty.    Time --   10-12   Period Weeks    Target Date 02/11/21      PT LONG TERM GOAL #5   Title Pt will report she is able to perform bed mobility, self care activities, and some light IADLs in her home without signifiant back and LE pain.    Time --   10-12   Period Weeks    Target Date 02/11/21      Additional Long Term Goals   Additional Long Term Goals Yes      PT LONG TERM GOAL #6   Title Pt will be able to perform aquatic program with appropriate assistance without increased pain for improved function, mobility, balance, strength, and activity tolerance.    Time --   10-12   Period Weeks    Status New    Target Date 02/11/21      PT LONG TERM GOAL #7   Title Pt will report she is able to perform stairs at home safely without assistance to enter/exit home.    Time --   10-12   Period Weeks    Status New    Target Date 02/11/21      PT LONG TERM GOAL #8   Title Pt will be able to perform at least 7 consecutive sit to stands with light UE assistance for improved functional LE strength and performance of daily transfers.    Time 10    Period Weeks    Status New    Target Date 01/28/21                   Plan - 12/18/20 1525     Clinical Impression Statement Pt had poor tolerance with mobility and with exercises today.  She required significant assistance and instruction from PT to perform transfers out of chair and w/c today.  She had increased difficulty with mobility and movement today including getting out of chair and walking in the water.  Pt had to use the chair lift today to enter/exity the pool.  Pt had significant difficulty and delays with lifting, moving, and advancing L LE.   She used the water walker and was very limited with walking in the pool today having increased instability.  It took her increased time to take steps and required asisstance from PT for balance, advancement, and cuing to step.  Pt was very limited with ROM and performance of aquatic exercises with L LE.    Comorbidities Fall Risk. Granuloma Annulare, major depressive disorder. Hx of legionnairre's disease and dengue fever, DM    PT Treatment/Interventions ADLs/Self Care Home Management;Aquatic Therapy;Cryotherapy;Electrical Stimulation;Ultrasound;Moist Heat;Gait training;Stair training;Functional mobility training;Therapeutic activities;Therapeutic exercise;Balance training;Neuromuscular re-education;Manual techniques;Patient/family education    PT Next Visit Plan Pt  requires assistance with transfers, mobility, and ambulation/exercises in the pool.  Cont with aquatic therapy.  Cont using the water walker with ambulation in the pool.  PN in approx 3 visits    PT Home Exercise Plan Pt has already received land based PT and did not improve.    Consulted and Agree with Plan of Care Patient             Patient will benefit from skilled therapeutic intervention in order to improve the following deficits and impairments:  Abnormal gait, Decreased activity tolerance, Decreased balance, Decreased mobility, Decreased endurance, Decreased range of motion, Decreased safety awareness, Decreased strength, Difficulty walking, Pain  Visit Diagnosis: Chronic low back pain, unspecified back pain laterality, unspecified whether sciatica present  Muscle weakness (generalized)  Difficulty in walking, not elsewhere classified  Chronic pain of both knees  Other lack of coordination  Repeated falls     Problem List Patient Active Problem List   Diagnosis Date Noted   Major depressive disorder, recurrent episode with anxious distress ([place]) 03/14/2020   Granuloma annulare 11/03/2019   History of dengue  11/03/2019   History of Legionnaire's disease 11/03/2019    Selinda Michaels III PT, DPT 12/18/20 9:24 PM   [facility] [address] [place], [place], [address] Phone: [phone]   Fax:  [phone]  Name: Karen Whitehead MRN: [id number] Date of Birth: 04/03/1966

## 2020-12-20 ENCOUNTER — Ambulatory Visit (HOSPITAL_BASED_OUTPATIENT_CLINIC_OR_DEPARTMENT_OTHER): Payer: Self-pay | Admitting: Physical Therapy

## 2020-12-21 ENCOUNTER — Ambulatory Visit (INDEPENDENT_AMBULATORY_CARE_PROVIDER_SITE_OTHER): Payer: Self-pay | Admitting: Neurology

## 2020-12-21 ENCOUNTER — Other Ambulatory Visit: Payer: Self-pay

## 2020-12-21 ENCOUNTER — Encounter: Payer: Self-pay | Admitting: Neurology

## 2020-12-21 VITALS — BP 110/82 | HR 82 | Ht 64.0 in | Wt 226.0 lb

## 2020-12-21 DIAGNOSIS — R531 Weakness: Secondary | ICD-10-CM

## 2020-12-21 DIAGNOSIS — G43709 Chronic migraine without aura, not intractable, without status migrainosus: Secondary | ICD-10-CM

## 2020-12-21 DIAGNOSIS — M5441 Lumbago with sciatica, right side: Secondary | ICD-10-CM

## 2020-12-21 DIAGNOSIS — M5442 Lumbago with sciatica, left side: Secondary | ICD-10-CM

## 2020-12-21 DIAGNOSIS — R52 Pain, unspecified: Secondary | ICD-10-CM

## 2020-12-21 LAB — CYTOLOGY - PAP
Comment: NEGATIVE
Comment: NEGATIVE
HPV 16: NEGATIVE
HPV 18 / 45: NEGATIVE
High risk HPV: POSITIVE — AB

## 2020-12-21 MED ORDER — DIVALPROEX SODIUM ER 500 MG PO TB24
500.0000 mg | ORAL_TABLET | Freq: Every evening | ORAL | 6 refills | Status: DC
  Filled 2020-12-21: qty 30, 30d supply, fill #0

## 2020-12-21 MED ORDER — SUMATRIPTAN SUCCINATE 50 MG PO TABS
50.0000 mg | ORAL_TABLET | ORAL | 6 refills | Status: AC | PRN
  Filled 2020-12-21: qty 12, 6d supply, fill #0
  Filled 2021-02-04: qty 9, 30d supply, fill #0
  Filled 2021-03-06 – 2021-03-14 (×2): qty 9, 30d supply, fill #1
  Filled 2021-04-11: qty 9, 30d supply, fill #2

## 2020-12-21 NOTE — Progress Notes (Signed)
Chief Complaint  Patient presents with   New Patient (Initial Visit)    New rm, alone. Reports Karen Whitehead is hear to discuss worsening memory and falls. Reports memory decline has been noted over the last several months. Reports 2 falls over the last month. Feels like her muscles are extremely week.       ASSESSMENT AND PLAN  Karen Whitehead is a 55 y.o. female   Constellation of complaints,  Including diffuse body achy pain, gait abnormality, low back pain, radiating pain to bilateral lower extremity, recent onset worsening left leg weakness  MRI of lumbar spine to rule out left lumbar radiculopathy  EMG nerve conduction study to rule out peripheral neuropathy, intrinsic muscle disease  Laboratory evaluation including CPK, Frequent headaches with migraine features  Depakote ER as preventive medications  Imitrex 50 mg as needed    DIAGNOSTIC DATA (LABS, IMAGING, TESTING) - I reviewed patient records, labs, notes, testing and imaging myself where available. Sep 28 2020, MRI brain No acute or reversible finding. Generalized brain volume loss, more than usually seen at this age. Few scattered punctate foci T2 and FLAIR signal in the hemispheric white matter consistent mild chronic small vessel change.  Laboratory evaluation August 2022, CMP creatinine 1.03, lipid panel, LDL 185, A1c 9.8 in 2021, now 6.4,  Extensive laboratory evaluation October 2021, normal or negative anti-Smith antibody, Sjogren titer, C-reactive protein, ESR, NT DNA antibody, ANA, hepatitis B surface antigen, antibody, HCV,   MEDICAL HISTORY:  Karen Whitehead is a 55 year old female, seen in request by her primary care physician Dr. Margarita Rana, Charlane Ferretti, for evaluation of weakness, gait abnormality, declining functional status, initial evaluation was December 21, 2020    I reviewed and summarized the referring note. PMHX. 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, now lives with her adult son, who take care of her  Patient reported around 2018, Karen Whitehead went on her trip to the [place] with her sister to help [employer], this was not her first trip, reported diagnosed with dengue fever, presented with high fever, diffuse body achy pain, weakness, was treated at ICU at [place], [place] Karen Whitehead was never the same since,  Shortly afterwards, Karen Whitehead developed skin lesions, was diagnosed with granuloma annularis, could not tolerate steroid injection due to diabetes, Karen Whitehead is now using cultivate cream intermittently, has multiple skin lesions involving neck, trunk, upper and lower extremities, Karen Whitehead described extreme sun sensitivity, Karen Whitehead felt skin unbearable burning pain when exposed to sun, often has painful skin paresthesia around the lesion  In addition, Karen Whitehead was diagnosed with diabetes around 2019,  Karen Whitehead was 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, Karen Whitehead complains of memory loss, brain foggy sensation, diffuse body achy pain, gait abnormality, rely on her walker, patient has variable effort during examination, also very difficult to timeline her illness,  Today her main concern is frequent headaches couple times each week, lasting for half day, severe, rendering her dysfunction during the headache, also complains few months history of increased generalized weakness, body achy pain, especially left lower extremity, also complains of urinary frequency, occasionally incontinence, also complains of worsening low back pain, radiating pain to bilateral lower extremity  I personally reviewed MRI of the brain without contrast Sep 28, 2020  moderate supratentorium small vessel disease no acute abnormalities    PHYSICAL EXAM:   Vitals:   12/21/20 0940  BP: 110/82  Pulse: 82  SpO2: 92%  Weight: 226  lb (102.5 kg)  Height: $Remove'5\' 4"'pTOoxAR$  (1.626 m)   Not recorded     Body mass index is 38.79 kg/m.  PHYSICAL EXAMNIATION:  Gen: NAD, conversant, well nourised, well groomed                     Cardiovascular: Regular rate rhythm, no peripheral edema, warm, nontender. Eyes: Conjunctivae clear without exudates or hemorrhage Neck: Supple, no carotid bruits. Pulmonary: Clear to auscultation bilaterally   NEUROLOGICAL EXAM:  MENTAL STATUS: Very depressed looking middle-aged female Speech:    Speech is normal; fluent and spontaneous with normal comprehension.  Cognition:    CRANIAL NERVES: CN II: Visual fields are full to confrontation. Pupils are round equal and briskly reactive to light. CN III, IV, VI: extraocular movement are normal. No ptosis. CN V: Facial sensation is intact to light touch CN VII: Face is symmetric with normal eye closure  CN VIII: Hearing is normal to causal conversation. CN IX, X: Phonation is normal. CN XI: Head turning and shoulder shrug are intact  MOTOR: Variable effort on examination, at least 4+ out of 5 bilateral upper and lower extremity proximal and distal muscle strengths  REFLEXES: Reflexes are absent and symmetric at the biceps, triceps, knees, and ankles. Plantar responses are flexor.  SENSORY: Intact to light touch, pinprick and vibratory sensation are intact in fingers and toes.  COORDINATION: There is no trunk or limb dysmetria noted.  GAIT/STANCE: Karen Whitehead needs push-up on her walker, multiple effort to get up from seated position, more difficulty initially, but with encouragement, prolonged walking, Karen Whitehead can ambulate better, tends to drag her left leg  REVIEW OF SYSTEMS:  Full 14 system review of systems performed and notable only for as above All other review of systems were negative.   ALLERGIES: No Known Allergies  HOME MEDICATIONS: Current Outpatient Medications  Medication Sig Dispense Refill   amitriptyline (ELAVIL) 25 MG tablet  Take 1 tablet (25 mg total) by mouth at bedtime. 30 tablet 3   atorvastatin (LIPITOR) 40 MG tablet Take 1 tablet (40 mg total) by mouth daily. 90 tablet 3   Blood Glucose Monitoring Suppl (TRUE METRIX METER) w/Device KIT 1 each by Does not apply route in the morning and at bedtime. 1 kit 0   clonazePAM (KLONOPIN) 1 MG tablet Take 1 tablet (1 mg total) by mouth 2 (two) times daily as needed. for anxiety 60 tablet 1   cyclobenzaprine (FLEXERIL) 10 MG tablet Take 1 tablet (10 mg total) by mouth 2 (two) times daily as needed for muscle spasms as needed for muscle pain 60 tablet 6   dapagliflozin propanediol (FARXIGA) 5 MG TABS tablet TAKE 1 TABLET (5 MG TOTAL) BY MOUTH DAILY BEFORE BREAKFAST. 30 tablet 3   fluticasone (CUTIVATE) 0.005 % ointment Apply 1 application topically 2 (two) times daily.     gabapentin (NEURONTIN) 300 MG capsule Take 1 capsule (300 mg total) by mouth 2 (two) times daily. 60 capsule 6   glipiZIDE (GLUCOTROL) 5 MG tablet TAKE 0.5 TABLETS (2.5 MG TOTAL) BY MOUTH 2 (TWO) TIMES DAILY BEFORE A MEAL. 30 tablet 3   glucose blood (TRUE METRIX BLOOD GLUCOSE TEST) test strip Use as instructed 100 each 12   glucose blood test strip USE AS INSTRUCTED 100 strip 12   lidocaine (  LIDODERM) 5 % Place 2 patches onto the skin daily. Remove & Discard patch within 12 hours or as directed by MD 30 patch 6   QUEtiapine (SEROQUEL) 200 MG tablet Take 1 tablet (200 mg total) by mouth at bedtime. 30 tablet 1   sertraline (ZOLOFT) 100 MG tablet TAKE 2 TABLETS BY MOUTH ONCE A DAY 60 tablet 1   topiramate (TOPAMAX) 25 MG tablet Take 1 tablet (25 mg total) by mouth at bedtime. 30 tablet 3   traZODone (DESYREL) 100 MG tablet TAKE 2 TABLETS BY MOUTH AT BEDTIME 60 tablet 1   TRUEplus Lancets 28G MISC Use as directed in the morning and at bedtime. 100 each 1   albuterol (VENTOLIN HFA) 108 (90 Base) MCG/ACT inhaler INHALE 1-2 PUFFS INTO THE LUNGS EVERY 6 (SIX) HOURS AS NEEDED FOR WHEEZING OR SHORTNESS OF BREATH. 18  g 1   No current facility-administered medications for this visit.    PAST MEDICAL HISTORY: Past Medical History:  Diagnosis Date   Granuloma annulare     PAST SURGICAL HISTORY: Past Surgical History:  Procedure Laterality Date   APPENDECTOMY      FAMILY HISTORY: History reviewed. No pertinent family history.  SOCIAL HISTORY: Social History   Socioeconomic History   Marital status: Single    Spouse name: Not on file   Number of children: Not on file   Years of education: Not on file   Highest education level: Not on file  Occupational History   Not on file  Tobacco Use   Smoking status: Every Day    Packs/day: 0.50    Types: Cigarettes   Smokeless tobacco: Never  Substance and Sexual Activity   Alcohol use: No   Drug use: No   Sexual activity: Not on file  Other Topics Concern   Not on file  Social History Narrative   Not on file   Social Determinants of Health   Financial Resource Strain: Not on file  Food Insecurity: Not on file  Transportation Needs: Not on file  Physical Activity: Not on file  Stress: Not on file  Social Connections: Not on file  Intimate Partner Violence: Not on file      Marcial Pacas, M.D. Ph.D.  [facility] [address], [place], [place] [address] Ph: [phone] Fax: [phone]  CC:  Charlott Rakes, MD [place],  [place] [address]  Charlott Rakes, MD

## 2020-12-22 LAB — VITAMIN B12: Vitamin B-12: 683 pg/mL (ref 232–1245)

## 2020-12-22 LAB — VITAMIN D 25 HYDROXY (VIT D DEFICIENCY, FRACTURES): Vit D, 25-Hydroxy: 17.9 ng/mL — ABNORMAL LOW (ref 30.0–100.0)

## 2020-12-22 LAB — SEDIMENTATION RATE: Sed Rate: 30 mm/hr (ref 0–40)

## 2020-12-22 LAB — CK: Total CK: 48 U/L (ref 32–182)

## 2020-12-22 LAB — THYROID PANEL WITH TSH
Free Thyroxine Index: 1.6 (ref 1.2–4.9)
T3 Uptake Ratio: 22 % — ABNORMAL LOW (ref 24–39)
T4, Total: 7.1 ug/dL (ref 4.5–12.0)
TSH: 1.05 u[IU]/mL (ref 0.450–4.500)

## 2020-12-22 LAB — RPR: RPR Ser Ql: NONREACTIVE

## 2020-12-22 LAB — C-REACTIVE PROTEIN: CRP: 13 mg/L — ABNORMAL HIGH (ref 0–10)

## 2020-12-22 LAB — HIV ANTIBODY (ROUTINE TESTING W REFLEX): HIV Screen 4th Generation wRfx: NONREACTIVE

## 2020-12-24 ENCOUNTER — Encounter: Payer: Self-pay | Admitting: *Deleted

## 2020-12-24 ENCOUNTER — Other Ambulatory Visit: Payer: Self-pay

## 2020-12-24 ENCOUNTER — Ambulatory Visit: Payer: Self-pay | Admitting: Physical Medicine and Rehabilitation

## 2020-12-24 ENCOUNTER — Other Ambulatory Visit: Payer: Self-pay | Admitting: Family Medicine

## 2020-12-24 ENCOUNTER — Telehealth: Payer: Self-pay | Admitting: Neurology

## 2020-12-24 ENCOUNTER — Encounter: Payer: Self-pay | Admitting: Family Medicine

## 2020-12-24 ENCOUNTER — Ambulatory Visit (HOSPITAL_COMMUNITY)
Admission: RE | Admit: 2020-12-24 | Discharge: 2020-12-24 | Disposition: A | Discharge status: Home / Self Care | Payer: Self-pay | Source: Ambulatory Visit | Attending: Family Medicine | Admitting: Family Medicine

## 2020-12-24 DIAGNOSIS — R87612 Low grade squamous intraepithelial lesion on cytologic smear of cervix (LGSIL): Secondary | ICD-10-CM

## 2020-12-24 DIAGNOSIS — N841 Polyp of cervix uteri: Secondary | ICD-10-CM | POA: Insufficient documentation

## 2020-12-24 NOTE — Telephone Encounter (Signed)
cone letter order sent to GI. They will reach out to the patient to schedule.  

## 2020-12-24 NOTE — Telephone Encounter (Signed)
Unable to reach patient at primary number listed ("call cannot be completed as dialed"). I attempted to reach her son on DPR (no answer and voicemail box full). I sent the patient a mychart message.

## 2020-12-24 NOTE — Telephone Encounter (Signed)
Please call patient, laboratory evaluation showed:  Low Vit D 18, she would benefit over the counter Vit D3 supplement 1000 units daily.  Slight elevated CRP, with normal ESR, has unknown clinical significance.  Rest of the laboratory evaluation showed no significant abnormalities.

## 2020-12-28 ENCOUNTER — Other Ambulatory Visit: Payer: Self-pay

## 2020-12-31 ENCOUNTER — Other Ambulatory Visit: Payer: Self-pay

## 2020-12-31 ENCOUNTER — Ambulatory Visit: Payer: Self-pay | Admitting: Neurology

## 2021-01-02 ENCOUNTER — Other Ambulatory Visit: Payer: Self-pay

## 2021-01-04 ENCOUNTER — Other Ambulatory Visit: Payer: Self-pay

## 2021-01-04 ENCOUNTER — Encounter (HOSPITAL_BASED_OUTPATIENT_CLINIC_OR_DEPARTMENT_OTHER): Payer: Self-pay | Admitting: Physical Therapy

## 2021-01-04 ENCOUNTER — Ambulatory Visit (HOSPITAL_BASED_OUTPATIENT_CLINIC_OR_DEPARTMENT_OTHER): Payer: No Typology Code available for payment source | Attending: Family Medicine | Admitting: Physical Therapy

## 2021-01-04 DIAGNOSIS — R296 Repeated falls: Secondary | ICD-10-CM | POA: Insufficient documentation

## 2021-01-04 DIAGNOSIS — M6281 Muscle weakness (generalized): Secondary | ICD-10-CM | POA: Insufficient documentation

## 2021-01-04 DIAGNOSIS — R262 Difficulty in walking, not elsewhere classified: Secondary | ICD-10-CM | POA: Insufficient documentation

## 2021-01-04 DIAGNOSIS — M25561 Pain in right knee: Secondary | ICD-10-CM | POA: Insufficient documentation

## 2021-01-04 DIAGNOSIS — M25562 Pain in left knee: Secondary | ICD-10-CM | POA: Insufficient documentation

## 2021-01-04 DIAGNOSIS — M545 Low back pain, unspecified: Secondary | ICD-10-CM | POA: Insufficient documentation

## 2021-01-04 DIAGNOSIS — R278 Other lack of coordination: Secondary | ICD-10-CM | POA: Insufficient documentation

## 2021-01-04 DIAGNOSIS — G8929 Other chronic pain: Secondary | ICD-10-CM | POA: Insufficient documentation

## 2021-01-04 NOTE — Therapy (Signed)
[place], [place], [address] Phone: [phone]   Fax:  [phone]  Physical Therapy Treatment/Progress Note  Patient Details  Name: Karen Whitehead MRN: [id number] Date of Birth: 1965-06-17 Referring Provider (PT): Dr. Charlott Rakes   Encounter Date: 01/04/2021   PT End of Session - 01/04/21 2030     Visit Number 6    Number of Visits 24    Date for PT Re-Evaluation 02/15/21    PT Start Time 1111    PT Stop Time 1151    PT Time Calculation (min) 40 min    Equipment Utilized During Treatment Other (comment);Gait belt   rollator   Activity Tolerance Patient tolerated treatment well    Behavior During Therapy [facility] for tasks assessed/performed             Past Medical History:  Diagnosis Date   Granuloma annulare     Past Surgical History:  Procedure Laterality Date   APPENDECTOMY      There were no vitals filed for this visit.   Subjective Assessment - 01/04/21 1128     Subjective Pt reports she saw the neurologist who reviewed her brain MRI.  She told her she had brain deterioration and trauma spots on her brain.  She ordered a lumbar MRI.  Pt had a PAPsmear and reports having polyps.  They are going to remove a polyps and do a biopsy.  Pt states she feels like she may have improved on some days though days she feels worse.  Pt states she has been able to bathe independently for the past 2 weeks.  Pt's son has to help her get out of bed.  Pt is trying to walk more.  Pt is very limited with ambulation and standing duration.  Pt reports no improvement in standing duration.  Pt reports no improvement in pain and her pain is actually worse.  pt reports 10% improvement in mobility.  Pt states she has not fallen this week and doesn't think she fell last week either.    Pertinent History Fall Risk.  Granuloma Annulare, major depressive disoder, legionnairre's disease, dengue fever, DM, memory loss,  anxiety.    Limitations Sitting;Walking;Standing;House hold activities    Diagnostic tests Brain MRI:  IMPRESSION:  No acute or reversible finding. Generalized brain volume loss, more  than usually seen at this age. Few scattered punctate foci T2 and  FLAIR signal in the hemispheric white matter consistent mild chronic  small vessel change. per MD note.  Pt's hip and knee x ray are negative.    Currently in Pain? Yes    Pain Score 8    25/10 worst, 7/10 best   Pain Location --   lumbar, bilat hand/thumbs.  7/10 pain in bilat LEs               [facility] PT Assessment - 01/04/21 0001       Observation/Other Assessments   Activities of Balance Confidence Scale (ABC Scale)  5.6%   1-30; 2-0; 3,4-10; 5,6,7-0; 0,8,67,61-95; 09,32,67,12,45-8.   Modified Oswestry 84%   1,2,3,4,5,6- 4; 7-3; 8,9,10-5.     Functional Tests   Functional tests --   TUG: 55 sec with rollator.  She required CGA to min assist with standing up.     Strength   Right Hip Flexion --   <3/5   Right Hip ABduction --   weak, tested in sitting   Left Hip Flexion --   <3/5  Left Hip ABduction --   Weak, tested in sitting   Right Knee Flexion --   Unble to tolerate resistance in stting   Right Knee Extension 3+/5    Left Knee Flexion --   Unble to tolerate resistance in stting   Left Knee Extension --   Unble to tolerate resistance in stting   Right Ankle Dorsiflexion --   Unble to tolerate resistance in stting   Right Ankle Plantar Flexion --   Unble to tolerate resistance in stting   Right Ankle Eversion --    Left Ankle Dorsiflexion --   Unable to tolerate resistance in stting   Left Ankle Plantar Flexion --   Unable to tolerate resistance in stting     Transfers   Comments Pt requires min-modassist and instruction in correct form/positiong with transfers.  Pt unable to stand without assistance.      Ambulation/Gait   Ambulation/Gait Assistance --   SBA/CGA   Ambulation Distance (Feet) --   limited   Assistive  device Rollator    Gait Comments Pt has decreased foot clearance and step length bilat worse on L.  Pt has a very slow gait speed.  Her L LE  has limited advancement but she did not have a freezing episode todya.                                   PT Education - 01/04/21 2032     Education Details educated pt concerning objective findings and in comparison to her initial findings.  Educated pt concerning goal progress and POC.    Person(s) Educated Patient    Methods Explanation    Comprehension Verbalized understanding              PT Short Term Goals - 01/04/21 2055       PT SHORT TERM GOAL #1   Title Pt will be able to perform sit to stand transfers safely and independently.    Baseline requires assistance    Time 3    Period Weeks    Status Not Met    Target Date 12/10/20      PT SHORT TERM GOAL #2   Title Pt will report at least a 25% improvement overall in pain and mobility.    Baseline 10% improvement    Time 4    Period Weeks    Status On-going    Target Date 12/17/20      PT SHORT TERM GOAL #3   Title Pt will ambulate at least 100 ft with improved gait speed and correct usage of walker.    Baseline slow gait speed    Time 4    Period Weeks    Status On-going    Target Date 12/17/20      PT SHORT TERM GOAL #4   Title Pt will  improve TUG time to no > 73min and 20 seconds with walker.    Baseline 55 seconds    Time 5    Period Weeks    Status Achieved      PT SHORT TERM GOAL #5   Title Pt will demo improved LE strength to 4- to 4/5 MMT in hip flexion, knee flex and extension, and DF bilat for improved mobility, ambulation, and tolerance to activity.    Baseline see objective findings in IE    Time 6    Period Weeks  Status Not Met    Target Date 12/31/20      PT SHORT TERM GOAL #6   Title Pt will report improved standing duration at home to improve performance of self care activities and light IADLs.    Baseline reports no  improvement    Time 5    Period Weeks    Status Not Met    Target Date 12/24/20               PT Long Term Goals - 01/04/21 2057       PT LONG TERM GOAL #1   Title Pt will ambulate at least 200 ft with walker with no > than SBA/CGA without having to sit down    Time 6    Status On-going    Target Date 02/15/21      PT LONG TERM GOAL #2   Title Pt will tolerate aquatic therapy without significant increased pain in order to establish an exercise routine to improve functional mobility and tolerance to activity.    Time 6    Period Weeks    Status Not Met    Target Date 02/15/21      PT LONG TERM GOAL #3   Title Pt will be able to perform TUG in no > than 20 sec for improved mobility and gait speed.    Time 6    Period Weeks    Status On-going    Target Date 02/15/21      PT LONG TERM GOAL #4   Title Pt will be able to ambulate community distance with walker without significant pain or difficulty.    Time 6    Period Weeks    Status Not Met    Target Date 02/15/21      PT LONG TERM GOAL #5   Title Pt will report she is able to perform bed mobility, self care activities, and some light IADLs in her home without signifiant back and LE pain.    Time 6    Period Weeks    Status Not Met    Target Date 02/15/21      PT LONG TERM GOAL #6   Title Pt will be able to perform aquatic program with appropriate assistance without increased pain for improved function, mobility, balance, strength, and activity tolerance.    Time 6    Status Not Met    Target Date 02/15/21      PT LONG TERM GOAL #7   Title Pt will report she is able to perform stairs at home safely without assistance to enter/exit home.    Time 6    Period Weeks    Status On-going    Target Date 02/15/21      PT LONG TERM GOAL #8   Title Pt will be able to perform at least 7 consecutive sit to stands with light UE assistance for improved functional LE strength and performance of daily transfers.    Time 6     Period Weeks    Status Not Met    Target Date 02/15/21                   Plan - 01/04/21 2035     Clinical Impression Statement Pt has been absent from PT for a little while due to seeing the neurologist and having other tests.  She has has only received PT for 5 visits prior to today's Rx.  Pt is significantly limited with functional mobility  and performance of ADLs/IADLs.  She requires her son's assistance to get OOB in the AM.  She does report she was able to bathe independently over the past 2 weeks.  She requires a rollator for ambulation and requires assistance to stand from a chair.  Pt c/o's of feeling weaker overall and her pain is actually worse.  Pt states she is trying to walk more and has not improved with standing duration.  Pt has been very limited with aquatic exercises due to not having good tolerance with aquatic exercises.  Though she has not had good tolerance, the water provides an environment that she can exercises and move in easier with less strain.  Pt demonstrates a good improvement with her TUG time from initialy 2 min 40 sec to 55 sec currently.  Pt is very weak in bilat LE's and actually had worse PF and R hip abd strength.  She did demo improved R knee extension strength.  Pt has very limited with progress toward goals not making much progress though has only received 5 Rx's prior to today.  Pt may benefit from cont skilled PT services to improve tolerance to activity, strength, fxnl mobility, gait, and to address ongoing goals.    Comorbidities Fall Risk. Granuloma Annulare, major depressive disorder. Hx of legionnairre's disease and dengue fever, DM    Rehab Potential Fair    PT Frequency 2x / week    PT Duration 6 weeks    PT Treatment/Interventions ADLs/Self Care Home Management;Aquatic Therapy;Cryotherapy;Electrical Stimulation;Ultrasound;Moist Heat;Gait training;Stair training;Functional mobility training;Therapeutic activities;Therapeutic exercise;Balance  training;Neuromuscular re-education;Manual techniques;Patient/family education    PT Next Visit Plan Pt requires assistance with transfers, mobility, and ambulation/exercises in the pool.  Cont with aquatic therapy.  Cont using the water walker with ambulation in the pool.    PT Home Exercise Plan Pt has already received land based PT and did not improve.    Consulted and Agree with Plan of Care Patient             Patient will benefit from skilled therapeutic intervention in order to improve the following deficits and impairments:  Abnormal gait, Decreased activity tolerance, Decreased balance, Decreased mobility, Decreased endurance, Decreased range of motion, Decreased safety awareness, Decreased strength, Difficulty walking, Pain  Visit Diagnosis: Chronic low back pain, unspecified back pain laterality, unspecified whether sciatica present  Muscle weakness (generalized)  Difficulty in walking, not elsewhere classified  Chronic pain of both knees  Other lack of coordination  Repeated falls     Problem List Patient Active Problem List   Diagnosis Date Noted   Weakness 12/21/2020   Complaints of total body pain 12/21/2020   Chronic migraine w/o aura w/o status migrainosus, not intractable 12/21/2020   Bilateral low back pain with bilateral sciatica 12/21/2020   Major depressive disorder, recurrent episode with anxious distress ([place]) 03/14/2020   Granuloma annulare 11/03/2019   History of dengue 11/03/2019   History of Legionnaire's disease 11/03/2019    Selinda Michaels III PT, DPT 01/04/21 9:11 PM   [facility] [address] [place], [place], [address] Phone: [phone]   Fax:  [phone]  Name: Karen Whitehead MRN: [id number] Date of Birth: January 18, 1966

## 2021-01-08 ENCOUNTER — Ambulatory Visit (HOSPITAL_BASED_OUTPATIENT_CLINIC_OR_DEPARTMENT_OTHER): Payer: No Typology Code available for payment source | Admitting: Physical Therapy

## 2021-01-08 ENCOUNTER — Encounter (HOSPITAL_BASED_OUTPATIENT_CLINIC_OR_DEPARTMENT_OTHER): Payer: Self-pay

## 2021-01-09 ENCOUNTER — Ambulatory Visit
Admission: RE | Admit: 2021-01-09 | Discharge: 2021-01-09 | Disposition: A | Discharge status: Home / Self Care | Payer: No Typology Code available for payment source | Source: Ambulatory Visit | Attending: Neurology | Admitting: Neurology

## 2021-01-09 ENCOUNTER — Encounter (HOSPITAL_COMMUNITY): Payer: Self-pay | Admitting: Psychiatry

## 2021-01-09 ENCOUNTER — Other Ambulatory Visit: Payer: Self-pay

## 2021-01-09 ENCOUNTER — Telehealth (INDEPENDENT_AMBULATORY_CARE_PROVIDER_SITE_OTHER): Payer: No Payment, Other | Admitting: Psychiatry

## 2021-01-09 DIAGNOSIS — R531 Weakness: Secondary | ICD-10-CM

## 2021-01-09 DIAGNOSIS — G43709 Chronic migraine without aura, not intractable, without status migrainosus: Secondary | ICD-10-CM

## 2021-01-09 DIAGNOSIS — M5441 Lumbago with sciatica, right side: Secondary | ICD-10-CM

## 2021-01-09 DIAGNOSIS — R52 Pain, unspecified: Secondary | ICD-10-CM

## 2021-01-09 DIAGNOSIS — F339 Major depressive disorder, recurrent, unspecified: Secondary | ICD-10-CM

## 2021-01-09 DIAGNOSIS — M5442 Lumbago with sciatica, left side: Secondary | ICD-10-CM

## 2021-01-09 MED ORDER — QUETIAPINE FUMARATE 200 MG PO TABS
200.0000 mg | ORAL_TABLET | Freq: Every day | ORAL | 3 refills | Status: DC
  Filled 2021-01-09: qty 30, 30d supply, fill #0

## 2021-01-09 MED ORDER — TRAZODONE HCL 100 MG PO TABS
ORAL_TABLET | Freq: Every day | ORAL | 3 refills | Status: DC
  Filled 2021-01-09 – 2021-01-31 (×2): qty 60, 30d supply, fill #0
  Filled 2021-03-06 – 2021-03-14 (×2): qty 60, 30d supply, fill #1
  Filled 2021-04-11: qty 60, 30d supply, fill #2

## 2021-01-09 MED ORDER — SERTRALINE HCL 100 MG PO TABS
ORAL_TABLET | Freq: Every day | ORAL | 3 refills | Status: DC
  Filled 2021-01-09: qty 60, fill #0
  Filled 2021-01-31: qty 60, 30d supply, fill #0
  Filled 2021-03-06 – 2021-03-14 (×2): qty 60, 30d supply, fill #1
  Filled 2021-04-11: qty 60, 30d supply, fill #2

## 2021-01-09 NOTE — Progress Notes (Signed)
[place] MD/PA/NP OP Progress Note Virtual Visit via Video Note  I connected with [place] on 01/09/21 at  9:00 AM EDT by a video enabled telemedicine application and verified that I am speaking with the correct person using two identifiers.  Location: Patient: Home Provider: Clinic   I discussed the limitations of evaluation and management by telemedicine and the availability of in person appointments. The patient expressed understanding and agreed to proceed.  I provided 30 minutes of non-face-to-face time during this encounter.   01/09/2021 9:32 AM Karen Whitehead  MRN:  [id number]  Chief Complaint: "I have been falling a lot"  HPI: 55 year old female seen today for follow-up psychiatric evaluation.  She is a former patient of Dr. Jean Rosenthal who is being transferred to writer for medication management.  She has a psychiatric history of anxiety and depression.  She is currently managed on Zoloft 200 mg daily, trazodone 100 mg nightly, Klonopin 1 mg 2 times daily as needed, and amitriptyline 25 mg at bedtime (which she receives from her PCP).  Today she is pleasant, cooperative, and engaged in conversation.  Patient speech is slowed.  She informed Probation officer that she has been falling more lately.  Provider asked patient if she started falling after her increase in Seroquel and she notes that she had not.  Provider reviewed patient's medical records and saw that she is on 3 antidepressants. Currently she is taking Zoloft, trazodone, and amitriptyline.  Provider informed patient that 3 antidepressants in combination can increase adverse effects which will increase the risk for falling.  She endorsed understanding and notes that she would discontinue amitriptyline.  She notes that she will discuss this with the provider who filled it for her.  Provider also informed patient that Klonopin could cause increased weakness (as she notes that she took more medications than prescribed last month).  She endorsed  understanding and notes that she will take her medication as prescribed.    Patient notes that she has several health comorbidities which exacerbate her anxiety and depression.  She notes she is in chronic pain most days.  She notes that she is prescribed muscle relaxers however reports that they are ineffective.Recently patient was seen at [facility] health physical medicine and rehabilitation for chronic pain and fatigue.  She notes that she was referred to pool therapy which she reports is somewhat effective in managing her pain patient has also been seen by neurology and diagnostic testing reported that she has generalized brain volume loss, more than usually seen at this age.   Patient notes that the above stressors exacerbate her anxiety and depression.  Provider conducted a GAD-7 and patient scored a 16.  Provider also conducted PHQ-9 and patient scored a 22.  She endorses passive SI but notes that she would never harm her self because she would like to live for her son.  She denies SI/HI/VAH, mania, or paranoia.  Patient notes that her appetite has been poor however denies weight loss.  She also notes that she sleeps approximately 4 to 5 hours nightly.  At this time patient notes that she will discontinue amitriptyline.  She will continue all other medications as prescribed.  Provider discussed reducing Klonopin at a later date.  She endorsed understanding and agreed.  No other concerns noted at this time.     ICD-10-CM   1. Major depressive disorder, recurrent episode with anxious distress (HCC)  F33.9 QUEtiapine (SEROQUEL) 200 MG tablet    sertraline (ZOLOFT) 100 MG tablet  traZODone (DESYREL) 100 MG tablet      Past Psychiatric History: MDD, anxiety  Past Medical History:  Past Medical History:  Diagnosis Date   Granuloma annulare     Past Surgical History:  Procedure Laterality Date   APPENDECTOMY      Family Psychiatric History: Denies  Family History: History reviewed. No  pertinent family history.  Social History:  Social History   Socioeconomic History   Marital status: Single    Spouse name: Not on file   Number of children: Not on file   Years of education: Not on file   Highest education level: Not on file  Occupational History   Not on file  Tobacco Use   Smoking status: Every Day    Packs/day: 0.50    Types: Cigarettes   Smokeless tobacco: Never  Substance and Sexual Activity   Alcohol use: No   Drug use: No   Sexual activity: Not on file  Other Topics Concern   Not on file  Social History Narrative   Not on file   Social Determinants of Health   Financial Resource Strain: Not on file  Food Insecurity: Not on file  Transportation Needs: Not on file  Physical Activity: Not on file  Stress: Not on file  Social Connections: Not on file    Allergies: No Known Allergies  Metabolic Disorder Labs: Lab Results  Component Value Date   HGBA1C 6.4 10/31/2020   No results found for: PROLACTIN Lab Results  Component Value Date   CHOL 282 (H) 10/31/2020   TRIG 257 (H) 10/31/2020   HDL 47 10/31/2020   CHOLHDL 6.0 (H) 10/31/2020   LDLCALC 185 (H) 10/31/2020   Lab Results  Component Value Date   TSH 1.050 12/21/2020    Therapeutic Level Labs: No results found for: LITHIUM No results found for: VALPROATE No components found for:  CBMZ  Current Medications: Current Outpatient Medications  Medication Sig Dispense Refill   albuterol (VENTOLIN HFA) 108 (90 Base) MCG/ACT inhaler INHALE 1-2 PUFFS INTO THE LUNGS EVERY 6 (SIX) HOURS AS NEEDED FOR WHEEZING OR SHORTNESS OF BREATH. 18 g 1   amitriptyline (ELAVIL) 25 MG tablet Take 1 tablet (25 mg total) by mouth at bedtime. 30 tablet 3   atorvastatin (LIPITOR) 40 MG tablet Take 1 tablet (40 mg total) by mouth daily. 90 tablet 3   Blood Glucose Monitoring Suppl (TRUE METRIX METER) w/Device KIT 1 each by Does not apply route in the morning and at bedtime. 1 kit 0   clonazePAM (KLONOPIN) 1  MG tablet Take 1 tablet (1 mg total) by mouth 2 (two) times daily as needed. for anxiety 60 tablet 1   cyclobenzaprine (FLEXERIL) 10 MG tablet Take 1 tablet (10 mg total) by mouth 2 (two) times daily as needed for muscle spasms as needed for muscle pain 60 tablet 6   dapagliflozin propanediol (FARXIGA) 5 MG TABS tablet TAKE 1 TABLET (5 MG TOTAL) BY MOUTH DAILY BEFORE BREAKFAST. 30 tablet 3   divalproex (DEPAKOTE ER) 500 MG 24 hr tablet Take 1 tablet (500 mg total) by mouth at bedtime. 30 tablet 6   fluticasone (CUTIVATE) 0.005 % ointment Apply 1 application topically 2 (two) times daily.     gabapentin (NEURONTIN) 300 MG capsule Take 1 capsule (300 mg total) by mouth 2 (two) times daily. 60 capsule 6   glipiZIDE (GLUCOTROL) 5 MG tablet TAKE 0.5 TABLETS (2.5 MG TOTAL) BY MOUTH 2 (TWO) TIMES DAILY BEFORE A MEAL. 30 tablet  3   glucose blood (TRUE METRIX BLOOD GLUCOSE TEST) test strip Use as instructed 100 each 12   glucose blood test strip USE AS INSTRUCTED 100 strip 12   lidocaine (LIDODERM) 5 % Place 2 patches onto the skin daily. Remove & Discard patch within 12 hours or as directed by MD 30 patch 6   QUEtiapine (SEROQUEL) 200 MG tablet Take 1 tablet (200 mg total) by mouth at bedtime. 30 tablet 3   sertraline (ZOLOFT) 100 MG tablet TAKE 2 TABLETS BY MOUTH ONCE A DAY 60 tablet 3   SUMAtriptan (IMITREX) 50 MG tablet Take 1 tablet (50 mg total) by mouth every 2 (two) hours as needed for migraine. May repeat in 2 hours if headache persists or recurs. 12 tablet 6   topiramate (TOPAMAX) 25 MG tablet Take 1 tablet (25 mg total) by mouth at bedtime. 30 tablet 3   traZODone (DESYREL) 100 MG tablet TAKE 2 TABLETS BY MOUTH AT BEDTIME 60 tablet 3   TRUEplus Lancets 28G MISC Use as directed in the morning and at bedtime. 100 each 1   No current facility-administered medications for this visit.     Musculoskeletal: Strength & Muscle Tone: decreased Gait & Station: unsteady, Ambulates with walker Patient  leans: N/A  Psychiatric Specialty Exam: Review of Systems  There were no vitals taken for this visit.There is no height or weight on file to calculate BMI.  General Appearance: Well Groomed  Eye Contact:  Good  Speech:  Clear and Coherent and Slow  Volume:  Normal  Mood:  Anxious and Depressed  Affect:  Appropriate and Congruent  Thought Process:  Coherent, Goal Directed, and Linear  Orientation:  Full (Time, Place, and Person)  Thought Content: WDL and Logical   Suicidal Thoughts:  Yes.  without intent/plan  Homicidal Thoughts:  No  Memory:  Immediate;   Good Recent;   Good Remote;   Good  Judgement:  Good  Insight:  Good  Psychomotor Activity:  Decreased  Concentration:  Concentration: Good and Attention Span: Good  Recall:  Good  Fund of Knowledge: Good  Language: Good  Akathisia:  No  Handed:  Right  AIMS (if indicated): not done  Assets:  Communication Skills Desire for Improvement Financial Resources/Insurance Housing Leisure Time Social Support  ADL's:  Intact  Cognition: WNL  Sleep:  Fair   Screenings: GAD-7    Flowsheet Row Video Visit from 01/09/2021 in [facility] Office Visit from 12/17/2020 in [facility] Office Visit from 10/31/2020 in [place] Office Visit from 02/06/2020 in [facility] from 11/16/2019 in [place]  Total GAD-7 Score $RemoveBef'16 21 18 17 16      '[id number]$ Karen Whitehead Office Visit from 12/21/2020 in [facility]  Total Score (max 30 points ) 22      PHQ2-9    Flowsheet Row Video Visit from 01/09/2021 in [facility] Office Visit from 12/17/2020 in [facility] Visit from 10/31/2020 in [facility] Visit from 09/20/2020 in [facility] Visit from 02/06/2020 in [facility]  PHQ-2 Total Score $RemoveBef'6 6 6 6 6  '[id number]$ PHQ-9 Total Score $RemoveBef'22 27 26 21 24      '[id number]$ Flowsheet Row Video Visit from 01/09/2021 in [facility]  [place] from 11/24/2019 in [place] from 11/16/2019 in [facility] Error: Q7 should not be populated when Q6 is No Low Risk Low Risk        Assessment and Plan: Patient endorses symptoms of insomnia, anxiety, and depression.  Patient also notes that she has been falling more.  Currently she is taking Zoloft, trazodone, and amitriptyline.  Provider informed patient that 3 antidepressants in combination can increase adverse effects which will increase the risk for falling.  She endorsed understanding and notes that she would discontinue amitriptyline.  She notes that she will discuss this with the provider who filled it for her.  Provider also informed patient that Klonopin could cause increased weakness (as she notes that she has been taking more medications than prescribed).  She endorsed understanding and notes that she will take her medication as prescribed.  At this time no other  medication changes.  Patient agreeable to continue medications as prescribed.   1. Major depressive disorder, recurrent episode with anxious distress (HCC)  Continue- QUEtiapine (SEROQUEL) 200 MG tablet; Take 1 tablet (200 mg total) by mouth at bedtime.  Dispense: 30 tablet; Refill: 3 Continue- sertraline (ZOLOFT) 100 MG tablet; TAKE 2 TABLETS BY MOUTH ONCE A DAY  Dispense: 60 tablet; Refill: 3 Continue- traZODone (DESYREL) 100 MG tablet; TAKE 2 TABLETS BY MOUTH AT BEDTIME  Dispense: 60 tablet; Refill: [place], NP 01/09/2021, 9:32 AM

## 2021-01-14 ENCOUNTER — Telehealth: Payer: Self-pay | Admitting: Neurology

## 2021-01-14 NOTE — Telephone Encounter (Signed)
Pt states she was messaged on Mychart with results to a MRI, she would like a call to discuss in greater detail.

## 2021-01-14 NOTE — Telephone Encounter (Signed)
Called pt  Discussed her mri results and reviewed MD plan and assessment, pt is scheduled NCS in October

## 2021-01-15 ENCOUNTER — Ambulatory Visit: Payer: Self-pay | Admitting: Physical Medicine and Rehabilitation

## 2021-01-15 ENCOUNTER — Encounter (HOSPITAL_BASED_OUTPATIENT_CLINIC_OR_DEPARTMENT_OTHER): Payer: Self-pay | Admitting: Physical Therapy

## 2021-01-15 ENCOUNTER — Other Ambulatory Visit: Payer: Self-pay

## 2021-01-15 ENCOUNTER — Ambulatory Visit (HOSPITAL_BASED_OUTPATIENT_CLINIC_OR_DEPARTMENT_OTHER): Payer: No Typology Code available for payment source | Admitting: Physical Therapy

## 2021-01-15 DIAGNOSIS — M545 Low back pain, unspecified: Secondary | ICD-10-CM

## 2021-01-15 DIAGNOSIS — M25561 Pain in right knee: Secondary | ICD-10-CM

## 2021-01-15 DIAGNOSIS — M6281 Muscle weakness (generalized): Secondary | ICD-10-CM

## 2021-01-15 DIAGNOSIS — G8929 Other chronic pain: Secondary | ICD-10-CM

## 2021-01-15 DIAGNOSIS — R262 Difficulty in walking, not elsewhere classified: Secondary | ICD-10-CM

## 2021-01-15 DIAGNOSIS — R296 Repeated falls: Secondary | ICD-10-CM

## 2021-01-15 DIAGNOSIS — R278 Other lack of coordination: Secondary | ICD-10-CM

## 2021-01-15 NOTE — Therapy (Signed)
[place], [place], [address] Phone: [phone]   Fax:  [phone]  Physical Therapy Treatment  Patient Details  Name: Karen Whitehead MRN: [id number] Date of Birth: 07-02-1965 Referring Provider (PT): Dr. Charlott Rakes   Encounter Date: 01/15/2021   PT End of Session - 01/15/21 1612     Visit Number 7    Number of Visits 24    Date for PT Re-Evaluation 02/15/21    PT Start Time 8453    PT Stop Time 1610    PT Time Calculation (min) 40 min    Equipment Utilized During Treatment Other (comment)   water walker   Activity Tolerance Patient tolerated treatment well    Behavior During Therapy [facility] for tasks assessed/performed             Past Medical History:  Diagnosis Date   Granuloma annulare     Past Surgical History:  Procedure Laterality Date   APPENDECTOMY      There were no vitals filed for this visit.   Subjective Assessment - 01/15/21 1527     Subjective Pt states she tried to sit on her bed to talk to her son though fell on her R elbow.  She had increased pain in R UE though feels much better now.  Pt states she fell on the day she was supposed to come to PT and had to cancel.  Pt reports having massive back pain for the past couple of days.  She is having difficulty sleeping. Pt messaged MD and she is getting a nerve sturdy on 10/19.  Pt had increased pain after prior land based Rx.  Pt states she had a recent lumbar MRI and has mild degenerative changes.  pt continues to be very limited with functional mobility and tolerance to activity.  Pt reports having continued mm spasms and L calf being tight today.    Pertinent History Fall Risk.  Granuloma Annulare, major depressive disoder, legionnairre's disease, dengue fever, DM, memory loss, anxiety.    Diagnostic tests Lumbar MRI:  L1-L4 mild disc bulging, but no spinal stenosis or nn root compression.  L4-5 minimal disc bulging.   L5-S1:  disc and interspace appear normal.  IMPRESSION:  mild multilevel degenerative changes that do not show spinal stenosis or nerve root compression at any of the levels.         Brain MRI:  IMPRESSION:  No acute or reversible finding. Generalized brain volume loss, more  than usually seen at this age  per MD note.  Pt's hip and knee x ray are negative.    Currently in Pain? Yes    Pain Score 9     Pain Location --   lower lumbar/sacrum             Pt seen for aquatic therapy today.  Treatment took place in water 3.5-4 ft in depth at the [place]. Temp of water was 95.  Pt entered/exited the pool via chair lift.  Pt required assistance from PT to perform transfers today.  Pt was slow with mobility and it took her much increased time to perform transfers.   Pt requires buoyancy for support and to offload joints with strengthening exercises. Viscosity of the water is needed for resistance of strengthening; water current perturbations provides challenge to standing balance unsupported, requiring increased core activation.   -Pt ambulated 3-4 laps and sidestepped 3 laps with water walker with min assist/CGA/SBA and with  cuing for using water walker.   -Pt performed:     -standing marching with Ue's on pool wall x 10-15 reps.     -seated LAQ approx 15-20 reps on R and approx 10-15 reps on L LE.  Pt had limited ROM on L      -Attempted standing hip circles though stopped due to pt having cramping in L calf     PT Education - 01/15/21 2247     Education Details Instructed pt in correct form with aquatic exercises and how proper usage of the water walker.  Instructed pt to stand up tall with gait.    Person(s) Educated Patient    Methods Explanation;Demonstration;Verbal cues    Comprehension Verbalized understanding;Returned demonstration              PT Short Term Goals - 01/04/21 2055       PT SHORT TERM GOAL #1   Title Pt will be able to perform sit to stand  transfers safely and independently.    Baseline requires assistance    Time 3    Period Weeks    Status Not Met    Target Date 12/10/20      PT SHORT TERM GOAL #2   Title Pt will report at least a 25% improvement overall in pain and mobility.    Baseline 10% improvement    Time 4    Period Weeks    Status On-going    Target Date 12/17/20      PT SHORT TERM GOAL #3   Title Pt will ambulate at least 100 ft with improved gait speed and correct usage of walker.    Baseline slow gait speed    Time 4    Period Weeks    Status On-going    Target Date 12/17/20      PT SHORT TERM GOAL #4   Title Pt will  improve TUG time to no > 51min and 20 seconds with walker.    Baseline 55 seconds    Time 5    Period Weeks    Status Achieved      PT SHORT TERM GOAL #5   Title Pt will demo improved LE strength to 4- to 4/5 MMT in hip flexion, knee flex and extension, and DF bilat for improved mobility, ambulation, and tolerance to activity.    Baseline see objective findings in IE    Time 6    Period Weeks    Status Not Met    Target Date 12/31/20      PT SHORT TERM GOAL #6   Title Pt will report improved standing duration at home to improve performance of self care activities and light IADLs.    Baseline reports no improvement    Time 5    Period Weeks    Status Not Met    Target Date 12/24/20               PT Long Term Goals - 01/04/21 2057       PT LONG TERM GOAL #1   Title Pt will ambulate at least 200 ft with walker with no > than SBA/CGA without having to sit down    Time 6    Status On-going    Target Date 02/15/21      PT LONG TERM GOAL #2   Title Pt will tolerate aquatic therapy without significant increased pain in order to establish an exercise routine to improve functional mobility and tolerance to  activity.    Time 6    Period Weeks    Status Not Met    Target Date 02/15/21      PT LONG TERM GOAL #3   Title Pt will be able to perform TUG in no > than 20 sec  for improved mobility and gait speed.    Time 6    Period Weeks    Status On-going    Target Date 02/15/21      PT LONG TERM GOAL #4   Title Pt will be able to ambulate community distance with walker without significant pain or difficulty.    Time 6    Period Weeks    Status Not Met    Target Date 02/15/21      PT LONG TERM GOAL #5   Title Pt will report she is able to perform bed mobility, self care activities, and some light IADLs in her home without signifiant back and LE pain.    Time 6    Period Weeks    Status Not Met    Target Date 02/15/21      PT LONG TERM GOAL #6   Title Pt will be able to perform aquatic program with appropriate assistance without increased pain for improved function, mobility, balance, strength, and activity tolerance.    Time 6    Status Not Met    Target Date 02/15/21      PT LONG TERM GOAL #7   Title Pt will report she is able to perform stairs at home safely without assistance to enter/exit home.    Time 6    Period Weeks    Status On-going    Target Date 02/15/21      PT LONG TERM GOAL #8   Title Pt will be able to perform at least 7 consecutive sit to stands with light UE assistance for improved functional LE strength and performance of daily transfers.    Time 6    Period Weeks    Status Not Met    Target Date 02/15/21                   Plan - 01/15/21 1613     Clinical Impression Statement Pt continues to be very limited with functional mobility and tolerance to activity.  She continues to have high levels of pain and requires assistance with ADLs and mobility including assistance to get OOB.  pt reports having significant back pain lately and had a MRI.  See MRI in epic under imaging or see subjective in note above for details.  PT used the chair lift for pt to enter/exit pool.  Pt demonstrated improved tolerance to aquatic exercises/activity today.  Pt able to stand for longer duration in the pool and able to ambulate  increased distance and duration with water walker today.  Pt stoops over with ambulation in the pool and required constant cuing to stand up straight.  Though pt did have improved tolerance to aquatic Rx today, she is still very limited with aquatic exercises.  Pt is very slow with movement on land including transfer to get in the aquatic chair lift and in the pool.  Pt was appreciative of PT and had no c/o's after Rx.    Comorbidities Fall Risk. Granuloma Annulare, major depressive disorder. Hx of legionnairre's disease and dengue fever, DM    PT Treatment/Interventions ADLs/Self Care Home Management;Aquatic Therapy;Cryotherapy;Electrical Stimulation;Ultrasound;Moist Heat;Gait training;Stair training;Functional mobility training;Therapeutic activities;Therapeutic exercise;Balance training;Neuromuscular re-education;Manual techniques;Patient/family education  PT Next Visit Plan Pt requires assistance with transfers, mobility, and ambulation/exercises in the pool.  Cont with aquatic therapy.  Cont using the water walker with ambulation in the pool.    PT Home Exercise Plan Pt has already received land based PT and did not improve.    Consulted and Agree with Plan of Care Patient             Patient will benefit from skilled therapeutic intervention in order to improve the following deficits and impairments:  Abnormal gait, Decreased activity tolerance, Decreased balance, Decreased mobility, Decreased endurance, Decreased range of motion, Decreased safety awareness, Decreased strength, Difficulty walking, Pain  Visit Diagnosis: Chronic low back pain, unspecified back pain laterality, unspecified whether sciatica present  Muscle weakness (generalized)  Difficulty in walking, not elsewhere classified  Chronic pain of both knees  Other lack of coordination  Repeated falls     Problem List Patient Active Problem List   Diagnosis Date Noted   Weakness 12/21/2020   Complaints of total  body pain 12/21/2020   Chronic migraine w/o aura w/o status migrainosus, not intractable 12/21/2020   Bilateral low back pain with bilateral sciatica 12/21/2020   Major depressive disorder, recurrent episode with anxious distress ([place]) 03/14/2020   Granuloma annulare 11/03/2019   History of dengue 11/03/2019   History of Legionnaire's disease 11/03/2019    Selinda Michaels III PT, DPT 01/15/21 11:06 PM   [facility] [address] [place], [place], [address] Phone: [phone]   Fax:  [phone]  Name: Karen Whitehead MRN: [id number] Date of Birth: October 15, 1965

## 2021-01-16 ENCOUNTER — Encounter: Payer: Self-pay | Admitting: Family Medicine

## 2021-01-16 ENCOUNTER — Other Ambulatory Visit: Payer: Self-pay | Admitting: Family Medicine

## 2021-01-16 ENCOUNTER — Other Ambulatory Visit: Payer: Self-pay

## 2021-01-16 DIAGNOSIS — G894 Chronic pain syndrome: Secondary | ICD-10-CM

## 2021-01-17 ENCOUNTER — Ambulatory Visit (HOSPITAL_BASED_OUTPATIENT_CLINIC_OR_DEPARTMENT_OTHER): Payer: No Typology Code available for payment source | Admitting: Physical Therapy

## 2021-01-17 ENCOUNTER — Encounter (HOSPITAL_BASED_OUTPATIENT_CLINIC_OR_DEPARTMENT_OTHER): Payer: Self-pay | Admitting: Physical Therapy

## 2021-01-17 ENCOUNTER — Other Ambulatory Visit: Payer: Self-pay

## 2021-01-17 DIAGNOSIS — M545 Low back pain, unspecified: Secondary | ICD-10-CM

## 2021-01-17 DIAGNOSIS — R262 Difficulty in walking, not elsewhere classified: Secondary | ICD-10-CM

## 2021-01-17 DIAGNOSIS — R296 Repeated falls: Secondary | ICD-10-CM

## 2021-01-17 DIAGNOSIS — M6281 Muscle weakness (generalized): Secondary | ICD-10-CM

## 2021-01-17 DIAGNOSIS — M25562 Pain in left knee: Secondary | ICD-10-CM

## 2021-01-17 DIAGNOSIS — G8929 Other chronic pain: Secondary | ICD-10-CM

## 2021-01-17 DIAGNOSIS — R278 Other lack of coordination: Secondary | ICD-10-CM

## 2021-01-17 NOTE — Therapy (Signed)
[place], [place], [address] Phone: [phone]   Fax:  [phone]  Physical Therapy Treatment  Patient Details  Name: Karen Whitehead MRN: [id number] Date of Birth: 04-25-1966 Referring Provider (PT): Dr. Charlott Rakes   Encounter Date: 01/17/2021   PT End of Session - 01/17/21 1205     Visit Number 8    Number of Visits 24    Date for PT Re-Evaluation 02/15/21    Progress Note Due on Visit --   02/15/21   PT Start Time 1102    PT Stop Time 1151    PT Time Calculation (min) 49 min    Equipment Utilized During Treatment Other (comment)   water walker, noodle   Activity Tolerance Patient tolerated treatment well    Behavior During Therapy [facility] for tasks assessed/performed             Past Medical History:  Diagnosis Date   Granuloma annulare     Past Surgical History:  Procedure Laterality Date   APPENDECTOMY      There were no vitals filed for this visit.   Subjective Assessment - 01/17/21 1153     Subjective Pt states her son has to help her get OOB everyday.  pt states she doesn't know what she would do without her son.  He provides assistance with ADLs/IADLs and mobility.  Pt states her back felt better immediately after prior Rx though was fatigued and felt more tension in legs when returning home.  Pt states her back, legs, and joints hurt and she is frustrated with the continued high levels of pain.  Pt reports she had to take 2 seated rest breaks but was able to ambulate in the facility to the pool today.  Pt is limited with functional mobility skills including ambulation, transfers, and standing activities.    Pertinent History Fall Risk.  Granuloma Annulare, major depressive disoder, legionnairre's disease, dengue fever, DM, memory loss, anxiety.    Diagnostic tests Lumbar MRI:  L1-L4 mild disc bulging, but no spinal stenosis or nn root compression.  L4-5 minimal disc bulging.    L5-S1: disc and interspace appear normal.  IMPRESSION:  mild multilevel degenerative changes that do not show spinal stenosis or nerve root compression at any of the levels.         Brain MRI:  IMPRESSION:  No acute or reversible finding. Generalized brain volume loss, more  than usually seen at this age  per MD note.  Pt's hip and knee x ray are negative.    Currently in Pain? Yes    Pain Score --   8-9/10   Pain Location --   LEs and back but doesn't give any specific areas               Pt seen for aquatic therapy today.  Treatment took place in water 3.5-4 ft in depth at the [facility]. Temp of water was 94.  Pt required assistance from PT to perform transfers.  Pt is slow with mobility and requires increased time to perform transfers.   Pt requires buoyancy for support and to offload joints with strengthening exercises. Viscosity of the water is needed for resistance of strengthening; water current perturbations provides challenge to standing balance unsupported, requiring increased core activation.   -Pt ambulated 3-4 laps and sidestepped 1 lap with water walker with SBA and occasional CGA/min assist.  PT provided cues to stand up tall and improve posture.   -  Pt performed:     -standing marching with UE's on pool wall x 10-15 reps.     -standing heel raises approx 7 reps and standing toe raises approx 12 reps with UE assist on pool wall.     -standing without UE assist x15 sec and x20 sec.  Pt required assistance on 2nd set due to LOB.     -Attempted UE noodle pulldowns with back to pool wall but pt unable to perform.  Pt did perform UE noodle      push/press with back to wall approx 10-12 reps.     -seated bicycles 2x10 reps     -seated LAQ approx 15-20 reps on R and approx 10-12 reps on L LE.  Pt had limited ROM on L             PT Education - 01/17/21 1204     Education Details Instructed pt in correct form with aquatic exercises. Instructed pt to stand up  tall with gait.    Person(s) Educated Patient    Methods Explanation;Demonstration;Verbal cues    Comprehension Verbalized understanding;Returned demonstration              PT Short Term Goals - 01/04/21 2055       PT SHORT TERM GOAL #1   Title Pt will be able to perform sit to stand transfers safely and independently.    Baseline requires assistance    Time 3    Period Weeks    Status Not Met    Target Date 12/10/20      PT SHORT TERM GOAL #2   Title Pt will report at least a 25% improvement overall in pain and mobility.    Baseline 10% improvement    Time 4    Period Weeks    Status On-going    Target Date 12/17/20      PT SHORT TERM GOAL #3   Title Pt will ambulate at least 100 ft with improved gait speed and correct usage of walker.    Baseline slow gait speed    Time 4    Period Weeks    Status On-going    Target Date 12/17/20      PT SHORT TERM GOAL #4   Title Pt will  improve TUG time to no > 58mn and 20 seconds with walker.    Baseline 55 seconds    Time 5    Period Weeks    Status Achieved      PT SHORT TERM GOAL #5   Title Pt will demo improved LE strength to 4- to 4/5 MMT in hip flexion, knee flex and extension, and DF bilat for improved mobility, ambulation, and tolerance to activity.    Baseline see objective findings in IE    Time 6    Period Weeks    Status Not Met    Target Date 12/31/20      PT SHORT TERM GOAL #6   Title Pt will report improved standing duration at home to improve performance of self care activities and light IADLs.    Baseline reports no improvement    Time 5    Period Weeks    Status Not Met    Target Date 12/24/20               PT Long Term Goals - 01/04/21 2057       PT LONG TERM GOAL #1   Title Pt will ambulate at least 200 ft  with walker with no > than SBA/CGA without having to sit down    Time 6    Status On-going    Target Date 02/15/21      PT LONG TERM GOAL #2   Title Pt will tolerate aquatic  therapy without significant increased pain in order to establish an exercise routine to improve functional mobility and tolerance to activity.    Time 6    Period Weeks    Status Not Met    Target Date 02/15/21      PT LONG TERM GOAL #3   Title Pt will be able to perform TUG in no > than 20 sec for improved mobility and gait speed.    Time 6    Period Weeks    Status On-going    Target Date 02/15/21      PT LONG TERM GOAL #4   Title Pt will be able to ambulate community distance with walker without significant pain or difficulty.    Time 6    Period Weeks    Status Not Met    Target Date 02/15/21      PT LONG TERM GOAL #5   Title Pt will report she is able to perform bed mobility, self care activities, and some light IADLs in her home without signifiant back and LE pain.    Time 6    Period Weeks    Status Not Met    Target Date 02/15/21      PT LONG TERM GOAL #6   Title Pt will be able to perform aquatic program with appropriate assistance without increased pain for improved function, mobility, balance, strength, and activity tolerance.    Time 6    Status Not Met    Target Date 02/15/21      PT LONG TERM GOAL #7   Title Pt will report she is able to perform stairs at home safely without assistance to enter/exit home.    Time 6    Period Weeks    Status On-going    Target Date 02/15/21      PT LONG TERM GOAL #8   Title Pt will be able to perform at least 7 consecutive sit to stands with light UE assistance for improved functional LE strength and performance of daily transfers.    Time 6    Period Weeks    Status Not Met    Target Date 02/15/21                   Plan - 01/17/21 1212     Clinical Impression Statement Pt continues to be very limited with functional mobility and tolerance to activity. She continues to have high levels of pain and requires assistance with ADLs and mobility including assistance to get OOB.  Pt is frustated with her pain.  Pt  ambulated in the facility all the way to the pool today instead of using the W/C. She took 2 seated breaks though ambulated the entire way before her Rx.  She has not done that since very early in her PT.  Pt also descended the stairs using bilat rails to enter pool.  Pt had 1 LOB in the pool whe descending stairs requiring max assistance from PT to correct LOB  and PT had pt sit on the step in the pool.  Pt is improving with tolerance to pool exercises as evidenced by performance of gait and standing activities.  She is still very limited with tolerance  to pool exercises though is making progress.  Pt required decreased assistance with ambulation in the pool with water walker and was able to stand taller having better posture with cuing.  Pt ambulated with improved speed in the pool.  Pt had 1 LOB while performing standing without UE assist in the pool requiring PT assistance.  PT used the chair lift for pt to exit the pool.  Pt is very slow with movement on land including transfer to get in the aquatic chair lift and in the pool and sit to stand transfers in general.  Pt requires assistance to perform sit to stand transfers.  Pt was appreciative of PT.  She may benefit from cont aquatic PT to improve strength, tolerance to activity, function, and mobility.    PT Treatment/Interventions ADLs/Self Care Home Management;Aquatic Therapy;Cryotherapy;Electrical Stimulation;Ultrasound;Moist Heat;Gait training;Stair training;Functional mobility training;Therapeutic activities;Therapeutic exercise;Balance training;Neuromuscular re-education;Manual techniques;Patient/family education    PT Next Visit Plan Pt requires assistance with transfers, mobility, and ambulation/exercises in the pool.  Cont with aquatic therapy.  Cont using the water walker with ambulation in the pool.  increase standing duration    PT Home Exercise Plan Pt has already received land based PT and did not improve.    Consulted and Agree with Plan of  Care Patient             Patient will benefit from skilled therapeutic intervention in order to improve the following deficits and impairments:  Abnormal gait, Decreased activity tolerance, Decreased balance, Decreased mobility, Decreased endurance, Decreased range of motion, Decreased safety awareness, Decreased strength, Difficulty walking, Pain  Visit Diagnosis: Chronic low back pain, unspecified back pain laterality, unspecified whether sciatica present  Muscle weakness (generalized)  Difficulty in walking, not elsewhere classified  Chronic pain of both knees  Other lack of coordination  Repeated falls     Problem List Patient Active Problem List   Diagnosis Date Noted   Weakness 12/21/2020   Complaints of total body pain 12/21/2020   Chronic migraine w/o aura w/o status migrainosus, not intractable 12/21/2020   Bilateral low back pain with bilateral sciatica 12/21/2020   Major depressive disorder, recurrent episode with anxious distress ([place]) 03/14/2020   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

## 2021-01-22 ENCOUNTER — Telehealth: Payer: Self-pay | Admitting: Physical Medicine and Rehabilitation

## 2021-01-22 ENCOUNTER — Ambulatory Visit (HOSPITAL_BASED_OUTPATIENT_CLINIC_OR_DEPARTMENT_OTHER): Payer: No Typology Code available for payment source | Admitting: Physical Therapy

## 2021-01-22 NOTE — Telephone Encounter (Signed)
Patient is in pain and would like to know if Dr. Ranell Patrick can do anything for her.  Please call her at [phone].

## 2021-01-23 ENCOUNTER — Other Ambulatory Visit: Payer: Self-pay

## 2021-01-23 ENCOUNTER — Ambulatory Visit (INDEPENDENT_AMBULATORY_CARE_PROVIDER_SITE_OTHER): Payer: No Payment, Other | Admitting: Licensed Clinical Social Worker

## 2021-01-23 ENCOUNTER — Other Ambulatory Visit: Payer: Self-pay | Admitting: Obstetrics and Gynecology

## 2021-01-23 DIAGNOSIS — Z1231 Encounter for screening mammogram for malignant neoplasm of breast: Secondary | ICD-10-CM

## 2021-01-23 DIAGNOSIS — F332 Major depressive disorder, recurrent severe without psychotic features: Secondary | ICD-10-CM | POA: Diagnosis not present

## 2021-01-23 DIAGNOSIS — F331 Major depressive disorder, recurrent, moderate: Secondary | ICD-10-CM

## 2021-01-23 NOTE — Progress Notes (Signed)
THERAPIST PROGRESS NOTE   Virtual Visit via Video Note  I connected with [name] on 01/23/21 at  3:00 PM EDT by a video enabled telemedicine application and verified that I am speaking with the correct person using two identifiers.  Location: Patient: Home Provider: [facility]   I discussed the limitations of evaluation and management by telemedicine and the availability of in person appointments. The patient expressed understanding and agreed to proceed. I discussed the assessment and treatment plan with the patient. The patient was provided an opportunity to ask questions and all were answered. The patient agreed with the plan and demonstrated an understanding of the instructions.   The patient was advised to call back or seek an in-person evaluation if the symptoms worsen or if the condition fails to improve as anticipated.  I provided 45 minutes of non-face-to-face time during this encounter.  Participation Level: Active  Behavioral Response: CasualAlertDepressed and Dysphoric  Type of Therapy: Individual Therapy  Treatment Goals addressed: Communication: dep/anx/coping  Interventions: CBT, Solution Focused, and Supportive  Summary: Karen Whitehead is a 55 y.o. female who presents with hx of dep/anx. Today pt logs on for video session. She can see and hear clinician yet LCSW has no audio for her only video. Pt agrees to have phone appt. Pt is very low in mood this date. Pt reports she has results of MRI of head, showing "multiple trauma dots" and "lots of deterioration". She states she also has results of MRI of back showing bulging disc and degenerative spine. Pt reports she is in much pain with a pain management appt tomorrow. In addition pt advises she is being eval for CA after an abnormal pap smear and bloodwork. Pt reports "lumps in breasts" with mammogram to done Oct 13. Assessment of pt's last mammogram reveals "over 10 yrs ago". Pt reports her mother died of stomach CA.  Pt is intermittently tearful and states "It's just one thing after another". Pt advises she is having much difficulty with ADL's. She states she has been getting out for her aquatic therapy and states how helpful this in and "I enjoy it so much". LCSW provided active listening and assisted pt to process feelings r/t all she is coping with. Faith remains her primary coping tool. She states her sister is back in town and they are able to talk daily but sister had foot surgery and is homebound at present. Pt taking MH meds as prescribed. Pt encouraged to continue to meditate, deep breathe, open blinds for natural light, stay mindful of self talk. Pt reports tomorrow is her son's birthday and she has nothing for him, which is very distressing to her. LCSW assisted pt to problem solve. Pt will write a special note to her son to have ready before he gets home from work. Pt states she has one piece of good news to share. She states she has reunited with her best friend from childhood, Janett Billow, who lives in [place]. They had lost touch but she heard from [place] out of the blue. She states she told Janett Billow what was happening in her life. She reports Janett Billow and her dtr, who pt also close to, flew out to see her within a wk and stayed 4.5 days. She states they helped her with numerous things and it was one of the most uplifting experiences she has had in such a long time. She provides details of the visit and her tone of voice, mood, reflects joy as she reminisces. LCSW  shared in pt's joy and the benefits of a support system. LCSW reviewed poc including scheduling prior to close of session. Pt states appreciation for care.    Suicidal/Homicidal: Nowithout intent/plan  Therapist Response: Pt remains receptive to care. Plan: Return again for next avail appt.  Diagnosis: Axis I: Major Depression, Recurrent severe   Hermine Messick, LCSW 01/23/2021

## 2021-01-24 ENCOUNTER — Other Ambulatory Visit: Payer: Self-pay | Admitting: Family Medicine

## 2021-01-24 ENCOUNTER — Encounter: Payer: Self-pay | Admitting: Physical Medicine and Rehabilitation

## 2021-01-24 ENCOUNTER — Other Ambulatory Visit: Payer: Self-pay

## 2021-01-24 ENCOUNTER — Encounter
Payer: No Typology Code available for payment source | Attending: Physical Medicine and Rehabilitation | Admitting: Physical Medicine and Rehabilitation

## 2021-01-24 VITALS — BP 114/80 | HR 88 | Temp 98.4°F | Ht 64.0 in | Wt 229.4 lb

## 2021-01-24 DIAGNOSIS — M5137 Other intervertebral disc degeneration, lumbosacral region: Secondary | ICD-10-CM | POA: Insufficient documentation

## 2021-01-24 DIAGNOSIS — Z1231 Encounter for screening mammogram for malignant neoplasm of breast: Secondary | ICD-10-CM

## 2021-01-24 DIAGNOSIS — H543 Unqualified visual loss, both eyes: Secondary | ICD-10-CM | POA: Insufficient documentation

## 2021-01-24 DIAGNOSIS — M7918 Myalgia, other site: Secondary | ICD-10-CM | POA: Insufficient documentation

## 2021-01-24 DIAGNOSIS — M47819 Spondylosis without myelopathy or radiculopathy, site unspecified: Secondary | ICD-10-CM | POA: Insufficient documentation

## 2021-01-24 MED ORDER — TOPIRAMATE 25 MG PO TABS
25.0000 mg | ORAL_TABLET | Freq: Two times a day (BID) | ORAL | 3 refills | Status: DC
  Filled 2021-01-24 – 2021-01-31 (×2): qty 60, 30d supply, fill #0
  Filled 2021-03-06: qty 60, 30d supply, fill #1

## 2021-01-24 NOTE — Progress Notes (Signed)
Subjective:    Patient ID: Karen Whitehead, female    DOB: 14-Aug-1965, 55 y.o.   MRN: [id number]  HPI   Karen Whitehead is a 55 year old woman who presents for f/u of myalgias post-dengue fever.  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  -she gets lots of headaches -she has never tried topamax and amitriptyline  -reviewed her hip and knee XRs with her: negative -she would like to get XRs of her spine as well.  -her hands and muscles throb so baldy that she cannot fall asleep at night -she takes one muscle relaxer in the morning -she used to be very active and takes girls trips to the beach.    2) Insomnia: -she currently takes trazodone and sertraline.  -she has not found relief with the amitriptyline 10mg .  -her pain keeps her awake  3) memory loss -has not worked with SLP before -does not follow with neurology -discussed results of her MRI brain with her- shows degeneration atypical for her age but no acute processes.   4) Rash- has to wear everything shortsleeve  5) Lumbar DDD: -she cannot handle hte pain -muscle relaxers don't help -she loves aquatic therapy- the take their time and are trying to build her muscles -she has an EMG/NCS scheduled -she wants a medicine to help her get through the pain today.  -she has tried ibuprofen and was was told not to because of her kidneys  6) Fall: -she fell sideways on her elbow when she could not judge the distance appropriately.  7) Vision goes in and out -sometimes she can see the TV and sometimes she can't   Diet: her son usually cooks her meals for her.    Pain Inventory Average Pain 9 Pain Right Now 8 My pain is constant, sharp, stabbing, tingling and aching  In the last 24 hours,  has pain interfered with the following? General activity 1 Relation with others 1 Enjoyment of life 0 What TIME of day is your pain at its worst? morning , daytime, evening and night Sleep (in general) Poor  Pain is worse with: walking, bending, inactivity and standing Pain improves with: n/a Relief from Meds: 0  use a walker how many minutes can you walk? 5 ability to climb steps?  yes do you drive?  no  not employed: date last employed . I need assistance with the following:  dressing, bathing, household duties and shopping  bladder control problems weakness numbness tingling trouble walking spasms dizziness confusion depression anxiety suicidal thoughts        No family history on file. Social History   Socioeconomic History   Marital status: Single    Spouse name: Not on file   Number of children: Not on file   Years of education: Not on file   Highest education level: Not on file  Occupational History   Not on file  Tobacco Use   Smoking status: Every Day    Packs/day: 0.50    Types: Cigarettes   Smokeless tobacco: Never  Substance and Sexual Activity   Alcohol use: No   Drug use: No   Sexual activity: Not on file  Other Topics Concern   Not on file  Social History  Narrative   Not on file   Social Determinants of Health   Financial Resource Strain: Not on file  Food Insecurity: Not on file  Transportation Needs: Not on file  Physical Activity: Not on file  Stress: Not on file  Social Connections: Not on file   Past Surgical History:  Procedure Laterality Date   APPENDECTOMY     Past Medical History:  Diagnosis Date   Granuloma annulare    BP 114/80   Pulse 88   Temp 98.4 F (36.9 C) (Oral)   Ht 5\' 4"  (1.626 m)   Wt 229 lb 6.4 oz (104.1 kg)   SpO2 95%   BMI 39.38 kg/m   Opioid Risk Score:   Fall Risk Score:  `1  Depression screen PHQ 2/9  Depression screen [facility] 2/9 12/17/2020 10/31/2020 09/20/2020 02/06/2020 11/16/2019  11/03/2019  Decreased Interest 3 3 3 3 3 3   Down, Depressed, Hopeless 3 3 3 3 3 3   PHQ - 2 Score 6 6 6 6 6 6   Altered sleeping 3 3 3 3 3 3   Tired, decreased energy 3 3 3 3 3 3   Change in appetite 3 3 3 3 3 3   Feeling bad or failure about yourself  3 3 3 3 3 3   Trouble concentrating 3 3 - 3 3 2   Moving slowly or fidgety/restless 3 3 2 1  0 -  Suicidal thoughts 3 2 1 2 1 1   PHQ-9 Score 27 26 21 24 22 21   Difficult doing work/chores - - Extremely dIfficult - - -  Some encounter information is confidential and restricted. Go to Review Flowsheets activity to see all data.    Review of Systems  Constitutional:  Positive for appetite change.  Respiratory:  Positive for cough and shortness of breath.   Gastrointestinal:  Positive for constipation.  Musculoskeletal:  Positive for back pain, gait problem and neck pain.       Left hip and knee pain Pain in back of legs  Neurological:  Positive for dizziness, weakness and numbness.       Tongling  Psychiatric/Behavioral:  Positive for confusion, dysphoric mood and suicidal ideas. The patient is nervous/anxious.   All other systems reviewed and are negative.     Objective:   Physical Exam Gen: no distress, normal appearing HEENT: oral mucosa pink and moist, NCAT Cardio: Reg rate Chest: normal effort, normal rate of breathing Abd: soft, non-distended Ext: no edema Psych: pleasant, normal affect Skin: intact Neuro: slow speech, slow processing Musculoskeletal: extremely decreased strength 3/5 throughout. Tenderness to palpation over right paraspinals. Antalgic gait with RW. Unable to bend due to weakness.     Assessment & Plan:  1)Chronic pain syndrome and fatigue secondary to myalgias and arthralgias following Dengue fever -Discussed current symptoms of pain and history of pain.  -Discussed benefits of exercise in reducing pain. -discussed that XRs of hip and knees are normal -ordered XRs of spine as well.  -continue water  aerobics -cannot increase meloxicam further given risk of GI bleeding.  -cannot cymbalta due to risk of bleed.  -tylenol does not help -lidocaine patches do help -Discussed current symptoms of pain and history of pain.  -Discussed benefits of exercise in reducing pain. -Discussed following foods that may reduce pain: 1) Ginger (especially studied for arthritis)- reduce leukotriene production to decrease inflammation 2) Blueberries- high in phytonutrients that decrease inflammation 3) Salmon- marine omega-3s reduce joint swelling and pain 4) Pumpkin seeds- reduce inflammation 5) dark chocolate- reduces inflammation  6) turmeric- reduces inflammation 7) tart cherries - reduce pain and stiffness 8) extra virgin olive oil - its compound olecanthal helps to block prostaglandins  9) chili peppers- can be eaten or applied topically via capsaicin 10) mint- helpful for headache, muscle aches, joint pain, and itching 11) garlic- reduces inflammation  Link to further information on diet for chronic pain: http://[email or web address].[email or web address]/   2) Insomnia:  -continue amitriptyline to 25mg .  -seroquel produced hallucinations.   3) Left sided weakness/memory loss -MRI brain ordered to assess for possible contributory neurological process: reviewed results with her: shows degeneration atypical for her age without acute process -referred to neurology -restart 1TB coconut oil daily, discussed may feel nausea at first, goal to increase to 4 TB over time as tolerated  4) Anxiety: discussed that her clonopin has to be filled by her psychiatry or internal medicine.   5) Myofascial pain: will schedule for trigger point injections.  -continue muscle relaxers.   6) Lumbar disc degeneration with facet hypertrophy -prescribed topamax 25mg  BID -lidocaine patches seem to help -continue Aqua Therapy -Reviewed MRI of lumbar spine with her which does  mild facet hypertrophy at multiple levels -Medial branch block indicated for right sided pain at L3-L4 and L4-L5 levels. Patient has never medial branch blocks before.   7) Visual deficits:  -encouraged follow-up with optometrist.  -advised that this could contribute to headaches.

## 2021-01-24 NOTE — Patient Instructions (Signed)

## 2021-01-29 ENCOUNTER — Telehealth: Payer: No Typology Code available for payment source | Admitting: Physical Medicine and Rehabilitation

## 2021-01-29 ENCOUNTER — Ambulatory Visit (HOSPITAL_BASED_OUTPATIENT_CLINIC_OR_DEPARTMENT_OTHER): Payer: No Typology Code available for payment source | Admitting: Physical Therapy

## 2021-01-29 ENCOUNTER — Other Ambulatory Visit: Payer: Self-pay

## 2021-01-29 DIAGNOSIS — M25561 Pain in right knee: Secondary | ICD-10-CM

## 2021-01-29 DIAGNOSIS — M6281 Muscle weakness (generalized): Secondary | ICD-10-CM

## 2021-01-29 DIAGNOSIS — R296 Repeated falls: Secondary | ICD-10-CM

## 2021-01-29 DIAGNOSIS — G8929 Other chronic pain: Secondary | ICD-10-CM

## 2021-01-29 DIAGNOSIS — R262 Difficulty in walking, not elsewhere classified: Secondary | ICD-10-CM

## 2021-01-29 DIAGNOSIS — R278 Other lack of coordination: Secondary | ICD-10-CM

## 2021-01-30 ENCOUNTER — Encounter (HOSPITAL_BASED_OUTPATIENT_CLINIC_OR_DEPARTMENT_OTHER): Payer: Self-pay | Admitting: Physical Therapy

## 2021-01-30 NOTE — Therapy (Signed)
[name], [place], [address] Phone: [phone]   Fax:  [phone]  Physical Therapy Treatment  Patient Details  Name: Karen Whitehead MRN: [id number] Date of Birth: 1966-02-17 Referring Provider (PT): Dr. Charlott Rakes   Encounter Date: 01/29/2021   PT End of Session - 01/29/21 1531     Visit Number 9    Number of Visits 24    Date for PT Re-Evaluation 02/15/21    PT Start Time 5456    PT Stop Time 1520    PT Time Calculation (min) 35 min    Equipment Utilized During Treatment Other (comment)   water walker   Activity Tolerance Other (comment)   poor tolerance for exercises and treatment   Behavior During Therapy [facility] for tasks assessed/performed             Past Medical History:  Diagnosis Date   Granuloma annulare     Past Surgical History:  Procedure Laterality Date   APPENDECTOMY      There were no vitals filed for this visit.   Subjective Assessment - 01/29/21 1130     Subjective Pt states she is having a bad day.  Pt states her son has to help her get OOB everyday.  He provides assistance with ADLs/IADLs and mobility.  Pt states she had pain after prior Rx. Pt is frustrated with the continued high levels of pain and and states she doesn't think her doctors are listening to her about her pain.  Pt is limited with functional mobility skills including ambulation, transfers, and standing activities.    Pertinent History Fall Risk.  Granuloma Annulare, major depressive disoder, legionnairre's disease, dengue fever, DM, memory loss, anxiety.    Diagnostic tests Lumbar MRI:  L1-L4 mild disc bulging, but no spinal stenosis or nn root compression.  L4-5 minimal disc bulging.   L5-S1: disc and interspace appear normal.  IMPRESSION:  mild multilevel degenerative changes that do not show spinal stenosis or nerve root compression at any of the levels.         Brain MRI:  IMPRESSION:  No acute or  reversible finding. Generalized brain volume loss, more  than usually seen at this age  per MD note.  Pt's hip and knee x ray are negative.    Currently in Pain? Yes    Pain Score --   25/10   Pain Location --   back                 Pt seen for aquatic therapy today.  Treatment took place in water 3.5-4 ft in depth at the [facility]. Temp of water was 94.  Pt required assistance from PT to perform transfers.  Used aquatic chair lift for pt to enter and exit pool.  Pt is slow with mobility and requires increased time to perform transfers.   Pt requires buoyancy for support and to offload joints with strengthening exercises. Viscosity of the water is needed for resistance of strengthening; water current perturbations provides challenge to standing balance unsupported, requiring increased core activation.   -Pt ambulated approx 1 lap with water walker with min assist and mod assist for LOB.  PT provided cues to stand up tall and improve posture.  Pt had much difficulty with stepping and advancing LEs.  Pt unable to advance LE well.  -Pt performed:     -floating in the water walker to unload joints and improve pain.     -  Marching and heel raises with UE assist on pool wall approx 10 reps each though pt had decreased ROM     -seated marching approx 10 reps though pt had decreased ROM     -attempted seated LAQ though pt had very minimal movement             PT Short Term Goals - 01/04/21 2055       PT SHORT TERM GOAL #1   Title Pt will be able to perform sit to stand transfers safely and independently.    Baseline requires assistance    Time 3    Period Weeks    Status Not Met    Target Date 12/10/20      PT SHORT TERM GOAL #2   Title Pt will report at least a 25% improvement overall in pain and mobility.    Baseline 10% improvement    Time 4    Period Weeks    Status On-going    Target Date 12/17/20      PT SHORT TERM GOAL #3   Title Pt will ambulate at  least 100 ft with improved gait speed and correct usage of walker.    Baseline slow gait speed    Time 4    Period Weeks    Status On-going    Target Date 12/17/20      PT SHORT TERM GOAL #4   Title Pt will  improve TUG time to no > 4min and 20 seconds with walker.    Baseline 55 seconds    Time 5    Period Weeks    Status Achieved      PT SHORT TERM GOAL #5   Title Pt will demo improved LE strength to 4- to 4/5 MMT in hip flexion, knee flex and extension, and DF bilat for improved mobility, ambulation, and tolerance to activity.    Baseline see objective findings in IE    Time 6    Period Weeks    Status Not Met    Target Date 12/31/20      PT SHORT TERM GOAL #6   Title Pt will report improved standing duration at home to improve performance of self care activities and light IADLs.    Baseline reports no improvement    Time 5    Period Weeks    Status Not Met    Target Date 12/24/20               PT Long Term Goals - 01/04/21 2057       PT LONG TERM GOAL #1   Title Pt will ambulate at least 200 ft with walker with no > than SBA/CGA without having to sit down    Time 6    Status On-going    Target Date 02/15/21      PT LONG TERM GOAL #2   Title Pt will tolerate aquatic therapy without significant increased pain in order to establish an exercise routine to improve functional mobility and tolerance to activity.    Time 6    Period Weeks    Status Not Met    Target Date 02/15/21      PT LONG TERM GOAL #3   Title Pt will be able to perform TUG in no > than 20 sec for improved mobility and gait speed.    Time 6    Period Weeks    Status On-going    Target Date 02/15/21  PT LONG TERM GOAL #4   Title Pt will be able to ambulate community distance with walker without significant pain or difficulty.    Time 6    Period Weeks    Status Not Met    Target Date 02/15/21      PT LONG TERM GOAL #5   Title Pt will report she is able to perform bed mobility,  self care activities, and some light IADLs in her home without signifiant back and LE pain.    Time 6    Period Weeks    Status Not Met    Target Date 02/15/21      PT LONG TERM GOAL #6   Title Pt will be able to perform aquatic program with appropriate assistance without increased pain for improved function, mobility, balance, strength, and activity tolerance.    Time 6    Status Not Met    Target Date 02/15/21      PT LONG TERM GOAL #7   Title Pt will report she is able to perform stairs at home safely without assistance to enter/exit home.    Time 6    Period Weeks    Status On-going    Target Date 02/15/21      PT LONG TERM GOAL #8   Title Pt will be able to perform at least 7 consecutive sit to stands with light UE assistance for improved functional LE strength and performance of daily transfers.    Time 6    Period Weeks    Status Not Met    Target Date 02/15/21                   Plan - 01/30/21 1322     Clinical Impression Statement Pt used W/C in facility to get to the pool but was able to ambulate to the pool last Rx.  Pt states she is having a bad day and is frustrated with her continued high levels of pain.  Pt required increased assistance to perform transfers from the W/C to the aquatic chair lift.  Pt did not tolerate aquatic Rx well today.  She had increased difficulty lifting LE's with exercises and with stepping in the water.  Pt had significant difficulty with walking in the water and was unable to walk much in the water.  She primarily floated with aquatic walker.  Pt had multiple LOB's which required PT assistance to correct LOB.  Exercises were very limited due to pt's pain and difficulty with movement.    Comorbidities Fall Risk. Granuloma Annulare, major depressive disorder. Hx of legionnairre's disease and dengue fever, DM    PT Treatment/Interventions ADLs/Self Care Home Management;Aquatic Therapy;Cryotherapy;Electrical Stimulation;Ultrasound;Moist  Heat;Gait training;Stair training;Functional mobility training;Therapeutic activities;Therapeutic exercise;Balance training;Neuromuscular re-education;Manual techniques;Patient/family education    PT Next Visit Plan Pt requires assistance with transfers, mobility, and ambulation/exercises in the pool.  Cont with aquatic therapy.  Cont using the water walker with ambulation in the pool.  increase standing duration    PT Home Exercise Plan Pt has already received land based PT and did not improve.    Consulted and Agree with Plan of Care Patient             Patient will benefit from skilled therapeutic intervention in order to improve the following deficits and impairments:  Abnormal gait, Decreased activity tolerance, Decreased balance, Decreased mobility, Decreased endurance, Decreased range of motion, Decreased safety awareness, Decreased strength, Difficulty walking, Pain  Visit Diagnosis: Chronic low back pain,  unspecified back pain laterality, unspecified whether sciatica present  Muscle weakness (generalized)  Difficulty in walking, not elsewhere classified  Chronic pain of both knees  Other lack of coordination  Repeated falls     Problem List Patient Active Problem List   Diagnosis Date Noted   Weakness 12/21/2020   Complaints of total body pain 12/21/2020   Chronic migraine w/o aura w/o status migrainosus, not intractable 12/21/2020   Bilateral low back pain with bilateral sciatica 12/21/2020   Major depressive disorder, recurrent episode with anxious distress ([place]) 03/14/2020   Granuloma annulare 11/03/2019   History of dengue 11/03/2019   History of Legionnaire's disease 11/03/2019    Selinda Michaels III PT, DPT 01/30/21 1:31 PM   [facility] [address] [place], [place], [address] Phone: [phone]   Fax:  [phone]  Name: Karen Whitehead MRN: [id number] Date of Birth: 1965/05/25

## 2021-01-31 ENCOUNTER — Telehealth (HOSPITAL_COMMUNITY): Payer: Self-pay | Admitting: *Deleted

## 2021-01-31 ENCOUNTER — Other Ambulatory Visit: Payer: Self-pay | Admitting: Family Medicine

## 2021-01-31 ENCOUNTER — Other Ambulatory Visit (HOSPITAL_COMMUNITY): Payer: Self-pay | Admitting: Psychiatry

## 2021-01-31 ENCOUNTER — Other Ambulatory Visit: Payer: Self-pay | Admitting: Obstetrics and Gynecology

## 2021-01-31 ENCOUNTER — Other Ambulatory Visit: Payer: Self-pay

## 2021-01-31 DIAGNOSIS — F339 Major depressive disorder, recurrent, unspecified: Secondary | ICD-10-CM

## 2021-01-31 DIAGNOSIS — Z1231 Encounter for screening mammogram for malignant neoplasm of breast: Secondary | ICD-10-CM

## 2021-01-31 MED ORDER — ALBUTEROL SULFATE HFA 108 (90 BASE) MCG/ACT IN AERS
1.0000 | INHALATION_SPRAY | Freq: Four times a day (QID) | RESPIRATORY_TRACT | 1 refills | Status: DC | PRN
  Filled 2021-01-31: qty 18, 25d supply, fill #0
  Filled 2021-03-14: qty 18, 25d supply, fill #1

## 2021-01-31 MED FILL — Glipizide Tab 5 MG: ORAL | 30 days supply | Qty: 30 | Fill #0 | Status: AC

## 2021-01-31 NOTE — Telephone Encounter (Signed)
VM from patient calling with a refill request for her Klonopin. She should be out, per last note she had been over taking it.She has a next appt with her provider on 12/13. Will ask Dr Ronne Binning to refill it for her if she sees it as appropriate.

## 2021-01-31 NOTE — Telephone Encounter (Signed)
Medication refilled and sent to preferred pharmacy

## 2021-02-01 ENCOUNTER — Other Ambulatory Visit: Payer: Self-pay

## 2021-02-04 ENCOUNTER — Other Ambulatory Visit: Payer: Self-pay

## 2021-02-06 ENCOUNTER — Ambulatory Visit (HOSPITAL_COMMUNITY): Payer: No Payment, Other | Admitting: Licensed Clinical Social Worker

## 2021-02-14 ENCOUNTER — Other Ambulatory Visit: Payer: Self-pay

## 2021-02-14 ENCOUNTER — Ambulatory Visit: Payer: Self-pay | Admitting: *Deleted

## 2021-02-14 ENCOUNTER — Ambulatory Visit
Admission: RE | Admit: 2021-02-14 | Discharge: 2021-02-14 | Disposition: A | Discharge status: Home / Self Care | Payer: No Typology Code available for payment source | Source: Ambulatory Visit | Attending: Obstetrics and Gynecology | Admitting: Obstetrics and Gynecology

## 2021-02-14 VITALS — BP 128/82 | Wt 225.0 lb

## 2021-02-14 DIAGNOSIS — R87612 Low grade squamous intraepithelial lesion on cytologic smear of cervix (LGSIL): Secondary | ICD-10-CM

## 2021-02-14 DIAGNOSIS — Z1211 Encounter for screening for malignant neoplasm of colon: Secondary | ICD-10-CM

## 2021-02-14 DIAGNOSIS — Z1231 Encounter for screening mammogram for malignant neoplasm of breast: Secondary | ICD-10-CM

## 2021-02-14 DIAGNOSIS — Z1239 Encounter for other screening for malignant neoplasm of breast: Secondary | ICD-10-CM

## 2021-02-14 NOTE — Patient Instructions (Signed)
Explained breast self awareness with [place]. Patient did not need a Pap smear today due to last Pap smear was 12/17/2020. Explained the colposcopy the recommended follow-up for her abnormal Pap smear. Referred patient to the [facility] for [place] for a colposcopy to follow up for her abnormal Pap smear. Appointment scheduled Thursday, March 14, 2021 at 1355. Patient aware of appointment and will be there. Referred patient to the [facility] for a screening mammogram on mobile unit. Appointment scheduled Thursday, February 14, 2021 at 1500. Patient escorted to the mobile unit following [facility] appointment for her screening mammogram. Let patient know the [facility] will follow up with her within the next couple weeks with results of her mammogram by letter or phone. Discussed smoking cessation with patient. Referred to the [facility] Quitline and gave resources to the free smoking cessation classes at [facility]. [place] verbalized understanding.  Roque Schill, Arvil [place], RN 2:47 PM

## 2021-02-14 NOTE — Progress Notes (Signed)
Karen Whitehead is a 55 y.o. female who presents to [facility] clinic today with no complaints. Patient referred to [facility]CP by [facility] due to having an abnormal Pap smear 12/17/2020 that a colposcopy is recommended for follow-up.   Pap Smear: Pap smear not completed today. Last Pap smear was 12/17/2020 at [facility] and was abnormal - LGSIL with positive HPV . Per patient has no history of an abnormal Pap smear. Last Pap smear result is available in Epic.   Physical exam: Breasts Breasts symmetrical. No skin abnormalities bilateral breasts. No nipple retraction bilateral breasts. No nipple discharge bilateral breasts. No lymphadenopathy. No lumps palpated bilateral breasts. Complaints of bilateral nipple area tenderness on exam.   Pelvic/Bimanual Pap is not indicated today per BCCCP guidelines.   Smoking History: Patient is a current smoker. Discussed smoking cessation with patient. Referred to the [facility] Quitline and gave resources to the free smoking cessation classes at [facility].   Patient Navigation: Patient education provided. Access to services provided for patient through [facility] program.   Colorectal Cancer Screening: Per patient has never had colonoscopy completed. FIT Test given to patient to complete. No complaints today.    Breast and Cervical Cancer Risk Assessment: Patient does not have family history of breast cancer, known genetic mutations, or radiation treatment to the chest before age 19. Patient does not have history of cervical dysplasia, immunocompromised, or DES exposure in-utero.  Risk Assessment     Risk Scores       02/14/2021   Last edited by: Demetrius Revel, LPN   5-year risk: 0.9 %   Lifetime risk: 6.9 %            A: BCCCP exam without pap smear No complaints.  P: Referred patient to the [facility] for a screening mammogram on mobile unit. Appointment scheduled  Thursday, February 14, 2021 at 1500.  Referred patient to the [facility] for a colposcopy to follow up for her abnormal Pap smear. Appointment scheduled Thursday, March 14, 2021 at 1355.  Loletta Parish, RN 02/14/2021 2:47 PM

## 2021-02-14 NOTE — Addendum Note (Signed)
Addended by: Demetrius Revel on: 02/14/2021 04:36 PM   Modules accepted: Orders

## 2021-02-14 NOTE — Progress Notes (Signed)
Upon completion of mammogram, Patient was asked did she still need to go to [facility] (across the street). Patient replied no, she will discuss her feelings with her son (whom she lives with), and if needed, will have her son transport her to [facility]. Patient left with [place] ([facility]-provided with wheelchair assist),and was in no distress, smiled and waved as she left.

## 2021-02-20 ENCOUNTER — Ambulatory Visit (INDEPENDENT_AMBULATORY_CARE_PROVIDER_SITE_OTHER): Payer: Self-pay | Admitting: Neurology

## 2021-02-20 DIAGNOSIS — R52 Pain, unspecified: Secondary | ICD-10-CM

## 2021-02-20 DIAGNOSIS — R531 Weakness: Secondary | ICD-10-CM

## 2021-02-20 DIAGNOSIS — E559 Vitamin D deficiency, unspecified: Secondary | ICD-10-CM | POA: Insufficient documentation

## 2021-02-20 DIAGNOSIS — G43709 Chronic migraine without aura, not intractable, without status migrainosus: Secondary | ICD-10-CM

## 2021-02-20 NOTE — Procedures (Signed)
Full Name: Karen Whitehead Gender: Female MRN #: [id number] Date of Birth: 28-Feb-1966    Visit Date: 02/20/2021 08:57 Age: 55 Years Examining Physician: Marcial Pacas, MD  Referring Physician: Marcial Pacas, MD Height: 5 feet 4 inch Patient History: 29lbs History: 55 year old female complains of diffuse body achy pain  Summary of the test: Nerve conduction study: Left sural, superficial peroneal, median ulnar sensory responses were normal.  Left tibial, peroneal to EDB, median and ulnar motor responses were normal  Electromyography: Selected needle examination of left lower extremity muscles, left lumbosacral paraspinal muscles; left upper extremity muscles and left cervical paraspinal muscles were normal.  Conclusion: This is a normal study.  There is no electrodiagnostic evidence of intrinsic muscle disease, left cervical, left lumbar radiculopathy or peripheral neuropathy.    ------------------------------- Marcial Pacas, M.D. PhD  [facility] [address], [place], [place] [address] Tel: [phone] Fax: [phone]  Verbal informed consent was obtained from the patient, patient was informed of potential risk of procedure, including bruising, bleeding, hematoma formation, infection, muscle weakness, muscle pain, numbness, among others.        Karen Whitehead    Nerve / Sites Muscle Latency Ref. Amplitude Ref. Rel Amp Segments Distance Velocity Ref. Area    ms ms mV mV %  cm m/s m/s mVms  L Median - APB     Wrist APB 3.0 ?4.4 8.4 ?4.0 100 Wrist - APB 7   31.1     Upper arm APB 6.7  7.8  92.2 Upper arm - Wrist 21 57 ?49 27.8  L Ulnar - ADM     Wrist ADM 2.6 ?3.3 8.9 ?6.0 100 Wrist - ADM 7   26.7     B.Elbow ADM 5.8  8.8  99.3 B.Elbow - Wrist 16 51 ?49 26.1     A.Elbow ADM 7.7  7.5  85.6 A.Elbow - B.Elbow 10 51 ?49 24.1  L Peroneal - EDB     Ankle EDB 3.7 ?6.5 6.4 ?2.0 100 Ankle - EDB 9   23.1     Fib head EDB 9.7  5.9  92 Fib head - Ankle 27 45 ?44  21.5     Pop fossa EDB 12.0  5.8  97.7 Pop fossa - Fib head 10 44 ?44 21.3         Pop fossa - Ankle      L Tibial - AH     Ankle AH 3.4 ?5.8 10.1 ?4.0 100 Ankle - AH 9   16.4     Pop fossa AH 11.6  8.1  80.1 Pop fossa - Ankle 36 44 ?41 14.0             SNC    Nerve / Sites Rec. Site Peak Lat Ref.  Amp Ref. Segments Distance    ms ms V V  cm  L Sural - Ankle (Calf)     Calf Ankle 3.9 ?4.4 14 ?6 Calf - Ankle 14  L Superficial peroneal - Ankle     Lat leg Ankle 3.6 ?4.4 20 ?6 Lat leg - Ankle 14  L Median - Orthodromic (Dig II, Mid palm)     Dig II Wrist 2.7 ?3.4 19 ?10 Dig II - Wrist 13  L Ulnar - Orthodromic, (Dig V, Mid palm)     Dig V Wrist 2.6 ?3.1 9 ?5 Dig V - Wrist 11             F  Wave    Nerve F Lat Ref.   ms ms  L Tibial - AH 50.9 ?56.0  L Ulnar - ADM 27.4 ?32.0         EMG Summary Table    Spontaneous MUAP Recruitment  Muscle IA Fib PSW Fasc Other Amp Dur. Poly Pattern  L. First dorsal interosseous Normal None None None _______ Normal Normal Normal Normal  L. Pronator teres Normal None None None _______ Normal Normal Normal Normal  L. Biceps brachii Normal None None None _______ Normal Normal Normal Normal  L. Deltoid Normal None None None _______ Normal Normal Normal Normal  L. Triceps brachii Normal None None None _______ Normal Normal Normal Normal  L. Tibialis anterior Normal None None None _______ Normal Normal Normal Normal  L. Tibialis posterior Normal None None None _______ Normal Normal Normal Normal  L. Peroneus longus Normal None None None _______ Normal Normal Normal Normal  L. Gastrocnemius (Medial head) Normal None None None _______ Normal Normal Normal Normal  L. Vastus lateralis Normal None None None _______ Normal Normal Normal Normal  L. Cervical paraspinals Normal None None None _______ Normal Normal Normal Normal  L. Lumbar paraspinals (low) Normal None None None _______ Normal Normal Normal Normal  L. Lumbar paraspinals (mid) Normal None None  None _______ Normal Normal Normal Normal

## 2021-02-20 NOTE — Progress Notes (Addendum)
No chief complaint on file.     ASSESSMENT AND PLAN  Karen Whitehead is a 55 y.o. female   Constellation of complaints,  Including diffuse body achy pain, gait abnormality, low back pain, radiating pain to bilateral lower extremity, recent onset worsening left leg weakness  MRI of lumbar spine showed mild degenerative changes, no evidence of canal or foraminal narrowing  EMG nerve conduction study is essentially normal, no evidence of peripheral neuropathy or intrinsic muscle disease  Encouraged her to continue moderate exercise, water aerobic,  Already on polypharmacy treatment  Frequent headaches with migraine features  Depakote ER as preventive medications works well,  Imitrex 50 mg as needed was very helpful,  Vitamin D deficiency  Vitamin D level was 17,  Over-the-counter D3 supplement 1000 units daily  Continue follow-up with primary care physician only return to clinic for new issues    DIAGNOSTIC DATA (LABS, IMAGING, TESTING) - I reviewed patient records, labs, notes, testing and imaging myself where available. Sep 28 2020, MRI brain No acute or reversible finding. Generalized brain volume loss, more than usually seen at this age. Few scattered punctate foci T2 and FLAIR signal in the hemispheric white matter consistent mild chronic small vessel change.  Laboratory evaluation August 2022, CMP creatinine 1.03, lipid panel, LDL 185, A1c 9.8 in 2021, now 6.4,  Extensive laboratory evaluation October 2021, normal or negative anti-Smith antibody, Sjogren titer, C-reactive protein, ESR, NT DNA antibody, ANA, hepatitis B surface antigen, antibody, HCV,   MEDICAL HISTORY:  Karen Whitehead is a 55 year old female, seen in request by her primary care physician Dr. Margarita Rana, Charlane Ferretti, for evaluation of weakness, gait abnormality, declining functional status, initial evaluation was December 21, 2020    I reviewed and summarized the referring note. PMHX. 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, presented with high fever, diffuse body achy pain, weakness, was treated at ICU at [place] she was never the same since,  Shortly afterwards, she developed skin lesions, was diagnosed with granuloma annularis, could not tolerate steroid injection due to diabetes, she is now using cultivate cream intermittently, has multiple skin lesions involving neck, trunk, upper and lower extremities, she described extreme sun sensitivity, she felt skin unbearable burning pain when exposed to sun, often has painful skin paresthesia around the lesion  In addition, she was diagnosed with diabetes around 2019,  She was 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, she complains of memory loss, brain foggy sensation, diffuse body achy pain, gait abnormality, rely on her walker, patient has variable effort during examination, also very difficult to timeline her illness,  Today her main concern is frequent headaches couple times each week, lasting for half day, severe, rendering her dysfunction during the headache, also complains few months history of increased generalized weakness, body achy pain, especially left lower extremity, also complains of urinary frequency, occasionally incontinence, also complains of worsening low back pain, radiating pain to bilateral lower extremity  I personally reviewed MRI of the brain  without contrast Sep 28, 2020 moderate supratentorium small vessel disease no acute abnormalities  Update February 20, 2021: She return for electrodiagnostic study today, which was essentially normal Reviewed laboratory evaluations in 2022, normal or negative ESR, slight elevated C-reactive protein of 13, HIV, RPR, vitamin B12, thyroid panel, CPK, vitamin D level was 13,  Her migraine headache has much improved, she tried Imitrex couple times, which is very helpful, she is on polypharmacy treatment already, including amitriptyline, gabapentin, meloxicam, Seroquel, Zoloft, trazodone,  Personally reviewed MRI of lumbar in September 2022: Mild degenerative changes no significant canal or foraminal narrowing   PHYSICAL EXAM:   There were no vitals filed for this visit.  Not recorded     There is no height or weight on file to calculate BMI.  PHYSICAL EXAMNIATION:  Gen: NAD, conversant, well nourised, well groomed              NEUROLOGICAL EXAM:  MENTAL STATUS: Very depressed looking middle-aged female Speech: Dry mouth, slightly slurred speech, fluent and spontaneous with normal comprehension.  Cognition:    CRANIAL NERVES: CN II: Visual fields are full to confrontation. Pupils are round equal and briskly reactive to light. CN III, IV, VI: extraocular movement are normal. No ptosis. CN V: Facial sensation is intact to light touch CN VII: Face is symmetric with normal eye closure  CN VIII: Hearing is normal to causal conversation. CN IX, X: Phonation is normal. CN XI: Head turning and shoulder shrug are intact  MOTOR: Variable effort on examination, at least 4+ out of 5 bilateral upper and lower extremity proximal and distal muscle strengths  REFLEXES: Reflexes are absent and symmetric at the biceps, triceps, knees, and ankles. Plantar responses are flexor.  SENSORY: Intact to light touch, pinprick and vibratory sensation are intact in fingers and toes.  COORDINATION: There is no trunk or limb dysmetria noted.  GAIT/STANCE: She needs push-up on her walker, limited due to her body pain,  big body habitus, variable effort,  REVIEW OF SYSTEMS:  Full 14 system review of systems performed and notable only for as above All other review of systems were negative.   ALLERGIES: No Known Allergies  HOME MEDICATIONS: Current Outpatient Medications  Medication Sig Dispense Refill   albuterol (VENTOLIN HFA) 108 (90 Base) MCG/ACT inhaler INHALE 1-2 PUFFS INTO THE LUNGS EVERY 6 (SIX) HOURS AS NEEDED FOR WHEEZING OR SHORTNESS OF BREATH. 18 g 1   amitriptyline (ELAVIL) 25 MG tablet Take 1 tablet (25 mg total) by mouth at bedtime. (Patient not taking: Reported on 02/14/2021) 30 tablet 3   atorvastatin (LIPITOR) 40 MG tablet Take 1 tablet (40 mg total) by mouth daily. 90 tablet 3   Blood Glucose Monitoring Suppl (TRUE METRIX METER) w/Device KIT 1 each by Does not apply route in the morning and at bedtime. 1 kit 0   clonazePAM (KLONOPIN) 1 MG tablet Take 1 tablet by mouth twice daily as needed for anxiety 60 tablet 0   cyclobenzaprine (FLEXERIL) 10 MG tablet Take 1 tablet (10 mg total) by mouth 2 (two) times daily as needed for muscle spasms as needed for muscle pain 60 tablet 6   dapagliflozin propanediol (FARXIGA) 5 MG TABS tablet TAKE 1 TABLET (5 MG TOTAL) BY MOUTH DAILY BEFORE BREAKFAST. 30 tablet 3   divalproex (DEPAKOTE ER) 500 MG 24 hr tablet Take 1 tablet (500 mg total) by mouth at bedtime. 30 tablet 6   fluticasone (CUTIVATE) 0.005 % ointment Apply 1 application topically 2 (two)  times daily.     gabapentin (NEURONTIN) 300 MG capsule Take 1 capsule (300 mg total) by mouth 2 (two) times daily. 60 capsule 6   glipiZIDE (GLUCOTROL) 5 MG tablet TAKE 0.5 TABLETS (2.5 MG TOTAL) BY MOUTH 2 (TWO) TIMES DAILY BEFORE A MEAL. 30 tablet 3   glucose blood (TRUE METRIX BLOOD GLUCOSE TEST) test strip Use as instructed 100 each 12   glucose blood test strip USE AS INSTRUCTED 100 strip 12   lidocaine (LIDODERM) 5 % Place 2 patches onto the skin daily. Remove & Discard patch within 12 hours or as  directed by MD 30 patch 6   meloxicam (MOBIC) 15 MG tablet Take by mouth.     QUEtiapine (SEROQUEL) 200 MG tablet Take 1 tablet (200 mg total) by mouth at bedtime. 30 tablet 3   sertraline (ZOLOFT) 100 MG tablet TAKE 2 TABLETS BY MOUTH ONCE A DAY 60 tablet 3   SUMAtriptan (IMITREX) 50 MG tablet Take 1 tablet (50 mg total) by mouth every 2 (two) hours as needed for migraine. May repeat in 2 hours if headache persists or recurs. 12 tablet 6   topiramate (TOPAMAX) 25 MG tablet Take 1 tablet (25 mg total) by mouth 2 (two) times daily. 60 tablet 3   traZODone (DESYREL) 100 MG tablet TAKE 2 TABLETS BY MOUTH AT BEDTIME 60 tablet 3   TRUEplus Lancets 28G MISC Use as directed in the morning and at bedtime. 100 each 1   No current facility-administered medications for this visit.    PAST MEDICAL HISTORY: Past Medical History:  Diagnosis Date   Granuloma annulare     PAST SURGICAL HISTORY: Past Surgical History:  Procedure Laterality Date   APPENDECTOMY      FAMILY HISTORY: Family History  Problem Relation Age of Onset   Stomach cancer Mother    Heart attack Father    Breast cancer Neg Hx     SOCIAL HISTORY: Social History   Socioeconomic History   Marital status: Single    Spouse name: Not on file   Number of children: 1   Years of education: Not on file   Highest education level: Some college, no degree  Occupational History   Not on file  Tobacco Use   Smoking status: Every Day    Packs/day: 0.50    Types: Cigarettes   Smokeless tobacco: Never  Vaping Use   Vaping Use: Never used  Substance and Sexual Activity   Alcohol use: No   Drug use: No   Sexual activity: Not Currently  Other Topics Concern   Not on file  Social History Narrative   Not on file   Social Determinants of Health   Financial Resource Strain: Not on file  Food Insecurity: No Food Insecurity   Worried About Running Out of Food in the Last Year: Never true   Ran Out of Food in the Last Year:  Never true  Transportation Needs: No Transportation Needs   Lack of Transportation (Medical): No   Lack of Transportation (Non-Medical): No  Physical Activity: Not on file  Stress: Not on file  Social Connections: Not on file  Intimate Partner Violence: Not on file      Marcial Pacas, M.D. Ph.D.  [facility] [address] [place], [place] [address] Ph: [phone] Fax: [phone]  CC:  Charlott Rakes, MD [place],  [place] [address]  Charlott Rakes, MD

## 2021-02-21 ENCOUNTER — Encounter (HOSPITAL_BASED_OUTPATIENT_CLINIC_OR_DEPARTMENT_OTHER): Payer: No Typology Code available for payment source | Admitting: Physical Therapy

## 2021-02-26 ENCOUNTER — Encounter (HOSPITAL_BASED_OUTPATIENT_CLINIC_OR_DEPARTMENT_OTHER): Payer: No Typology Code available for payment source | Admitting: Physical Therapy

## 2021-02-26 ENCOUNTER — Encounter (HOSPITAL_BASED_OUTPATIENT_CLINIC_OR_DEPARTMENT_OTHER): Payer: Self-pay

## 2021-03-04 ENCOUNTER — Ambulatory Visit: Payer: Self-pay | Admitting: Physical Medicine and Rehabilitation

## 2021-03-05 ENCOUNTER — Other Ambulatory Visit (HOSPITAL_COMMUNITY): Payer: Self-pay | Admitting: Psychiatry

## 2021-03-05 DIAGNOSIS — F339 Major depressive disorder, recurrent, unspecified: Secondary | ICD-10-CM

## 2021-03-06 MED FILL — Glipizide Tab 5 MG: ORAL | 30 days supply | Qty: 30 | Fill #1 | Status: CN

## 2021-03-06 MED FILL — Glucose Blood Test Strip: 25 days supply | Qty: 100 | Fill #1 | Status: CN

## 2021-03-07 ENCOUNTER — Other Ambulatory Visit: Payer: Self-pay

## 2021-03-08 ENCOUNTER — Encounter
Payer: No Typology Code available for payment source | Attending: Physical Medicine and Rehabilitation | Admitting: Physical Medicine and Rehabilitation

## 2021-03-08 ENCOUNTER — Other Ambulatory Visit: Payer: Self-pay

## 2021-03-08 ENCOUNTER — Encounter: Payer: Self-pay | Admitting: Physical Medicine and Rehabilitation

## 2021-03-08 VITALS — BP 128/85 | HR 85 | Temp 98.5°F | Ht 64.0 in | Wt 229.0 lb

## 2021-03-08 DIAGNOSIS — M47817 Spondylosis without myelopathy or radiculopathy, lumbosacral region: Secondary | ICD-10-CM | POA: Insufficient documentation

## 2021-03-08 DIAGNOSIS — M7918 Myalgia, other site: Secondary | ICD-10-CM | POA: Insufficient documentation

## 2021-03-08 MED ORDER — TOPIRAMATE 50 MG PO TABS
50.0000 mg | ORAL_TABLET | Freq: Two times a day (BID) | ORAL | 3 refills | Status: AC
  Filled 2021-03-08 – 2021-03-19 (×2): qty 60, 30d supply, fill #0
  Filled 2021-04-23: qty 60, 30d supply, fill #1
  Filled 2021-05-22: qty 60, 30d supply, fill #0
  Filled 2021-06-25: qty 60, 30d supply, fill #1

## 2021-03-08 NOTE — Progress Notes (Signed)
Trigger Point Injection  Indication: Lumbar myofascial pain not relieved by medication management and other conservative care.  Informed consent was obtained after describing risk and benefits of the procedure with the patient, this includes bleeding, bruising, infection and medication side effects.  The patient wishes to proceed and has given written consent.  The patient was placed in a seated position.  The area of pain was marked and prepped with Betadine.  It was entered with a 25-gauge 1/2 inch needle and a total of 5 mL of 1% lidocaine and normal saline was injected into a total of 6 trigger points, after negative draw back for blood.  The patient tolerated the procedure well.  Post procedure instructions were given.

## 2021-03-08 NOTE — Addendum Note (Signed)
Addended by: Izora Ribas on: 03/08/2021 02:46 PM   Modules accepted: Orders

## 2021-03-11 ENCOUNTER — Ambulatory Visit (HOSPITAL_BASED_OUTPATIENT_CLINIC_OR_DEPARTMENT_OTHER): Payer: No Typology Code available for payment source | Attending: Family Medicine | Admitting: Physical Therapy

## 2021-03-11 ENCOUNTER — Other Ambulatory Visit: Payer: Self-pay

## 2021-03-11 ENCOUNTER — Encounter (HOSPITAL_BASED_OUTPATIENT_CLINIC_OR_DEPARTMENT_OTHER): Payer: Self-pay | Admitting: Physical Therapy

## 2021-03-11 DIAGNOSIS — M25562 Pain in left knee: Secondary | ICD-10-CM | POA: Insufficient documentation

## 2021-03-11 DIAGNOSIS — M6281 Muscle weakness (generalized): Secondary | ICD-10-CM

## 2021-03-11 DIAGNOSIS — M25561 Pain in right knee: Secondary | ICD-10-CM | POA: Insufficient documentation

## 2021-03-11 DIAGNOSIS — R278 Other lack of coordination: Secondary | ICD-10-CM

## 2021-03-11 DIAGNOSIS — R296 Repeated falls: Secondary | ICD-10-CM

## 2021-03-11 DIAGNOSIS — R262 Difficulty in walking, not elsewhere classified: Secondary | ICD-10-CM

## 2021-03-11 DIAGNOSIS — G8929 Other chronic pain: Secondary | ICD-10-CM

## 2021-03-11 DIAGNOSIS — M545 Low back pain, unspecified: Secondary | ICD-10-CM | POA: Insufficient documentation

## 2021-03-11 NOTE — Therapy (Signed)
[facility] [address] [place], [place], [address] Phone: [phone]   Fax:  [phone]  Physical Therapy Treatment/Progress Notee  Progress Note Reporting Period 01/15/21 to 03/11/2021  See note below for Objective Data and Assessment of Progress/Goals.      Patient Details  Name: Karen Whitehead MRN: [id number] Date of Birth: 11-Feb-1966 Referring Provider (PT): Dr. Charlott Rakes   Encounter Date: 03/11/2021   PT End of Session - 03/11/21 2053     Visit Number 10    Number of Visits 22    Date for PT Re-Evaluation 04/22/21    Progress Note Due on Visit --   04/10/21   PT Start Time 1115    PT Stop Time 1147    PT Time Calculation (min) 32 min    Activity Tolerance --   Pt required assistance with mobility and has limited tolerance.   Behavior During Therapy WFL for tasks assessed/performed             Past Medical History:  Diagnosis Date   Granuloma annulare     Past Surgical History:  Procedure Laterality Date   APPENDECTOMY      There were no vitals filed for this visit.   Subjective Assessment - 03/11/21 1121     Subjective Pt was last seen on 01/29/2021.  Pt wasn't feeling good and was having various testing performed.  Pt states she had nerve testing which sounds like an EMG/NCV and pt states her nerves were fine.  Pt states the only way she can become comfortable is lying down.  Pt continues to have constant lumbar pain.  Her pain has been worsening.  Pt received injections on Friday and reports she feels better.  Pt reports no functional improvement.  She states her walking ha been the same, not improved, except she is having less back pain with walking since the injections on Friday.  Pt states she felt better in the water and her body is more at ease.  Pt is having further injections on 03/21/21.  Pt also states she has an appt later this week with MD concerning if she needs a procedure to remove  polyps.    Pertinent History Fall Risk.  Granuloma Annulare, major depressive disoder, legionnairre's disease, dengue fever, DM, memory loss, anxiety.    Limitations Sitting;Walking;Standing;House hold activities    Diagnostic tests Lumbar MRI:  L1-L4 mild disc bulging, but no spinal stenosis or nn root compression.  L4-5 minimal disc bulging.   L5-S1: disc and interspace appear normal.  IMPRESSION:  mild multilevel degenerative changes that do not show spinal stenosis or nerve root compression at any of the levels.         Brain MRI:  IMPRESSION:  No acute or reversible finding. Generalized brain volume loss, more  than usually seen at this age  per MD note.  Pt's hip and knee x ray are negative.    Currently in Pain? Yes    Pain Score --   7-8/10 current, 10/10 worst, 3-4/10 best   Pain Location --   lower thoracic and lumbar               [facility] PT Assessment - 03/11/21 0001       Observation/Other Assessments   Other Surveys  Modified Oswestry    Modified Oswestry 84%      Functional Tests   Functional tests --   TUG: 74mn15 sec with rollator.  She required moderate assist  assist with standing up and min assist with safely sitting down.     Transfers   Comments Pt required moderte assist and instruction in correct form/positiong with transfers.  Pt unable to stand without assistance.      Ambulation/Gait   Assistive device Rollator    Gait Comments Pt has decreased foot clearance and step length bilat.  Pt has a very slow gait speed.  No improvements in gait though she did not have a freezing episode today.                                    PT Education - 03/11/21 2052     Education Details POC and objective findings    Person(s) Educated Patient    Methods Explanation;Demonstration    Comprehension Verbalized understanding;Returned demonstration              PT Short Term Goals - 03/11/21 2103       PT SHORT TERM GOAL #1   Title Pt  will be able to perform sit to stand transfers safely and independently.    Baseline requires assistance    Time 6    Period Weeks    Status Not Met    Target Date 04/22/21      PT SHORT TERM GOAL #2   Title Pt will report at least a 25% improvement overall in pain and mobility.    Time 4    Period Weeks    Status Not Met    Target Date 04/08/21      PT SHORT TERM GOAL #3   Title Pt will ambulate at least 100 ft with improved gait speed and correct usage of walker.    Time 4    Period Weeks    Status Not Met    Target Date 04/08/21      PT SHORT TERM GOAL #5   Title Pt will demo improved LE strength to 4- to 4/5 MMT in hip flexion, knee flex and extension, and DF bilat for improved mobility, ambulation, and tolerance to activity.    Baseline Not assessed    Time 6    Period Weeks    Status Deferred    Target Date 04/22/21      PT SHORT TERM GOAL #6   Title Pt will report improved standing duration at home to improve performance of self care activities and light IADLs.    Time 4    Period Weeks    Status Not Met    Target Date 04/08/21               PT Long Term Goals - 03/11/21 2105       PT LONG TERM GOAL #1   Title Pt will ambulate at least 200 ft with walker with no > than SBA/CGA without having to sit down    Time 6    Period Weeks    Status On-going    Target Date 04/22/21      PT LONG TERM GOAL #2   Title Pt will tolerate aquatic therapy without significant increased pain in order to establish an exercise routine to improve functional mobility and tolerance to activity.    Time 6    Period Weeks    Status On-going    Target Date 04/22/21      PT LONG TERM GOAL #3   Title Pt will be able to  perform TUG in no > than 20 sec for improved mobility and gait speed.    Time 6    Period Weeks    Status Not Met    Target Date 04/22/21      PT LONG TERM GOAL #4   Title Pt will be able to ambulate community distance with walker without significant pain or  difficulty.    Time 6    Period Weeks    Status Not Met    Target Date 04/22/21      PT LONG TERM GOAL #5   Title Pt will report she is able to perform bed mobility, self care activities, and some light IADLs in her home without signifiant back and LE pain.    Time 6    Period Weeks    Status Not Met    Target Date 04/22/21      PT LONG TERM GOAL #6   Title Pt will be able to perform aquatic program with appropriate assistance without increased pain for improved function, mobility, balance, strength, and activity tolerance.    Time 6    Period Weeks    Status Not Met    Target Date 04/22/21      PT LONG TERM GOAL #7   Title Pt will report she is able to perform stairs at home safely without assistance to enter/exit home.    Time 6    Period Weeks    Status Not Met    Target Date 04/22/21      PT LONG TERM GOAL #8   Time 6    Period Weeks    Status Not Met    Target Date 04/22/21                   Plan - 03/11/21 1152     Clinical Impression Statement Pt has been absent from PT since 01/29/2021.  She is not completely sure why she has been absent from PT that long though has had various testing performed and transportation issues.  She continues to have high levels of pain and continues to have constant pain.  Pt has not been having any improvement in pain until recenlty having injections on Friday.  Objective measures were limited today due to pt having recent injections.  Pt also has limited tolerance to activity as well.  Pt reports no functional improvement.  She was ambulating with rollator today.  Pt is very slow with gait but was able to advance LEs today and had no freezing episodes.  Pt continues to require assistance with performing sit to stand transfers and required increased assistance with transfers today.  She had no change in modified Oswestry.  Pt had a worse TUG score today compared to prior testing.  Pt was very limited with performing aquatic exercises  though states she felt better in the water and felt that she could do more in the water.  Pt is not progressing toward her goals.  Pt may benefit from continued aquatic therapy including a consistent aquatic therapy program to improve function, mobility, strength, pain, and tolerance to activity.    Comorbidities Fall Risk. Granuloma Annulare, major depressive disorder. Hx of legionnairre's disease and dengue fever, DM    PT Frequency 2x / week    PT Duration 6 weeks    PT Treatment/Interventions ADLs/Self Care Home Management;Aquatic Therapy;Cryotherapy;Electrical Stimulation;Ultrasound;Moist Heat;Gait training;Stair training;Functional mobility training;Therapeutic activities;Therapeutic exercise;Balance training;Neuromuscular re-education;Manual techniques;Patient/family education    PT Next Visit Plan Cont with aquatic  therapy.  Cont using the water walker with ambulation in the pool and probably the chair lift to enter/exit the pool. Pt is possibly having a procedure performed concerning polyps and may not be able to attend aquatic PT for awhile.  Pt understands to contact PT and let us know what decision is made.    Consulted and Agree with Plan of Care Patient             Patient will benefit from skilled therapeutic intervention in order to improve the following deficits and impairments:  Abnormal gait, Decreased activity tolerance, Decreased balance, Decreased mobility, Decreased endurance, Decreased range of motion, Decreased safety awareness, Decreased strength, Difficulty walking, Pain  Visit Diagnosis: Chronic low back pain, unspecified back pain laterality, unspecified whether sciatica present  Muscle weakness (generalized)  Difficulty in walking, not elsewhere classified  Chronic pain of both knees  Other lack of coordination  Repeated falls     Problem List Patient Active Problem List   Diagnosis Date Noted   Vitamin D deficiency 02/20/2021   Weakness 12/21/2020    Complaints of total body pain 12/21/2020   Chronic migraine w/o aura w/o status migrainosus, not intractable 12/21/2020   Bilateral low back pain with bilateral sciatica 12/21/2020   Major depressive disorder, recurrent episode with anxious distress ([place]) 03/14/2020   Granuloma annulare 11/03/2019   History of dengue 11/03/2019   History of Legionnaire's disease 11/03/2019   Selinda Michaels III PT, DPT 03/11/21 9:22 PM   [facility] [address] [place], [place], [address] Phone: [phone]   Fax:  [phone]  Name: Karen Whitehead MRN: [id number] Date of Birth: 23-Apr-1966

## 2021-03-12 ENCOUNTER — Telehealth (HOSPITAL_COMMUNITY): Payer: Self-pay | Admitting: *Deleted

## 2021-03-12 NOTE — Telephone Encounter (Signed)
VM from patient, she states she would like to speak with Dr Ronne Binning. She spoke to a or several family members recently about how her "medicine keeps her so down" and family told her a lot of members have bipolar type 2 and take Risperidone. She stopped her medicine and would like to speak with her provider about getting on Risperidone. Will forward concern to [facility] NP

## 2021-03-13 ENCOUNTER — Other Ambulatory Visit: Payer: Self-pay

## 2021-03-13 ENCOUNTER — Other Ambulatory Visit (HOSPITAL_COMMUNITY): Payer: Self-pay | Admitting: Psychiatry

## 2021-03-13 DIAGNOSIS — F3181 Bipolar II disorder: Secondary | ICD-10-CM

## 2021-03-13 MED ORDER — RISPERIDONE 1 MG PO TABS
1.0000 mg | ORAL_TABLET | Freq: Two times a day (BID) | ORAL | 3 refills | Status: DC
  Filled 2021-03-13: qty 60, 30d supply, fill #0
  Filled 2021-04-11: qty 60, 30d supply, fill #1

## 2021-03-13 NOTE — Telephone Encounter (Signed)
Provider spoke to patient who notes that she has been feeling mentally unstable. She notes that she suffered from fluctuations in mood since she was 17.  She notes that it was ignored because her family did not believe in treatment.  She informed Probation officer that recently her cousin described her symptoms to her and she notes that she related to her.  She notes that at times she is irritable, distractible, has racing thoughts, and fluctuations in mood.  She informed Probation officer that in the past she was very impulsive however notes that due to her physical health she is unable to do impulsive things.  She also informed Probation officer that recently she feels that she has been hearing things.  She notes that she hears knocking at the door however when she goes to the door there is no one there.  Provider informed patient her symptoms are consistent with bipolar 2.  She notes that she finds Seroquel ineffective and would like to discontinue it.  Provider informed patient to reduce her Seroquel 200 mg to 100 mg for a week and then discontinue prior to starting Risperdal 1 mg twice daily.  She endorsed understanding and agreed. Potential side effects of medication and risks vs benefits of treatment vs non-treatment were explained and discussed. All questions were answered.  No other concerns at this time.

## 2021-03-14 ENCOUNTER — Other Ambulatory Visit: Payer: Self-pay

## 2021-03-14 ENCOUNTER — Ambulatory Visit (INDEPENDENT_AMBULATORY_CARE_PROVIDER_SITE_OTHER): Payer: No Typology Code available for payment source | Admitting: Family Medicine

## 2021-03-14 ENCOUNTER — Encounter: Payer: Self-pay | Admitting: Family Medicine

## 2021-03-14 ENCOUNTER — Other Ambulatory Visit (HOSPITAL_COMMUNITY)
Admission: RE | Admit: 2021-03-14 | Discharge: 2021-03-14 | Disposition: A | Discharge status: Home / Self Care | Payer: No Typology Code available for payment source | Source: Ambulatory Visit | Attending: Family Medicine | Admitting: Family Medicine

## 2021-03-14 VITALS — BP 127/64 | HR 84 | Wt 232.7 lb

## 2021-03-14 DIAGNOSIS — N841 Polyp of cervix uteri: Secondary | ICD-10-CM

## 2021-03-14 DIAGNOSIS — R8290 Unspecified abnormal findings in urine: Secondary | ICD-10-CM

## 2021-03-14 DIAGNOSIS — R87612 Low grade squamous intraepithelial lesion on cytologic smear of cervix (LGSIL): Secondary | ICD-10-CM

## 2021-03-14 DIAGNOSIS — N898 Other specified noninflammatory disorders of vagina: Secondary | ICD-10-CM

## 2021-03-14 MED FILL — Glucose Blood Test Strip: 25 days supply | Qty: 100 | Fill #1 | Status: AC

## 2021-03-14 MED FILL — Glipizide Tab 5 MG: ORAL | 30 days supply | Qty: 30 | Fill #1 | Status: AC

## 2021-03-14 NOTE — Assessment & Plan Note (Signed)
S/p colpo--manage accordingly

## 2021-03-14 NOTE — Progress Notes (Signed)
Patient has elevated PHQ-9 and GAD-7. Declined [facility], stating she receives therapy elsewhere.   Paulina Fusi, RN

## 2021-03-14 NOTE — Progress Notes (Signed)
    Subjective:    Patient ID: Karen Whitehead is a 55 y.o. female presenting with Colposcopy  on 03/14/2021  HPI: Seen with PCP and cervical polyp noted. Pap showed LGSIL. Needs colpo. Also notes malodorous urine and vaginal discharge.  Review of Systems  Constitutional:  Negative for chills and fever.  Respiratory:  Negative for shortness of breath.   Cardiovascular:  Negative for chest pain.  Gastrointestinal:  Negative for abdominal pain, nausea and vomiting.  Genitourinary:  Negative for dysuria.  Skin:  Negative for rash.     Objective:    BP 127/64   Pulse 84   Wt 232 lb 11.2 oz (105.6 kg)   BMI 39.94 kg/m  Physical Exam Constitutional:      General: She is not in acute distress.    Appearance: She is well-developed.  HENT:     Head: Normocephalic and atraumatic.  Eyes:     General: No scleral icterus. Cardiovascular:     Rate and Rhythm: Normal rate.  Pulmonary:     Effort: Pulmonary effort is normal.  Abdominal:     Palpations: Abdomen is soft.  Musculoskeletal:     Cervical back: Neck supple.  Skin:    General: Skin is warm and dry.  Neurological:     Mental Status: She is alert and oriented to person, place, and time.   PROCEDURE NOTE Cervix visualized and polyp noted.  Ring forcep applied to cervix and twisting motion removed polyp intact.    PROCEDURE NOTE Colposcopy for low-grade squamous intraepithelial neoplasia (LGSIL - encompassing HPV,mild dysplasia,CIN I) pap smear on 12/17/2020.   Patient gave informed written consent, time out was performed.  Placed in lithotomy position. Cervix viewed with speculum and colposcope after application of acetic acid.   Colposcopy adequate? Yes  no visible lesions; 3 biopsies obtained.  ECC specimen obtained. All specimens were labeled and sent to pathology.      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  Donnamae Jude 03/14/2021 2:40 PM

## 2021-03-15 ENCOUNTER — Other Ambulatory Visit: Payer: Self-pay

## 2021-03-15 LAB — POCT URINALYSIS DIP (DEVICE)
Glucose, UA: 250 mg/dL — AB
Ketones, ur: NEGATIVE mg/dL
Leukocytes,Ua: NEGATIVE
Nitrite: NEGATIVE
Protein, ur: NEGATIVE mg/dL
Specific Gravity, Urine: 1.02 (ref 1.005–1.030)
Urobilinogen, UA: 0.2 mg/dL (ref 0.0–1.0)
pH: 5.5 (ref 5.0–8.0)

## 2021-03-15 LAB — CERVICOVAGINAL ANCILLARY ONLY
Bacterial Vaginitis (gardnerella): POSITIVE — AB
Candida Glabrata: NEGATIVE
Candida Vaginitis: NEGATIVE
Chlamydia: NEGATIVE
Comment: NEGATIVE
Comment: NEGATIVE
Comment: NEGATIVE
Comment: NEGATIVE
Comment: NEGATIVE
Comment: NORMAL
Neisseria Gonorrhea: NEGATIVE
Trichomonas: NEGATIVE

## 2021-03-16 MED ORDER — METRONIDAZOLE 500 MG PO TABS
500.0000 mg | ORAL_TABLET | Freq: Two times a day (BID) | ORAL | 0 refills | Status: DC
Start: 2021-03-16 — End: 2021-05-10
  Filled 2021-03-16: qty 14, 7d supply, fill #0

## 2021-03-18 ENCOUNTER — Encounter (HOSPITAL_COMMUNITY): Payer: Self-pay | Admitting: Emergency Medicine

## 2021-03-18 ENCOUNTER — Emergency Department (HOSPITAL_COMMUNITY): Payer: No Typology Code available for payment source

## 2021-03-18 ENCOUNTER — Other Ambulatory Visit: Payer: Self-pay

## 2021-03-18 ENCOUNTER — Emergency Department (HOSPITAL_COMMUNITY)
Admission: EM | Admit: 2021-03-18 | Discharge: 2021-03-19 | Disposition: A | Discharge status: Home / Self Care | Payer: No Typology Code available for payment source | Attending: Emergency Medicine | Admitting: Emergency Medicine

## 2021-03-18 DIAGNOSIS — R296 Repeated falls: Secondary | ICD-10-CM | POA: Insufficient documentation

## 2021-03-18 DIAGNOSIS — Z5321 Procedure and treatment not carried out due to patient leaving prior to being seen by health care provider: Secondary | ICD-10-CM | POA: Insufficient documentation

## 2021-03-18 DIAGNOSIS — W1839XA Other fall on same level, initial encounter: Secondary | ICD-10-CM | POA: Insufficient documentation

## 2021-03-18 DIAGNOSIS — S8991XA Unspecified injury of right lower leg, initial encounter: Secondary | ICD-10-CM | POA: Insufficient documentation

## 2021-03-18 LAB — SURGICAL PATHOLOGY

## 2021-03-18 NOTE — ED Provider Notes (Signed)
Emergency Medicine Provider Triage Evaluation Note  Karen Whitehead , a 55 y.o. female  was evaluated in triage.  Pt complains of right knee pain x 1 week. Patient states she was using her walker and fell directly onto her right knee. She endorses multiple falls, chronic back pain, and diabetic neuropathy. States her pain is made worse with extension of her right knee with intermittent radiation into her calf.   Review of Systems  Positive: Right knee pain Negative: Numbness, tingling  Physical Exam  BP (!) 135/99   Pulse 84   Temp 97.9 F (36.6 C) (Oral)   Resp 16   SpO2 95%  Gen:   Awake, no distress   Resp:  Normal effort  MSK:   Moves extremities without difficulty Other:  Pain behind right knee, no calf swelling, good pulses bilaterally  Medical Decision Making  Medically screening exam initiated at 3:35 PM.  Appropriate orders placed.  Karen Whitehead was informed that the remainder of the evaluation will be completed by another provider, this initial triage assessment does not replace that evaluation, and the importance of remaining in the ED until their evaluation is complete.     Estill Cotta 03/18/21 1536    Lucrezia Starch, MD 03/19/21 [phone]

## 2021-03-18 NOTE — ED Notes (Signed)
Pt stated that she is getting an MRI with her primary care doctor and is going home to rest. Pt was told that if her symptoms worsen to return

## 2021-03-18 NOTE — ED Triage Notes (Addendum)
Pt here d/t a fall X1 week ago. C/O right knee pain. Pt endorses multiple falls. Uses walker at home. Denies other injuries. Pulses intact

## 2021-03-19 ENCOUNTER — Ambulatory Visit: Payer: Self-pay

## 2021-03-19 ENCOUNTER — Other Ambulatory Visit: Payer: Self-pay

## 2021-03-19 ENCOUNTER — Ambulatory Visit (HOSPITAL_BASED_OUTPATIENT_CLINIC_OR_DEPARTMENT_OTHER): Payer: No Typology Code available for payment source | Admitting: Physical Therapy

## 2021-03-19 NOTE — Telephone Encounter (Signed)
Routing to PCP for review.

## 2021-03-19 NOTE — Telephone Encounter (Signed)
Pt. Fell 1 week ago, fell on on right knee. Went to ED yesterday. Still has pain behind the knee and pain when walking is 10/10. Swelling and bruising has gone down. Wants to know from PCP what she should do next. Please advise pt.      Answer Assessment - Initial Assessment Questions 1. MECHANISM: "How did the injury happen?" (e.g., twisting injury, direct blow)      Fell 2. ONSET: "When did the injury happen?" (Minutes or hours ago)      1 week ago 3. LOCATION: "Where is the injury located?"      Right knee 4. APPEARANCE of INJURY: "What does the injury look like?"      It was bruised 5. SEVERITY: "Can you put weight on that leg?" "Can you walk?"      Moderate 6. SIZE: For cuts, bruises, or swelling, ask: "How large is it?" (e.g., inches or centimeters;  entire joint)      None 7. PAIN: "Is there pain?" If Yes, ask: "How bad is the pain?"  "What does it keep you from doing?" (e.g., Scale 1-10; or mild, moderate, severe)   -  NONE: (0): no pain   -  MILD (1-3): doesn't interfere with normal activities    -  MODERATE (4-7): interferes with normal activities (e.g., work or school) or awakens from sleep, limping    -  SEVERE (8-10): excruciating pain, unable to do any normal activities, unable to walk     Walking - 10 8. TETANUS: For any breaks in the skin, ask: "When was the last tetanus booster?"     Unsure 9. OTHER SYMPTOMS: "Do you have any other symptoms?"  (e.g., "pop" when knee injured, swelling, locking, buckling)      No 10. PREGNANCY: "Is there any chance you are pregnant?" "When was your last menstrual period?"       No  Protocols used: Knee Injury-A-AH

## 2021-03-19 NOTE — Telephone Encounter (Signed)
Please advise 

## 2021-03-21 ENCOUNTER — Encounter (HOSPITAL_BASED_OUTPATIENT_CLINIC_OR_DEPARTMENT_OTHER): Payer: Self-pay | Admitting: Physical Medicine & Rehabilitation

## 2021-03-21 ENCOUNTER — Other Ambulatory Visit: Payer: Self-pay

## 2021-03-21 ENCOUNTER — Encounter: Payer: Self-pay | Admitting: Physical Medicine & Rehabilitation

## 2021-03-21 VITALS — BP 127/85 | HR 83 | Temp 98.4°F | Ht 64.0 in | Wt 232.0 lb

## 2021-03-21 DIAGNOSIS — M47817 Spondylosis without myelopathy or radiculopathy, lumbosacral region: Secondary | ICD-10-CM

## 2021-03-21 NOTE — Progress Notes (Signed)
  [facility]   Name: Karen Whitehead DOB:[birth year] MRN: [id number]  Date:03/21/2021  Physician: Alysia Penna, MD    Nurse/CMA: Traeh Milroy RN  Allergies: No Known Allergies  Consent Signed: Yes.    Is patient diabetic? Yes.    CBG today? 131  Pregnant: No. LMP: No LMP recorded. (Menstrual status: Perimenopausal). (age [age])  Anticoagulants: no Anti-inflammatory: no Antibiotics: yes (Flagyl)  Procedure: right medial branch block s Position: Prone Start Time: 10:33  End Time: 10:39  Fluoro Time: 39 sec  RN/CMA Truman Hayward, CMA Gizell Danser RN    Time 10:10 am 10:46    BP 127/85 129/87    Pulse 83 100    Respirations 16 16    O2 Sat 92 95    S/S 6 6    Pain Level 8/10 unchanged     D/C home with son, patient A & O X 3, D/C instructions reviewed, and sits independently.

## 2021-03-21 NOTE — Patient Instructions (Signed)

## 2021-03-21 NOTE — Progress Notes (Signed)
Right lumbar L2, L3, L4 medial branch blocks under fluoroscopic guidance  Indication: Right Lumbar pain which is not relieved by medication management or other conservative care and interfering with self-care and mobility.  Informed consent was obtained after describing risks and benefits of the procedure with the patient, this includes bleeding, bruising, infection, paralysis and medication side effects. The patient wishes to proceed and has given written consent. The patient was placed in a prone position. The lumbar area was marked and prepped with Betadine. One ML of 1% lidocaine was injected into each of 3 areas into the skin and subcutaneous tissue. Then a 22-gauge 5in spinal needle was inserted targeting the junction of the L3 SAP/trans process jct  Needle was advanced under fluoroscopic guidance. Bone contact was made.Isovue 200 was injected x0.5 mL demonstrating no intravascular uptake. Then a solution containing 2% MPF lidocaine was injected x0.5 mL. Then the Right L5 superior articular process in transverse process junction was targeted. Bone contact was made.Isovue 200 was injected x0.5 mL demonstrating no intravascular uptake. Then a solution containing 2% MPF lidocaine was injected x0.5 mL. Then the Right L4 superior articular process in transverse process junction was targeted. Bone contact was made. Isovue 200 was injected x0.5 mL demonstrating no intravascular uptake. Then a solution containing2% MPF lidocaine was injected x0.5 mL Patient tolerated procedure well. Post procedure instructions were given. Please refer to post procedure form.

## 2021-03-22 ENCOUNTER — Encounter: Payer: Self-pay | Admitting: Internal Medicine

## 2021-03-25 ENCOUNTER — Telehealth: Payer: Self-pay

## 2021-03-25 NOTE — Telephone Encounter (Signed)
Patient left a voicemail stating she has not been able to walk since her procedure on 03/21/21. She was just able to stand up today. She would like to know what to do. Please advise.

## 2021-03-26 ENCOUNTER — Ambulatory Visit (HOSPITAL_BASED_OUTPATIENT_CLINIC_OR_DEPARTMENT_OTHER): Payer: No Typology Code available for payment source | Admitting: Physical Therapy

## 2021-03-26 NOTE — Telephone Encounter (Signed)
Spoke with patient and informed her that she did not have a serious reaction to the MBB. Informed her to follow up with Dr. Ranell Patrick per Dr. Letta Pate. Patient is scheduled with her on 05/16/21.

## 2021-03-27 ENCOUNTER — Encounter (HOSPITAL_COMMUNITY): Payer: Self-pay

## 2021-03-27 ENCOUNTER — Ambulatory Visit (INDEPENDENT_AMBULATORY_CARE_PROVIDER_SITE_OTHER): Payer: No Payment, Other | Admitting: Licensed Clinical Social Worker

## 2021-03-27 DIAGNOSIS — F332 Major depressive disorder, recurrent severe without psychotic features: Secondary | ICD-10-CM

## 2021-04-01 NOTE — Progress Notes (Signed)
THERAPIST PROGRESS NOTE   Virtual Visit via Video Note  I connected with Dumas on 03/27/21 at  2:00 PM EST by a video enabled telemedicine application and verified that I am speaking with the correct person using two identifiers.  Location: Patient: Home Provider: [facility]   I discussed the limitations of evaluation and management by telemedicine and the availability of in person appointments. The patient expressed understanding and agreed to proceed. I discussed the assessment and treatment plan with the patient. The patient was provided an opportunity to ask questions and all were answered. The patient agreed with the plan and demonstrated an understanding of the instructions.   The patient was advised to call back or seek an in-person evaluation if the symptoms worsen or if the condition fails to improve as anticipated.  I provided 45 minutes of non-face-to-face time during this encounter.  Participation Level: Active  Behavioral Response: CasualAlertDepressed  Type of Therapy: Individual Therapy  Treatment Goals addressed: Communication: dep/anx/coping  Interventions: Solution Focused and Supportive  Summary: QUEEN ABBETT is a 55 y.o. female who presents with hx of dep/anx. This date patient logs on successfully for video session.  Patient is noted to be in extremely low mood and verifies same.  Evoleth provides updates on all of her physical health challenges contributing to her mental health challenges.  Patient reports she has had numerous injections in her back with only limited relief.  She reports she continues to deal with unmanaged pain and states pain management clinic did not give her any oral medication for pain.  Patient expects continuous follow-up with pain management clinic and will again ask about oral medications to manage her pain.  When asked, Naraya reports the biopsy of her uterus was negative for cancer and admits this is a big relief. Patient reports a  recent fall injuring her right knee.  She advises of extreme difficulty with ambulation and is now depending on a wheelchair at times for mobility.  Patient states "I cannot do anything".  Shakendra states she has stopped her aquatic therapy because she cannot get up and get out of the house by herself to meet transportation.  LCSW assessed for status of sister.  Sister reportedly remains in town but is still not able to drive since having her surgery as her recovery has not gone well.  When asked, patient reports she has had a medication change since last session and does feel like the medication change has been helpful saying she is "not over thinking as much".  Patient does complain about significant weight gain also contributing to her depressed mood.  Tennile states she is "disgusted" with the way she looks.  LCSW assisted patient to process thoughts, feelings, concerns.  Addressed coping strategies.  Patient states her friend, Janett Billow, she reconnected with continues to call her every day which is helpful.  Patient reliant on her faith and has good support from her son.  Patient does state that her son is currently ill and they may not be able to go to Thanksgiving at her sisters as planned.  LCSW facilitated reminiscence related to Thanksgiving traditions with her family who may be willing to delay in order to include Chenae and her son. LCSW reviewed poc including scheduling prior to close of session. Pt states appreciation for care.   Suicidal/Homicidal: Nowithout intent/plan  Therapist Response: Pt remains receptive to care.  Plan: Return again in ~3 weeks.  Diagnosis: Axis I:  MDD, severe  Jamesetta Geralds  Ileene Patrick, Marlinda Mike 04/01/2021

## 2021-04-03 ENCOUNTER — Telehealth: Payer: Self-pay | Admitting: *Deleted

## 2021-04-03 ENCOUNTER — Ambulatory Visit (HOSPITAL_BASED_OUTPATIENT_CLINIC_OR_DEPARTMENT_OTHER): Payer: No Typology Code available for payment source | Admitting: Physical Therapy

## 2021-04-03 ENCOUNTER — Other Ambulatory Visit: Payer: Self-pay

## 2021-04-03 ENCOUNTER — Encounter (HOSPITAL_BASED_OUTPATIENT_CLINIC_OR_DEPARTMENT_OTHER): Payer: Self-pay | Admitting: Physical Therapy

## 2021-04-03 ENCOUNTER — Encounter: Payer: Self-pay | Admitting: *Deleted

## 2021-04-03 DIAGNOSIS — M545 Low back pain, unspecified: Secondary | ICD-10-CM

## 2021-04-03 DIAGNOSIS — M6281 Muscle weakness (generalized): Secondary | ICD-10-CM

## 2021-04-03 DIAGNOSIS — G8929 Other chronic pain: Secondary | ICD-10-CM

## 2021-04-03 DIAGNOSIS — R296 Repeated falls: Secondary | ICD-10-CM

## 2021-04-03 DIAGNOSIS — R262 Difficulty in walking, not elsewhere classified: Secondary | ICD-10-CM

## 2021-04-03 DIAGNOSIS — R278 Other lack of coordination: Secondary | ICD-10-CM

## 2021-04-03 NOTE — Therapy (Signed)
[facility] [address] [place], [place], [address] Phone: [phone]   Fax:  [phone]  Physical Therapy Treatment/Progress Notee     Patient Details  Name: Karen Whitehead MRN: [id number] Date of Birth: Jan 31, 1966 Referring Provider (PT): Dr. Charlott Rakes   Encounter Date: 04/03/2021   PT End of Session - 04/03/21 1634     Visit Number 11    Number of Visits 22    Date for PT Re-Evaluation 04/22/21    Progress Note Due on Visit --   04/10/21   PT Start Time 0350    PT Stop Time 0430    PT Time Calculation (min) 40 min    Equipment Utilized During Treatment Other (comment)    Activity Tolerance --   Pt required assistance with mobility and has limited tolerance.   Behavior During Therapy WFL for tasks assessed/performed             Past Medical History:  Diagnosis Date   Granuloma annulare     Past Surgical History:  Procedure Laterality Date   APPENDECTOMY      There were no vitals filed for this visit.   Subjective Assessment - 04/03/21 1635     Subjective Pt reports fall ~ 2weeks ago with right knee pain.  No fx as per ER.                                            PT Short Term Goals - 03/11/21 2103       PT SHORT TERM GOAL #1   Title Pt will be able to perform sit to stand transfers safely and independently.    Baseline requires assistance    Time 6    Period Weeks    Status Not Met    Target Date 04/22/21      PT SHORT TERM GOAL #2   Title Pt will report at least a 25% improvement overall in pain and mobility.    Time 4    Period Weeks    Status Not Met    Target Date 04/08/21      PT SHORT TERM GOAL #3   Title Pt will ambulate at least 100 ft with improved gait speed and correct usage of walker.    Time 4    Period Weeks    Status Not Met    Target Date 04/08/21      PT SHORT TERM GOAL #5   Title Pt will demo improved LE strength to 4- to  4/5 MMT in hip flexion, knee flex and extension, and DF bilat for improved mobility, ambulation, and tolerance to activity.    Baseline Not assessed    Time 6    Period Weeks    Status Deferred    Target Date 04/22/21      PT SHORT TERM GOAL #6   Title Pt will report improved standing duration at home to improve performance of self care activities and light IADLs.    Time 4    Period Weeks    Status Not Met    Target Date 04/08/21               PT Long Term Goals - 03/11/21 2105       PT LONG TERM GOAL #1   Title Pt will ambulate at least 200 ft with  walker with no > than SBA/CGA without having to sit down    Time 6    Period Weeks    Status On-going    Target Date 04/22/21      PT LONG TERM GOAL #2   Title Pt will tolerate aquatic therapy without significant increased pain in order to establish an exercise routine to improve functional mobility and tolerance to activity.    Time 6    Period Weeks    Status On-going    Target Date 04/22/21      PT LONG TERM GOAL #3   Title Pt will be able to perform TUG in no > than 20 sec for improved mobility and gait speed.    Time 6    Period Weeks    Status Not Met    Target Date 04/22/21      PT LONG TERM GOAL #4   Title Pt will be able to ambulate community distance with walker without significant pain or difficulty.    Time 6    Period Weeks    Status Not Met    Target Date 04/22/21      PT LONG TERM GOAL #5   Title Pt will report she is able to perform bed mobility, self care activities, and some light IADLs in her home without signifiant back and LE pain.    Time 6    Period Weeks    Status Not Met    Target Date 04/22/21      PT LONG TERM GOAL #6   Title Pt will be able to perform aquatic program with appropriate assistance without increased pain for improved function, mobility, balance, strength, and activity tolerance.    Time 6    Period Weeks    Status Not Met    Target Date 04/22/21      PT LONG TERM  GOAL #7   Title Pt will report she is able to perform stairs at home safely without assistance to enter/exit home.    Time 6    Period Weeks    Status Not Met    Target Date 04/22/21      PT LONG TERM GOAL #8   Time 6    Period Weeks    Status Not Met    Target Date 04/22/21             Pt seen for aquatic therapy today.  Treatment took place in water 3.25-4.8 ft in depth at the [facility] pool. Temp of water was 93.  Pt entered/exited the pool via lift.  Ambulation with yellow noodle forward, back and sidestepping Gait training to increase right knee flex with swing, strike heel, toe off Marching 2x10 TR/HR 2 x10 Seated: patellar mobs and massage to hamstring tendon, hamstring and gastroc -knee flex/ext R/L -STS from water bench  Pt requires buoyancy for support and to offload joints with strengthening exercises. Viscosity of the water is needed for resistance of strengthening; water current perturbations provides challenge to standing balance unsupported, requiring increased core activation.       Plan - 04/03/21 1637     Clinical Impression Statement Fall at home with right knee pain.  ER-no fx.  HAs not pursued any further treatment althoug right knee pain continues futher limiting function.  Most recent injections in spine with no long term relief in lumbar pain. Initial entrance in water pt has difficulty engaging/putting feet on floor due to pain, wants to float. Tolerates session fair.  Right knee ROM improved to ~ 90 flex - -10 extension.  Hamstring tnedon tight and  tender with palpation.  Massage to area along with genlte movement in warm water eases pain some. Pt reports she has not been walking in home, using wc.  Pt encouraged to see ortho for knee injury    Stability/Clinical Decision Making Evolving/Moderate complexity    Clinical Decision Making Moderate    Rehab Potential Fair    PT Frequency 2x / week    PT Duration 6 weeks    PT  Treatment/Interventions ADLs/Self Care Home Management;Aquatic Therapy;Cryotherapy;Electrical Stimulation;Ultrasound;Moist Heat;Gait training;Stair training;Functional mobility training;Therapeutic activities;Therapeutic exercise;Balance training;Neuromuscular re-education;Manual techniques;Patient/family education    PT Next Visit Plan R knee?? Ortho appointment             Patient will benefit from skilled therapeutic intervention in order to improve the following deficits and impairments:  Abnormal gait, Decreased activity tolerance, Decreased balance, Decreased mobility, Decreased endurance, Decreased range of motion, Decreased safety awareness, Decreased strength, Difficulty walking, Pain  Visit Diagnosis: Chronic low back pain, unspecified back pain laterality, unspecified whether sciatica present  Muscle weakness (generalized)  Difficulty in walking, not elsewhere classified  Chronic pain of both knees  Other lack of coordination  Repeated falls     Problem List Patient Active Problem List   Diagnosis Date Noted   LGSIL on Pap smear of cervix 03/14/2021   Vitamin D deficiency 02/20/2021   Weakness 12/21/2020   Complaints of total body pain 12/21/2020   Chronic migraine w/o aura w/o status migrainosus, not intractable 12/21/2020   Bilateral low back pain with bilateral sciatica 12/21/2020   Major depressive disorder, recurrent episode with anxious distress ([place]) 03/14/2020   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

## 2021-04-03 NOTE — Telephone Encounter (Signed)
Mrs Czerwinski called and she is still having pain and poor mobility with her left leg. She is asking for advice. She called and is asking for more help. (Dr Letta Pate addressed last call and she is to follow up with [facility].)  I tried to call her back to tell her we have an opening on 12/9 with [facility] but she is not answering her phone and no VM. I will send her a my chart message letting her know we have scheduled her for that appt @ 11:40.

## 2021-04-05 ENCOUNTER — Other Ambulatory Visit (HOSPITAL_COMMUNITY): Payer: Self-pay | Admitting: Psychiatry

## 2021-04-05 DIAGNOSIS — F339 Major depressive disorder, recurrent, unspecified: Secondary | ICD-10-CM

## 2021-04-08 ENCOUNTER — Telehealth: Payer: Self-pay | Admitting: Family Medicine

## 2021-04-08 ENCOUNTER — Ambulatory Visit: Payer: Self-pay

## 2021-04-08 DIAGNOSIS — M25569 Pain in unspecified knee: Secondary | ICD-10-CM

## 2021-04-08 NOTE — Telephone Encounter (Signed)
Routing to PCP for review.

## 2021-04-08 NOTE — Telephone Encounter (Signed)
Copied from [facility] [id number]. Topic: Referral - Request for Referral >> Apr 08, 2021  1:25 PM Leward Quan A wrote: Has patient seen PCP for this complaint? No. *If NO, is insurance requiring patient see PCP for this issue before PCP can refer them? Referral for which specialty: Orthopedic Preferred provider/office: [facility] [address]  Reason for referral: fell and hurt knee

## 2021-04-08 NOTE — Telephone Encounter (Signed)
Pt called in stating that her R knee is hurting and swollen from a fall 3 weeks ago. She is wanting a referral to Ortho to having imagining done on her knee. She states that she fell and hit her knee and went to the ER but they didn't do a MRI or anything. She states that it hurts behind the knee like she pulled a muscle or tendon. She reports that she is unable to bear full weight and if she moves the knee a certain way pain will radiate down the leg into the L leg as well. Pt takes aquatic therapy and her therapist there told her she needed to go see an Ortho provider. Pt says that she was told to take 2 Tylenol and 1 Ibuprofen together to help with the pain. She says it does help but she doesn't want to continue taking ibuprofen because its bad for your kidneys. Attempted to schedule appt for pt in office but first available was 04/25/21. Advised pt she can go to [facility] to be seen. She states that she has [employer] and maybe [facility] transportation will give her a ride there or she would see if a family member can take her. Pt is asking specifically about MRI. I informed her that if provider she sees at [facility] feels that's appropriate they can put in order for her. Pt was given the number to the UC at [facility] and Care advice given and pt verbalized understanding. No other questions/concerns noted.    Reason for Disposition  [1] MODERATE pain (e.g., interferes with normal activities, limping) AND [2] present > 3 days  Answer Assessment - Initial Assessment Questions 1. LOCATION and RADIATION: "Where is the pain located?"      R knee behind the knee 2. QUALITY: "What does the pain feel like?"  (e.g., sharp, dull, aching, burning)     Pulling pain, sharp pain, shoots down leg and into L leg 3. SEVERITY: "How bad is the pain?" "What does it keep you from doing?"   (Scale 1-10; or mild, moderate, severe)   -  MILD (1-3): doesn't interfere with normal activities    -  MODERATE (4-7): interferes with normal activities  (e.g., work or school) or awakens from sleep, limping    -  SEVERE (8-10): excruciating pain, unable to do any normal activities, unable to walk     Moderate, unable to bear full weight 4. ONSET: "When did the pain start?" "Does it come and go, or is it there all the time?"     3 weeks ago when fell 5. RECURRENT: "Have you had this pain before?" If Yes, ask: "When, and what happened then?"     No 6. SETTING: "Has there been any recent work, exercise or other activity that involved that part of the body?"      No 7. AGGRAVATING FACTORS: "What makes the knee pain worse?" (e.g., walking, climbing stairs, running)     Walking and movements 8. ASSOCIATED SYMPTOMS: "Is there any swelling or redness of the knee?"     swelling 9. OTHER SYMPTOMS: "Do you have any other symptoms?" (e.g., chest pain, difficulty breathing, fever, calf pain)     NO 10. PREGNANCY: "Is there any chance you are pregnant?" "When was your last menstrual period?"       NO  Protocols used: Knee Pain-A-AH

## 2021-04-08 NOTE — Telephone Encounter (Signed)
Copied from [place] [id number]. Topic: Referral - Request for Referral >> Apr 08, 2021  2:18 PM Loma [place] wrote: Copied from [facility]  Has patient seen PCP for this complaint? No. *If NO, is insurance requiring patient see PCP for this issue before PCP can refer them? Referral for which specialty: Orthopedic Preferred provider/office: [facility] [address]  Reason for referral: fell and hurt knee  Pt has called again and states UC will not do MRI's states that her P therapy  said this morning that there is something she feels in back of knee and needs an MRI. She states that will be almost 3 wks before can be seen at [facility] for MRI Please call pt as in a lot of pain. Already triaged and called  UC was advised does not do MRI and. 3  FU as she seems to think that she needs to see PCP [phone] Pt says that her dr needs to get involved pls fu as soon as possible

## 2021-04-08 NOTE — Telephone Encounter (Signed)
See nurse triage

## 2021-04-09 ENCOUNTER — Encounter (HOSPITAL_BASED_OUTPATIENT_CLINIC_OR_DEPARTMENT_OTHER): Payer: Self-pay | Admitting: Physical Therapy

## 2021-04-09 ENCOUNTER — Ambulatory Visit (HOSPITAL_BASED_OUTPATIENT_CLINIC_OR_DEPARTMENT_OTHER): Payer: No Typology Code available for payment source | Attending: Family Medicine | Admitting: Physical Therapy

## 2021-04-09 ENCOUNTER — Other Ambulatory Visit: Payer: Self-pay

## 2021-04-09 DIAGNOSIS — R262 Difficulty in walking, not elsewhere classified: Secondary | ICD-10-CM | POA: Insufficient documentation

## 2021-04-09 DIAGNOSIS — G8929 Other chronic pain: Secondary | ICD-10-CM | POA: Insufficient documentation

## 2021-04-09 DIAGNOSIS — M6281 Muscle weakness (generalized): Secondary | ICD-10-CM | POA: Insufficient documentation

## 2021-04-09 DIAGNOSIS — R296 Repeated falls: Secondary | ICD-10-CM | POA: Insufficient documentation

## 2021-04-09 DIAGNOSIS — R278 Other lack of coordination: Secondary | ICD-10-CM | POA: Insufficient documentation

## 2021-04-09 DIAGNOSIS — M545 Low back pain, unspecified: Secondary | ICD-10-CM | POA: Insufficient documentation

## 2021-04-09 DIAGNOSIS — M25562 Pain in left knee: Secondary | ICD-10-CM | POA: Insufficient documentation

## 2021-04-09 DIAGNOSIS — M25561 Pain in right knee: Secondary | ICD-10-CM | POA: Insufficient documentation

## 2021-04-09 NOTE — Therapy (Signed)
[place], [place], [address] Phone: [phone]   Fax:  [phone]  Physical Therapy Treatment  Patient Details  Name: Karen Whitehead MRN: [id number] Date of Birth: [birth year] Referring Provider (PT): Dr. Charlott Rakes   Encounter Date: 04/09/2021   PT End of Session - 04/09/21 1629     Visit Number 12    Number of Visits 22    Date for PT Re-Evaluation 04/22/21    PT Start Time 1525    PT Stop Time 1604    PT Time Calculation (min) 39 min    Equipment Utilized During Treatment Other (comment)   noodle   Activity Tolerance --   Pt required assistance with mobility and has limited tolerance   Behavior During Therapy [facility] for tasks assessed/performed             Past Medical History:  Diagnosis Date   Granuloma annulare     Past Surgical History:  Procedure Laterality Date   APPENDECTOMY      There were no vitals filed for this visit.   Subjective Assessment - 04/09/21 1628     Subjective Pt states her knee is still bothering her.  She reports no fracture when she went to the ER weeks ago.  Pt reports 10/10 R posterior knee pain and 8/10 lumbar pain.  Pt states the back pain has returned since having the injections.   Pertinent History Fall Risk.  Granuloma Annulare, major depressive disoder, legionnairre's disease, dengue fever, DM, memory loss, anxiety.    Currently in Pain? Yes   see above             Pt seen for aquatic therapy today.  Treatment took place in water 3.25-4.8 ft in depth at the [facility] pool. Temp of water was 93.  Pt entered/exited the pool via aquatic chair lift with assistance.   Ambulation with yellow noodle forward with verbal and visual cuing to stand up straight, LE advancement, and gait.  PT provided assistance multiple times for LOB PT had Pt work on standing up straight while holding on noodle for multiple repetitions for short bouts with  cuing for correct posture Seated Marching 2x10 Seated LAQ 2x10 on L seated heel raises and toe raises 2x10 reps Seated hip abd/add approx 10 reps STS from water bench approx 4 reps with assistance from PT.  Pt stopped due to fatigue and difficulty.    Pt requires buoyancy for support and to offload joints with strengthening exercises. Viscosity of the water is needed for resistance of strengthening; water current perturbations provides challenge to standing balance unsupported, requiring increased core activation                       PT Education - 04/09/21 2229     Education Details exercise form, gait, standing posture    Person(s) Educated Patient    Methods Explanation;Demonstration;Verbal cues    Comprehension Verbal cues required;Returned demonstration;Verbalized understanding              PT Short Term Goals - 03/11/21 2103       PT SHORT TERM GOAL #1   Title Pt will be able to perform sit to stand transfers safely and independently.    Baseline requires assistance    Time 6    Period Weeks    Status Not Met    Target Date 04/22/21      PT SHORT TERM GOAL #  2   Title Pt will report at least a 25% improvement overall in pain and mobility.    Time 4    Period Weeks    Status Not Met    Target Date 04/08/21      PT SHORT TERM GOAL #3   Title Pt will ambulate at least 100 ft with improved gait speed and correct usage of walker.    Time 4    Period Weeks    Status Not Met    Target Date 04/08/21      PT SHORT TERM GOAL #5   Title Pt will demo improved LE strength to 4- to 4/5 MMT in hip flexion, knee flex and extension, and DF bilat for improved mobility, ambulation, and tolerance to activity.    Baseline Not assessed    Time 6    Period Weeks    Status Deferred    Target Date 04/22/21      PT SHORT TERM GOAL #6   Title Pt will report improved standing duration at home to improve performance of self care activities and light IADLs.     Time 4    Period Weeks    Status Not Met    Target Date 04/08/21               PT Long Term Goals - 03/11/21 2105       PT LONG TERM GOAL #1   Title Pt will ambulate at least 200 ft with walker with no > than SBA/CGA without having to sit down    Time 6    Period Weeks    Status On-going    Target Date 04/22/21      PT LONG TERM GOAL #2   Title Pt will tolerate aquatic therapy without significant increased pain in order to establish an exercise routine to improve functional mobility and tolerance to activity.    Time 6    Period Weeks    Status On-going    Target Date 04/22/21      PT LONG TERM GOAL #3   Title Pt will be able to perform TUG in no > than 20 sec for improved mobility and gait speed.    Time 6    Period Weeks    Status Not Met    Target Date 04/22/21      PT LONG TERM GOAL #4   Title Pt will be able to ambulate community distance with walker without significant pain or difficulty.    Time 6    Period Weeks    Status Not Met    Target Date 04/22/21      PT LONG TERM GOAL #5   Title Pt will report she is able to perform bed mobility, self care activities, and some light IADLs in her home without signifiant back and LE pain.    Time 6    Period Weeks    Status Not Met    Target Date 04/22/21      PT LONG TERM GOAL #6   Title Pt will be able to perform aquatic program with appropriate assistance without increased pain for improved function, mobility, balance, strength, and activity tolerance.    Time 6    Period Weeks    Status Not Met    Target Date 04/22/21      PT LONG TERM GOAL #7   Title Pt will report she is able to perform stairs at home safely without assistance to enter/exit home.  Time 6    Period Weeks    Status Not Met    Target Date 04/22/21      PT LONG TERM GOAL #8   Time 6    Period Weeks    Status Not Met    Target Date 04/22/21                   Plan - 04/09/21 2219     Clinical Impression Statement Pt  continues to have high levels of lumbar pain and reports her pain has returned since having the injections.  She has a high level of R knee pain now since her most recent fall.  Pt continues to be limited with activities/exercises in the pool due to pain, tolerance, and weakness.  PT had pt perform a lot of seated exercises due to limitations with standing.  She is very slow with mobility in the water and is limited with LE advancement with gait.  Pt has decreased step length bilat.  Pt has much difficulty with standing up straight.  She wants to float and is forward flexed with ambulation and standing.  Pt unable to demo smooth reciprocal gait in the water and required assistance from PT multiple times for LOB.  PT did provide assistance with transfer from W/C to aquatic chair lift though pt was able to do a lot of the work.  Pt was fatigued after Rx.    Comorbidities Fall Risk. Granuloma Annulare, major depressive disorder. Hx of legionnairre's disease and dengue fever, DM    PT Treatment/Interventions ADLs/Self Care Home Management;Aquatic Therapy;Cryotherapy;Electrical Stimulation;Ultrasound;Moist Heat;Gait training;Stair training;Functional mobility training;Therapeutic activities;Therapeutic exercise;Balance training;Neuromuscular re-education;Manual techniques;Patient/family education    PT Next Visit Plan Cont with aquatic therapy.  Work on standing tolerance and gait in the pool per tolerance.    Consulted and Agree with Plan of Care Patient             Patient will benefit from skilled therapeutic intervention in order to improve the following deficits and impairments:  Abnormal gait, Decreased activity tolerance, Decreased balance, Decreased mobility, Decreased endurance, Decreased range of motion, Decreased safety awareness, Decreased strength, Difficulty walking, Pain  Visit Diagnosis: Chronic low back pain, unspecified back pain laterality, unspecified whether sciatica present  Muscle  weakness (generalized)  Difficulty in walking, not elsewhere classified  Chronic pain of both knees  Other lack of coordination  Repeated falls     Problem List Patient Active Problem List   Diagnosis Date Noted   LGSIL on Pap smear of cervix 03/14/2021   Vitamin D deficiency 02/20/2021   Weakness 12/21/2020   Complaints of total body pain 12/21/2020   Chronic migraine w/o aura w/o status migrainosus, not intractable 12/21/2020   Bilateral low back pain with bilateral sciatica 12/21/2020   Major depressive disorder, recurrent episode with anxious distress ([place]) 03/14/2020   Granuloma annulare 11/03/2019   History of dengue 11/03/2019   History of Legionnaire's disease 11/03/2019    Selinda Michaels III PT, DPT 04/09/21 10:37 PM   [facility] [address] [place], [place], [address] Phone: [phone]   Fax:  [phone]  Name: CHANNEL PAPANDREA MRN: [id number] Date of Birth: 1966-01-30

## 2021-04-09 NOTE — Progress Notes (Addendum)
Subjective:    Patient ID: Karen Whitehead, female    DOB: 1965/11/17, 55 y.o.   MRN: [id number]  HPI   Karen Whitehead is a 55 year old woman who presents for follow-up of myalgias post-dengue fever.  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  -she gets lots of headaches -she has never tried topamax and amitriptyline  -reviewed her hip and knee XRs with her: negative -she would like to get XRs of her spine as well.  -her hands and muscles throb so baldy that she cannot fall asleep at night -she takes one muscle relaxer in the morning -she used to be very active and takes girls trips to the beach.    2) Insomnia: -she currently takes trazodone and sertraline.  -she has not found relief with the amitriptyline 10mg .  -her pain keeps her awake  3) memory loss -has not worked with SLP before -does not follow with neurology -discussed results of her MRI brain with her- shows degeneration atypical for her age but no acute processes.   4) Rash- has to wear everything shortsleeve  5) Lumbar DDD: -she cannot handle hte pain -muscle relaxers don't help -she loves aquatic therapy- the take their time and are trying to build her muscles -she has an EMG/NCS scheduled -she wants a medicine to help her get through the pain today.  -she has tried ibuprofen and was was told not to because of her kidneys -had ESI and could not walk after -still has pain in her lower back where her pain is the worst -pain does not radiate into the legs/ -the trigger point injections helped for 12-13 days -her son heats rags and lays these on her back  6) Fall: -she fell sideways on her elbow when she could not judge the distance appropriately. -she fell on her knee recently.   -she is going to be seeing an orthopedic doctor  7) Vision goes in and out -sometimes she can see the TV and sometimes she can't   Diet: her son usually cooks her meals for her.    Pain Inventory Average Pain 9 Pain Right Now 9 My pain is constant, sharp, stabbing, tingling and aching  In the last 24 hours, has pain interfered with the following? General activity 10 Relation with others 10 Enjoyment of life 10 What TIME of day is your pain at its worst? morning , daytime, evening and night Sleep (in general) Poor  Pain is worse with: walking, bending, inactivity and standing Pain improves with: medication, injections Relief from Meds: 3  use a walker how many minutes can you walk? 5 ability to climb steps?  yes do you drive?  no  not employed: date last employed . I need assistance with the following:  dressing, bathing, household duties and shopping  bladder control problems weakness numbness tingling trouble walking spasms dizziness confusion depression anxiety suicidal thoughts        Family History  Problem Relation Age of Onset   Stomach cancer Mother    Heart attack Father    Breast cancer Neg Hx    Social History   Socioeconomic History   Marital status: Single    Spouse name: Not on file   Number of  children: 1   Years of education: Not on file   Highest education level: Some college, no degree  Occupational History   Not on file  Tobacco Use   Smoking status: Every Day    Packs/day: 0.50    Types: Cigarettes   Smokeless tobacco: Never  Vaping Use   Vaping Use: Never used  Substance and Sexual Activity   Alcohol use: No   Drug use: No   Sexual activity: Not Currently  Other Topics Concern   Not on file  Social History Narrative   Not on file   Social Determinants of Health   Financial Resource Strain: Not on file  Food Insecurity: Food Insecurity Present   Worried About [place] in the Last Year: Often true    Ran Out of Food in the Last Year: Sometimes true  Transportation Needs: No Transportation Needs   Lack of Transportation (Medical): No   Lack of Transportation (Non-Medical): No  Physical Activity: Not on file  Stress: Not on file  Social Connections: Not on file   Past Surgical History:  Procedure Laterality Date   APPENDECTOMY     Past Medical History:  Diagnosis Date   Granuloma annulare    BP 136/87   Pulse 82   Temp 98.2 F (36.8 C) (Oral)   Ht 5\' 4"  (1.626 m)   Wt 232 lb (105.2 kg)   SpO2 93%   BMI 39.82 kg/m   Opioid Risk Score:   Fall Risk Score:  `1  Depression screen PHQ 2/9  Depression screen [facility] 2/9 03/14/2021 03/08/2021 12/17/2020 10/31/2020 09/20/2020 02/06/2020 11/16/2019  Decreased Interest 3 1 3 3 3 3 3   Down, Depressed, Hopeless 3 1 3 3 3 3 3   PHQ - 2 Score 6 2 6 6 6 6 6   Altered sleeping 3 - 3 3 3 3 3   Tired, decreased energy 3 - 3 3 3 3 3   Change in appetite 3 - 3 3 3 3 3   Feeling bad or failure about yourself  3 - 3 3 3 3 3   Trouble concentrating 3 - 3 3 - 3 3  Moving slowly or fidgety/restless 3 - 3 3 2 1  0  Suicidal thoughts 2 - 3 2 1 2 1   PHQ-9 Score 26 - 27 26 21 24 22   Difficult doing work/chores - - - - Extremely dIfficult - -  Some encounter information is confidential and restricted. Go to Review Flowsheets activity to see all data.    Review of Systems  Constitutional:  Positive for appetite change.  Respiratory:  Positive for cough and shortness of breath.   Gastrointestinal:  Positive for constipation.  Musculoskeletal:  Positive for back pain, gait problem and neck pain.       Left hip and knee pain Pain in back of legs  Neurological:  Positive for dizziness, weakness and numbness.       Tongling  Psychiatric/Behavioral:  Positive for confusion, dysphoric mood and suicidal ideas. The patient is nervous/anxious.   All other systems reviewed and are negative.     Objective:   Physical Exam Gen: no distress, normal  appearing HEENT: oral mucosa pink and moist, NCAT Cardio: Reg rate Chest: normal effort, normal rate of breathing Abd: soft, non-distended Ext: no edema Psych: pleasant, normal affect Skin: intact Neuro: slow speech, slow processing Musculoskeletal: extremely decreased strength 3/5 throughout. Tenderness to palpation over right paraspinals. Antalgic gait with RW. Unable to bend due to weakness.  Assessment & Plan:  1)Chronic pain syndrome and fatigue secondary to myalgias and arthralgias following Dengue fever -Discussed current symptoms of pain and history of pain.  -Discussed benefits of exercise in reducing pain. -discussed that XRs of hip and knees are normal -ordered XRs of spine as well.  -continue water aerobics -cannot increase meloxicam further given risk of GI bleeding.  -cannot use cymbalta due to risk of bleed.  -discussed risks and of tylenol and NSAIDs.  -prescribed lidocaine patches.  -discussed spinal cord stimulator -Discussed Qutenza as an option for neuropathic pain control. Discussed that this is a capsaicin patch, stronger than capsaicin cream. Discussed that it is currently approved for diabetic peripheral neuropathy and post-herpetic neuralgia, but that it has also shown benefit in treating other forms of neuropathy. Provided patient with link to site to learn more about the patch: CinemaBonus.fr. Discussed that the patch would be placed in office and benefits usually last 3 months. Discussed that unintended exposure to capsaicin can cause severe irritation of eyes, mucous membranes, respiratory tract, and skin, but that Qutenza is a local treatment and does not have the systemic side effects of other nerve medications. Discussed that there may be pain, itching, erythema, and decreased sensory function associated with the application of Qutenza. Side effects usually subside within 1 week. A cold pack of analgesic medications can help with these side effects.  Blood pressure can also be increased due to pain associated with administration of the patch.  -tylenol does not help -referred to [facility] to discuss spinal cord stimulator -Provided with a pain relief journal and discussed that it contains foods and lifestyle tips to naturally help to improve pain. Discussed that these lifestyle strategies are also very good for health unlike some medications which can have negative side effects. Discussed that the act of keeping a journal can be therapeutic and helpful to realize patterns what helps to trigger and alleviate pain.   -lidocaine patches do help -increase gabapentin to 300mg  TID -Discussed current symptoms of pain and history of pain.  -Discussed benefits of exercise in reducing pain. -Discussed following foods that may reduce pain: 1) Ginger (especially studied for arthritis)- reduce leukotriene production to decrease inflammation 2) Blueberries- high in phytonutrients that decrease inflammation 3) Salmon- marine omega-3s reduce joint swelling and pain 4) Pumpkin seeds- reduce inflammation 5) dark chocolate- reduces inflammation 6) turmeric- reduces inflammation 7) tart cherries - reduce pain and stiffness 8) extra virgin olive oil - its compound olecanthal helps to block prostaglandins  9) chili peppers- can be eaten or applied topically via capsaicin 10) mint- helpful for headache, muscle aches, joint pain, and itching 11) garlic- reduces inflammation  Link to further information on diet for chronic pain: http://www.randall.com/   2) Insomnia:  -Amitripyline discontinued by another doctor.  -seroquel produced hallucinations.   3) Left sided weakness/memory loss -MRI brain ordered to assess for possible contributory neurological process: reviewed results with her: shows degeneration atypical for her age without acute process -referred to  neurology -restart 1TB coconut oil daily, discussed may feel nausea at first, goal to increase to 4 TB over time as tolerated  4) Anxiety: discussed that her clonopin has to be filled by her psychiatry or internal medicine.   5) Myofascial pain: will schedule for trigger point injections.  -continue muscle relaxers.   6) Lumbar disc degeneration with facet hypertrophy -prescribed topamax 25mg  BID -lidocaine patches seem to help -continue Aqua Therapy -Reviewed MRI of lumbar spine with her which does  mild facet hypertrophy at multiple levels -Medial branch block performed for right sided pain at L3-L4 and L4-L5 levels. Patient has never medial branch blocks before. Still has pain from this and was unable to walk after  7) Visual deficits:  -encouraged follow-up with optometrist.  -advised that this could contribute to headaches.   8) Impaired mobility and ADLs -placed order for wheelchair

## 2021-04-09 NOTE — Telephone Encounter (Signed)
I have placed referral to Orthopedic who will determine what imaging is needed. MRI is typically not ordered right as initial imaging right after a fall

## 2021-04-10 NOTE — Telephone Encounter (Signed)
Pt was called and informed of referral being placed. ?

## 2021-04-11 ENCOUNTER — Other Ambulatory Visit: Payer: Self-pay

## 2021-04-11 MED FILL — Glipizide Tab 5 MG: ORAL | 30 days supply | Qty: 30 | Fill #2 | Status: AC

## 2021-04-12 ENCOUNTER — Other Ambulatory Visit: Payer: Self-pay

## 2021-04-12 ENCOUNTER — Encounter
Payer: No Typology Code available for payment source | Attending: Physical Medicine and Rehabilitation | Admitting: Physical Medicine and Rehabilitation

## 2021-04-12 VITALS — BP 136/87 | HR 82 | Temp 98.2°F | Ht 64.0 in | Wt 232.0 lb

## 2021-04-12 DIAGNOSIS — H538 Other visual disturbances: Secondary | ICD-10-CM | POA: Insufficient documentation

## 2021-04-12 DIAGNOSIS — G894 Chronic pain syndrome: Secondary | ICD-10-CM

## 2021-04-12 DIAGNOSIS — W19XXXS Unspecified fall, sequela: Secondary | ICD-10-CM

## 2021-04-12 DIAGNOSIS — F17210 Nicotine dependence, cigarettes, uncomplicated: Secondary | ICD-10-CM | POA: Insufficient documentation

## 2021-04-12 DIAGNOSIS — M791 Myalgia, unspecified site: Secondary | ICD-10-CM | POA: Insufficient documentation

## 2021-04-12 DIAGNOSIS — M51369 Other intervertebral disc degeneration, lumbar region without mention of lumbar back pain or lower extremity pain: Secondary | ICD-10-CM

## 2021-04-12 DIAGNOSIS — Z5941 Food insecurity: Secondary | ICD-10-CM | POA: Insufficient documentation

## 2021-04-12 DIAGNOSIS — R413 Other amnesia: Secondary | ICD-10-CM | POA: Insufficient documentation

## 2021-04-12 DIAGNOSIS — R21 Rash and other nonspecific skin eruption: Secondary | ICD-10-CM | POA: Insufficient documentation

## 2021-04-12 DIAGNOSIS — F419 Anxiety disorder, unspecified: Secondary | ICD-10-CM | POA: Insufficient documentation

## 2021-04-12 DIAGNOSIS — M5136 Other intervertebral disc degeneration, lumbar region: Secondary | ICD-10-CM | POA: Insufficient documentation

## 2021-04-12 DIAGNOSIS — M47819 Spondylosis without myelopathy or radiculopathy, site unspecified: Secondary | ICD-10-CM | POA: Insufficient documentation

## 2021-04-12 DIAGNOSIS — B948 Sequelae of other specified infectious and parasitic diseases: Secondary | ICD-10-CM | POA: Insufficient documentation

## 2021-04-12 DIAGNOSIS — R519 Headache, unspecified: Secondary | ICD-10-CM | POA: Insufficient documentation

## 2021-04-12 DIAGNOSIS — G4700 Insomnia, unspecified: Secondary | ICD-10-CM | POA: Insufficient documentation

## 2021-04-12 DIAGNOSIS — R5383 Other fatigue: Secondary | ICD-10-CM | POA: Insufficient documentation

## 2021-04-12 DIAGNOSIS — R262 Difficulty in walking, not elsewhere classified: Secondary | ICD-10-CM | POA: Insufficient documentation

## 2021-04-12 DIAGNOSIS — Z7409 Other reduced mobility: Secondary | ICD-10-CM

## 2021-04-12 MED ORDER — VITAMIN D (ERGOCALCIFEROL) 1.25 MG (50000 UNIT) PO CAPS
50000.0000 [IU] | ORAL_CAPSULE | ORAL | 0 refills | Status: AC
  Filled 2021-04-12: qty 4, 28d supply, fill #0

## 2021-04-12 MED ORDER — NAC 600 MG PO CAPS
1.0000 | ORAL_CAPSULE | Freq: Two times a day (BID) | ORAL | 0 refills | Status: DC
  Filled 2021-04-12: qty 60, 30d supply, fill #0

## 2021-04-12 MED ORDER — GABAPENTIN 300 MG PO CAPS
300.0000 mg | ORAL_CAPSULE | Freq: Three times a day (TID) | ORAL | 6 refills | Status: AC
  Filled 2021-04-12 – 2021-05-17 (×3): qty 90, 30d supply, fill #0
  Filled 2021-06-25: qty 90, 30d supply, fill #1

## 2021-04-12 MED ORDER — LIDOCAINE 5 % EX PTCH
2.0000 | MEDICATED_PATCH | CUTANEOUS | 11 refills | Status: DC
  Filled 2021-06-25: qty 60, 30d supply, fill #0
  Filled 2021-08-05 – 2021-08-13 (×2): qty 60, 30d supply, fill #1
  Filled 2021-08-14: qty 50, 25d supply, fill #1
  Filled 2022-03-03: qty 30, 15d supply, fill #2

## 2021-04-12 NOTE — Addendum Note (Signed)
Addended by: Izora Ribas on: 04/12/2021 12:08 PM   Modules accepted: Orders

## 2021-04-12 NOTE — Addendum Note (Signed)
Addended by: Izora Ribas on: 04/12/2021 11:58 AM   Modules accepted: Orders

## 2021-04-15 ENCOUNTER — Other Ambulatory Visit: Payer: Self-pay

## 2021-04-15 ENCOUNTER — Ambulatory Visit (INDEPENDENT_AMBULATORY_CARE_PROVIDER_SITE_OTHER): Payer: Self-pay | Admitting: Orthopedic Surgery

## 2021-04-15 DIAGNOSIS — M1711 Unilateral primary osteoarthritis, right knee: Secondary | ICD-10-CM

## 2021-04-16 ENCOUNTER — Encounter (HOSPITAL_BASED_OUTPATIENT_CLINIC_OR_DEPARTMENT_OTHER): Payer: Self-pay | Admitting: Physical Therapy

## 2021-04-16 ENCOUNTER — Ambulatory Visit (HOSPITAL_BASED_OUTPATIENT_CLINIC_OR_DEPARTMENT_OTHER): Payer: No Typology Code available for payment source | Admitting: Physical Therapy

## 2021-04-16 ENCOUNTER — Encounter: Payer: Self-pay | Admitting: Orthopedic Surgery

## 2021-04-16 ENCOUNTER — Encounter (HOSPITAL_COMMUNITY): Payer: Self-pay | Admitting: Psychiatry

## 2021-04-16 ENCOUNTER — Telehealth (INDEPENDENT_AMBULATORY_CARE_PROVIDER_SITE_OTHER): Payer: No Payment, Other | Admitting: Psychiatry

## 2021-04-16 ENCOUNTER — Other Ambulatory Visit: Payer: Self-pay

## 2021-04-16 DIAGNOSIS — F3181 Bipolar II disorder: Secondary | ICD-10-CM | POA: Diagnosis not present

## 2021-04-16 DIAGNOSIS — F411 Generalized anxiety disorder: Secondary | ICD-10-CM | POA: Diagnosis not present

## 2021-04-16 DIAGNOSIS — M25562 Pain in left knee: Secondary | ICD-10-CM

## 2021-04-16 DIAGNOSIS — R278 Other lack of coordination: Secondary | ICD-10-CM

## 2021-04-16 DIAGNOSIS — R262 Difficulty in walking, not elsewhere classified: Secondary | ICD-10-CM

## 2021-04-16 DIAGNOSIS — M6281 Muscle weakness (generalized): Secondary | ICD-10-CM

## 2021-04-16 DIAGNOSIS — G8929 Other chronic pain: Secondary | ICD-10-CM

## 2021-04-16 DIAGNOSIS — M545 Low back pain, unspecified: Secondary | ICD-10-CM

## 2021-04-16 DIAGNOSIS — R296 Repeated falls: Secondary | ICD-10-CM

## 2021-04-16 MED ORDER — CLONAZEPAM 1 MG PO TABS: 0.5000 mg | ORAL_TABLET | Freq: Two times a day (BID) | ORAL | 2 refills | Status: DC | PRN

## 2021-04-16 MED ORDER — SERTRALINE HCL 100 MG PO TABS
ORAL_TABLET | Freq: Every day | ORAL | 3 refills | Status: DC
  Filled 2021-04-16: qty 60, fill #0

## 2021-04-16 MED ORDER — DIVALPROEX SODIUM ER 500 MG PO TB24
500.0000 mg | ORAL_TABLET | Freq: Every evening | ORAL | 6 refills | Status: DC
  Filled 2021-04-16: qty 30, 30d supply, fill #0

## 2021-04-16 MED ORDER — TRAZODONE HCL 100 MG PO TABS
ORAL_TABLET | Freq: Every day | ORAL | 3 refills | Status: DC
  Filled 2021-04-16: qty 60, fill #0

## 2021-04-16 MED ORDER — RISPERIDONE 1 MG PO TABS
1.0000 mg | ORAL_TABLET | Freq: Two times a day (BID) | ORAL | 3 refills | Status: DC
Start: 2021-04-16 — End: 2021-05-21
  Filled 2021-04-16: qty 60, 30d supply, fill #0
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
## 2021-04-16 NOTE — Progress Notes (Signed)
Office Visit Note   Patient: Karen Whitehead           Date of Birth: March 18, 1966           MRN: [id number] Visit Date: 04/15/2021              Requested by: Charlott Rakes, MD [place],  [place] [address] PCP: Charlott Rakes, MD  Chief Complaint  Patient presents with   Right Knee - Pain    S/p fall to ER 03/18/21      HPI: Patient is a 55 year old woman who is seen for initial evaluation for right knee pain.  Patient states she fell several weeks ago and went to the emergency room for the radiographs.  Patient complains of pain in the popliteal fossa with ambulation she currently uses a rolling walker.  Patient has pain with weightbearing and decreased range of motion.  She has used Tylenol and ibuprofen.  She states she has liver and kidney disease and cannot take much medication.  Patient states she has done water exercises in the past.  Assessment & Plan: Visit Diagnoses:  1. Unilateral primary osteoarthritis, right knee     Plan: Recommended Voltaren gel topically recommended continuing the water aerobics.  Follow-Up Instructions: Return if symptoms worsen or fail to improve.   Ortho Exam  Patient is alert, oriented, no adenopathy, well-dressed, normal affect, normal respiratory effort. Examination patient ambulates slowly with an antalgic gait using a rolling walker.  Passively she has range of motion of the right knee from 0 to 100 degrees.  There are no open ulcers no cellulitis no effusion.  Radiographs show some mild medial joint space narrowing there are no osteophytic bone spurs no subcondylar cysts or sclerosis.  Most recent hemoglobin A1c is 6.4.  The medial and lateral joint lines are not tender to palpation she has some tenderness to palpation of the patellofemoral joint.  There are no mechanical symptoms of catching or locking.  Imaging: No results found. No images are attached to the encounter.  Labs: Lab Results  Component Value Date    HGBA1C 6.4 10/31/2020   HGBA1C 5.6 07/31/2020   HGBA1C 6.1 02/06/2020   ESRSEDRATE 30 12/21/2020   ESRSEDRATE 5 02/06/2020   ESRSEDRATE 12 10/16/2019   CRP 13 (H) 12/21/2020   CRP 7 02/06/2020     No results found for: ALBUMIN, PREALBUMIN, CBC  No results found for: MG Lab Results  Component Value Date   VD25OH 17.9 (L) 12/21/2020    No results found for: PREALBUMIN CBC EXTENDED Latest Ref Rng & Units 10/16/2019 10/16/2019 11/10/2018  WBC 4.0 - 10.5 K/uL - 10.5 9.6  RBC 3.87 - 5.11 MIL/uL - 4.82 4.85  HGB 12.0 - 15.0 g/dL 15.6(H) 15.2(H) 15.1(H)  HCT 36.0 - 46.0 % 46.0 44.6 44.0  PLT 150 - 400 K/uL - 289 304  NEUTROABS 1.7 - 7.7 K/uL - - 5.5  LYMPHSABS 0.7 - 4.0 K/uL - - 3.0     There is no height or weight on file to calculate BMI.  Orders:  No orders of the defined types were placed in this encounter.  No orders of the defined types were placed in this encounter.    Procedures: No procedures performed  Clinical Data: No additional findings.  ROS:  All other systems negative, except as noted in the HPI. Review of Systems  Objective: Vital Signs: There were no vitals taken for this visit.  Specialty Comments:  No specialty  comments available.  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   History of dengue 11/03/2019   History of Legionnaire's disease 11/03/2019   Past Medical History:  Diagnosis Date   Granuloma annulare     Family History  Problem Relation Age of Onset   Stomach cancer Mother    Heart attack Father    Breast  cancer Neg Hx     Past Surgical History:  Procedure Laterality Date   APPENDECTOMY     Social History   Occupational History   Not on file  Tobacco Use   Smoking status: Every Day    Packs/day: 0.50    Types: Cigarettes   Smokeless tobacco: Never  Vaping Use   Vaping Use: Never used  Substance and Sexual Activity   Alcohol use: No   Drug use: No   Sexual activity: Not Currently

## 2021-04-16 NOTE — Therapy (Signed)
[facility] [place], [place], [address] Phone: [phone]   Fax:  [phone]  Physical Therapy Treatment  Patient Details  Name: Karen Whitehead MRN: [id number] Date of Birth: 01/07/1966 Referring Provider (PT): Dr. Charlott Rakes   Encounter Date: 04/16/2021   PT End of Session - 04/16/21 1532     Visit Number 13    Number of Visits 22    Date for PT Re-Evaluation 04/22/21    PT Start Time 1527    PT Stop Time 1607    PT Time Calculation (min) 40 min    Equipment Utilized During Treatment Other (comment)   yellow noodle   Activity Tolerance Patient tolerated treatment well    Behavior During Therapy [facility] for tasks assessed/performed             Past Medical History:  Diagnosis Date   Granuloma annulare     Past Surgical History:  Procedure Laterality Date   APPENDECTOMY      There were no vitals filed for this visit.   Subjective Assessment - 04/16/21 1533     Subjective Pt states she stopped taking Ibuprofen.  pt reports she felt better the following day after prior Rx.  Pt states her pain was less the following day but she also used pain patches.  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  MD note indicates Voltaren gel topically and continuing water aerobics were recommended.  Pt states some days she feels better, some days not.  Pt states she is able to ambulate a little further with rollator and was able to ambulate to the pool with her rollator today.    Pertinent History Fall Risk.  Granuloma Annulare, major depressive disoder, legionnairre's disease, dengue fever, DM, memory loss, anxiety.    Currently in Pain? Yes    Pain Score 10-Worst pain ever    Pain Location --   thoracic area   Multiple Pain Sites Yes    Pain Score 8    Pain Location --   lumbar   Pain Score --   7-8   Pain Location Knee    Pain Orientation Right              Pt seen  for aquatic therapy today.  Treatment took place in water 3.5-4 ft in depth at the [facility]. Temp of water was 94.  Pt entered/exited he pool via steps with bilat rails and CGA with gait belt.   Ambulation with yellow noodle forward with verbal and visual cuing to stand up straight; sidestepping with yellow noodle with cuing for proper stepping.  PT provided assistance 2-3 times for LOB Pt stood without UE assistance with SBA x 1 min  Seated Marching 2x10 Seated LAQ 2x10 bilat seated heel raises and toe raises 2x10 reps bilat Seated hip abd/add 2 x 10 reps bilat Seated bicycles 2x10 reps Attempted sit to stands from pool bench though pt unable to perform without assistance.     Pt requires buoyancy for support and to offload joints with strengthening exercises. Viscosity of the water is needed for resistance of strengthening; water current perturbations provides challenge to standing balance unsupported, requiring increased core activation                          PT Education - 04/16/21 2350     Education Details exercise form  and posture    Person(s) Educated Patient    Methods Explanation;Demonstration;Tactile cues;Verbal cues    Comprehension Verbalized understanding;Returned demonstration;Tactile cues required;Verbal cues required              PT Short Term Goals - 03/11/21 2103       PT SHORT TERM GOAL #1   Title Pt will be able to perform sit to stand transfers safely and independently.    Baseline requires assistance    Time 6    Period Weeks    Status Not Met    Target Date 04/22/21      PT SHORT TERM GOAL #2   Title Pt will report at least a 25% improvement overall in pain and mobility.    Time 4    Period Weeks    Status Not Met    Target Date 04/08/21      PT SHORT TERM GOAL #3   Title Pt will ambulate at least 100 ft with improved gait speed and correct usage of walker.    Time 4    Period Weeks    Status Not Met     Target Date 04/08/21      PT SHORT TERM GOAL #5   Title Pt will demo improved LE strength to 4- to 4/5 MMT in hip flexion, knee flex and extension, and DF bilat for improved mobility, ambulation, and tolerance to activity.    Baseline Not assessed    Time 6    Period Weeks    Status Deferred    Target Date 04/22/21      PT SHORT TERM GOAL #6   Title Pt will report improved standing duration at home to improve performance of self care activities and light IADLs.    Time 4    Period Weeks    Status Not Met    Target Date 04/08/21               PT Long Term Goals - 03/11/21 2105       PT LONG TERM GOAL #1   Title Pt will ambulate at least 200 ft with walker with no > than SBA/CGA without having to sit down    Time 6    Period Weeks    Status On-going    Target Date 04/22/21      PT LONG TERM GOAL #2   Title Pt will tolerate aquatic therapy without significant increased pain in order to establish an exercise routine to improve functional mobility and tolerance to activity.    Time 6    Period Weeks    Status On-going    Target Date 04/22/21      PT LONG TERM GOAL #3   Title Pt will be able to perform TUG in no > than 20 sec for improved mobility and gait speed.    Time 6    Period Weeks    Status Not Met    Target Date 04/22/21      PT LONG TERM GOAL #4   Title Pt will be able to ambulate community distance with walker without significant pain or difficulty.    Time 6    Period Weeks    Status Not Met    Target Date 04/22/21      PT LONG TERM GOAL #5   Title Pt will report she is able to perform bed mobility, self care activities, and some light IADLs in her home without signifiant back and LE pain.  Time 6    Period Weeks    Status Not Met    Target Date 04/22/21      PT LONG TERM GOAL #6   Title Pt will be able to perform aquatic program with appropriate assistance without increased pain for improved function, mobility, balance, strength, and activity  tolerance.    Time 6    Period Weeks    Status Not Met    Target Date 04/22/21      PT LONG TERM GOAL #7   Title Pt will report she is able to perform stairs at home safely without assistance to enter/exit home.    Time 6    Period Weeks    Status Not Met    Target Date 04/22/21      PT LONG TERM GOAL #8   Time 6    Period Weeks    Status Not Met    Target Date 04/22/21                   Plan - 04/16/21 2338     Clinical Impression Statement Pt reports high levels of pain in thoracic spine, lumbar spine, and R knee.  pt reports she is able to ambulate a little further currently and was able to ambulate to the pool with her rollator today.  Pt demonstrated improved mobility today as evidenced by ambulating to the pool and entering/exiting the pool via the steps instead of the aquatic chair lift.  Pt used bilat rails and performed a step to gait pattern with PT using a gait belt providing CGA to enter/exit the pool.  Pt demonstrated much improved tolerance to pool exercises today having improved LE mobility.  She has back pain with standing up straight though demonstrated improved ambulation and mobility in the pool today.  Pt unable to perform sit to stands from pool bench.  Pt had 2-3 LOBs in the pool with PT assistance for correction of LOB.  Pt was able to stand without UE assistance in the pool for 1 minute today.  Pt responded well to Rx.    Comorbidities Fall Risk. Granuloma Annulare, major depressive disorder. Hx of legionnairre's disease and dengue fever, DM    PT Treatment/Interventions ADLs/Self Care Home Management;Aquatic Therapy;Cryotherapy;Electrical Stimulation;Ultrasound;Moist Heat;Gait training;Stair training;Functional mobility training;Therapeutic activities;Therapeutic exercise;Balance training;Neuromuscular re-education;Manual techniques;Patient/family education    PT Next Visit Plan Land based re-assessment next visit.    Consulted and Agree with Plan of Care  Patient             Patient will benefit from skilled therapeutic intervention in order to improve the following deficits and impairments:  Abnormal gait, Decreased activity tolerance, Decreased balance, Decreased mobility, Decreased endurance, Decreased range of motion, Decreased safety awareness, Decreased strength, Difficulty walking, Pain  Visit Diagnosis: Chronic low back pain, unspecified back pain laterality, unspecified whether sciatica present  Muscle weakness (generalized)  Difficulty in walking, not elsewhere classified  Chronic pain of both knees  Other lack of coordination  Repeated falls     Problem List Patient Active Problem List   Diagnosis Date Noted   LGSIL on Pap smear of cervix 03/14/2021   Vitamin D deficiency 02/20/2021   Weakness 12/21/2020   Complaints of total body pain 12/21/2020   Chronic migraine w/o aura w/o status migrainosus, not intractable 12/21/2020   Bilateral low back pain with bilateral sciatica 12/21/2020   Major depressive disorder, recurrent episode with anxious distress ([place]) 03/14/2020   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

## 2021-04-17 ENCOUNTER — Ambulatory Visit (INDEPENDENT_AMBULATORY_CARE_PROVIDER_SITE_OTHER): Payer: No Payment, Other | Admitting: Licensed Clinical Social Worker

## 2021-04-17 DIAGNOSIS — F339 Major depressive disorder, recurrent, unspecified: Secondary | ICD-10-CM | POA: Diagnosis not present

## 2021-04-18 ENCOUNTER — Ambulatory Visit: Payer: No Typology Code available for payment source | Admitting: Physical Medicine and Rehabilitation

## 2021-04-18 NOTE — Progress Notes (Signed)
° °  THERAPIST PROGRESS NOTE   Virtual Visit via Video Note  I connected with Karen Whitehead on 04/17/21 at 10:00 AM EST by a video enabled telemedicine application and verified that I am speaking with the correct person using two identifiers.  Location: Patient: Home Provider: [facility]   I discussed the limitations of evaluation and management by telemedicine and the availability of in person appointments. The patient expressed understanding and agreed to proceed. I discussed the assessment and treatment plan with the patient. The patient was provided an opportunity to ask questions and all were answered. The patient agreed with the plan and demonstrated an understanding of the instructions.   The patient was advised to call back or seek an in-person evaluation if the symptoms worsen or if the condition fails to improve as anticipated.  I provided 42 minutes of non-face-to-face time during this encounter.  Participation Level: Active  Behavioral Response: CasualAlertDepressed  Type of Therapy: Individual Therapy  Treatment Goals addressed: Communication: dep/coping  Interventions: CBT, Solution Focused, and Supportive  Summary: Karen Whitehead is a 55 y.o. female who presents with hx of dep/anx. Today pt logs on for video session. She is noted to be in a winter coat and hat. Pt reports they lost their heat at 3am. She states son is working on [employer] loss [employer] hopefully today. Pt continues to struggle with significant S&S of dep and anx. She reports her mobility is somewhat improved and she has resumed water therapy and f/u on suggestion to see what offerings she might be able to participate in at [facility].. Pt has ongoing chronic pain that is not fully managed but states it is slightly better. Pt reports on med management appt with recommendations to decrease anx med. Pt states she is very nervous about this but says "I'll try". LCSW assessed for coping with the holidays. Pt reports Thanksgiving was  "very nice" and provides details. She states she is struggling with Christmas and becomes tearful. Pt states she is missing her mother. She states regrets r/t relationship with mother as a teen. For the first time pt reports she had an abortion at age 65 which was encouraged by her mother. Pt states this is something she has never talked about. Pt states she wanted to have the child and place for adoption but conceded for mother's wishes to abort. LCSW assisted to process thoughts/feelings. Provided grief edu/support.  LCSW strongly encouraged letter writing, which pt agrees to do before next session. Acknowledged pt's upcoming birthday. LCSW reviewed poc including scheduling prior to close of session. Pt states appreciation for care.    Suicidal/Homicidal: Nowithout intent/plan  Therapist Response: Pt receptive to care.  Plan: Return again in ~3 weeks.  Diagnosis: Axis I:  MDD with anx distress  Hermine Messick, LCSW 04/18/2021

## 2021-04-23 ENCOUNTER — Other Ambulatory Visit: Payer: Self-pay

## 2021-04-23 ENCOUNTER — Ambulatory Visit (HOSPITAL_BASED_OUTPATIENT_CLINIC_OR_DEPARTMENT_OTHER): Payer: Self-pay | Admitting: Physical Therapy

## 2021-04-30 ENCOUNTER — Ambulatory Visit (HOSPITAL_BASED_OUTPATIENT_CLINIC_OR_DEPARTMENT_OTHER): Payer: Self-pay | Admitting: Physical Therapy

## 2021-04-30 ENCOUNTER — Encounter (HOSPITAL_BASED_OUTPATIENT_CLINIC_OR_DEPARTMENT_OTHER): Payer: Self-pay | Admitting: Physical Therapy

## 2021-04-30 ENCOUNTER — Other Ambulatory Visit: Payer: Self-pay

## 2021-04-30 DIAGNOSIS — G8929 Other chronic pain: Secondary | ICD-10-CM

## 2021-04-30 DIAGNOSIS — R278 Other lack of coordination: Secondary | ICD-10-CM

## 2021-04-30 DIAGNOSIS — R296 Repeated falls: Secondary | ICD-10-CM

## 2021-04-30 DIAGNOSIS — R262 Difficulty in walking, not elsewhere classified: Secondary | ICD-10-CM

## 2021-04-30 DIAGNOSIS — M6281 Muscle weakness (generalized): Secondary | ICD-10-CM

## 2021-04-30 DIAGNOSIS — M25561 Pain in right knee: Secondary | ICD-10-CM

## 2021-04-30 NOTE — Therapy (Signed)
[facility] [place], [place], [address] Phone: [phone]   Fax:  [phone]  Physical Therapy Treatment  Patient Details  Name: Karen Whitehead MRN: [id number] Date of Birth: 02/24/1966 Referring Provider (PT): Dr. Charlott Rakes   Encounter Date: 04/30/2021   PT End of Session - 04/30/21 1029     Visit Number 14    Number of Visits 26    Date for PT Re-Evaluation 06/11/21    PT Start Time 1025    PT Stop Time 1108    PT Time Calculation (min) 43 min    Equipment Utilized During Treatment Gait belt    Activity Tolerance Patient tolerated treatment well    Behavior During Therapy WFL for tasks assessed/performed             Past Medical History:  Diagnosis Date   Granuloma annulare     Past Surgical History:  Procedure Laterality Date   APPENDECTOMY      There were no vitals filed for this visit.   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  MD note indicates Voltaren gel topically and continuing water aerobics were recommended.  Pt states some days she feels better, some days not.  Pt states she is able to ambulate a little further with rollator and ambulated into the clinic today.  pt continues to have high leves of pain and report no improvement in her pain level.  Pt states "My back is on fire".  Pt was able to ambulate into [employer] and walk minimal distance inside.  She sat the majority of the time while her friend shopped.  She has increased pain that night.  Pt's son helps pt get OOB in the AM.  pt has occasional assistance with dressing and requires assistance washing her hair in the sink from her son.  Pt reports no other functional improvement.   Pt is unable to stand to wash dishes.  Pt is planning to get injections in January and is contemplating surgery.   Pt doesn't recall how she felt after prior Rx.  Pt  continues to have falls.  She states she didn't fall last week which is really good for her but also wasn't as mobile.    Pertinent History Fall Risk.  Granuloma Annulare, major depressive disoder, legionnairre's disease, dengue fever, DM, memory loss, anxiety.    Limitations Sitting;Walking;Standing;House hold activities    Diagnostic tests Lumbar MRI:  L1-L4 mild disc bulging, but no spinal stenosis or nn root compression.  L4-5 minimal disc bulging.   L5-S1: disc and interspace appear normal.  IMPRESSION:  mild multilevel degenerative changes that do not show spinal stenosis or nerve root compression at any of the levels.         Brain MRI:  IMPRESSION:  No acute or reversible finding. Generalized brain volume loss, more  than usually seen at this age  per MD note.  Pt's hip and knee x ray are negative.    Currently in Pain? Yes    Pain Score --   8-9/10 current; 10/10 worst ; 6/10 best   Pain Location --   lumbar   Pain Location --   8-9/10 ; R knee if she puts pressure on it.               [facility] PT Assessment - 04/30/21 0001  Observation/Other Assessments   Other Surveys  Modified Oswestry    Modified Oswestry 82%      Functional Tests   Functional tests --   TUG: 1 min with rollator and SBA/CGA provided.     Strength   Right Hip Flexion --   <3/5   Right Hip ABduction --   weak, tested in sitting   Left Hip Flexion 3+/5    Left Hip ABduction --   Weak, tested in sitting   Right Knee Flexion --   Unble to tolerate resistance in stting   Right Knee Extension --   Unable to toleate resistance   Left Knee Flexion 4-/5   tested in sitting   Left Knee Extension 3+/5    Right Ankle Dorsiflexion --   Unble to tolerate resistance in stting   Right Ankle Plantar Flexion --   Unble to tolerate resistance in stting   Left Ankle Dorsiflexion 5/5    Left Ankle Plantar Flexion --   weak but tolerated increased resistance in stting     Transfers   Comments Pt required SBA/CGA with  transfers and minimal verbal cuing.      Ambulation/Gait   Ambulation Distance (Feet) --   increased distance   Assistive device Rollator    Gait Comments Pt has decreased foot clearance and step length bilat.  Pt has a very slow gait speed.  No improvements in gait though she did not have a freezing episode today.                                    PT Education - 04/30/21 2208     Education Details objective findings, POC.    Person(s) Educated Patient    Methods Explanation    Comprehension Verbalized understanding              PT Short Term Goals - 04/30/21 2208       PT SHORT TERM GOAL #1   Title Pt will be able to perform sit to stand transfers safely and independently.    Time 6    Period Weeks    Status On-going      PT SHORT TERM GOAL #2   Title Pt will report at least a 25% improvement overall in pain and mobility.    Time 4    Period Weeks      PT SHORT TERM GOAL #3   Title Pt will ambulate at least 100 ft with improved gait speed and correct usage of walker.    Baseline slow gait speed; improved usage of walker    Time 4    Period Weeks    Status On-going   improved usage of walker     PT SHORT TERM GOAL #4   Title Pt will  improve TUG time to no > 64min and 20 seconds with walker.    Time 5    Period Weeks    Status Achieved      PT SHORT TERM GOAL #5   Title Pt will demo improved LE strength to 4- to 4/5 MMT in hip flexion, knee flex and extension, and DF bilat for improved mobility, ambulation, and tolerance to activity.    Time 6    Period Weeks    Status Not Met   improved L LE strength     PT SHORT TERM GOAL #6   Title Pt will report improved  standing duration at home to improve performance of self care activities and light IADLs.    Time 4    Period Weeks    Status Not Met               PT Long Term Goals - 04/30/21 2213       PT LONG TERM GOAL #1   Title Pt will ambulate at least 200 ft with walker  with no > than SBA/CGA without having to sit down    Time 6    Period Weeks    Status Partially Met      PT LONG TERM GOAL #2   Title Pt will tolerate aquatic therapy without significant increased pain in order to establish an exercise routine to improve functional mobility and tolerance to activity.    Time 6    Period Weeks    Status On-going   progressing     PT LONG TERM GOAL #3   Title Pt will be able to perform TUG in no > than 20 sec for improved mobility and gait speed.    Time 6    Period Weeks    Status Not Met      PT LONG TERM GOAL #4   Title Pt will be able to ambulate community distance with walker without significant pain or difficulty.    Time 6    Period Weeks    Status Not Met      PT LONG TERM GOAL #5   Title Pt will report she is able to perform bed mobility, self care activities, and some light IADLs in her home without signifiant back and LE pain.    Time 6    Period Weeks    Status Not Met      PT LONG TERM GOAL #6   Title Pt will be able to perform aquatic program with appropriate assistance without increased pain for improved function, mobility, balance, strength, and activity tolerance.    Time 6    Period Weeks    Status On-going      PT LONG TERM GOAL #7   Title Pt will report she is able to perform stairs at home safely without assistance to enter/exit home.    Time 6    Period Weeks                   Plan - 04/30/21 1032     Clinical Impression Statement Pt has only received 3 aquatic visits since last recert due to scheduling issues and medical issues.   Pt has improved with strength in L LE though no change in strength in R LE except worse R knee extension.  Pt demonstrates improved TUG score by 15 seconds.  She also is able to perform transfers with reduced assistance and increased independence.  Pt has no change in quality of gait though is increasing her distance.   Pt demonstrated improved mobility and was able to ambulate to  the pool and enter/exit the pool via the steps instead of the aquatic chair lift last Rx.  Pt continues to have high levels of pain and reports no improvement in pain.  Though she has not improved in pain she is ambulating further distance.  Pt has not met goals and has made very limited progress toward goals.  Pt has improved with tolerance to aquatic exercises and may benefit from cont skilled PT services to improve tolerance to activity and strength, address goals, and maximize functional  mobility.    Comorbidities Fall Risk. Granuloma Annulare, major depressive disorder. Hx of legionnairre's disease and dengue fever, DM    PT Frequency 2x / week    PT Duration 6 weeks    PT Treatment/Interventions ADLs/Self Care Home Management;Aquatic Therapy;Cryotherapy;Electrical Stimulation;Ultrasound;Moist Heat;Gait training;Stair training;Functional mobility training;Therapeutic activities;Therapeutic exercise;Balance training;Neuromuscular re-education;Manual techniques;Patient/family education    PT Next Visit Plan Cont with aquatic therapy    Consulted and Agree with Plan of Care Patient             Patient will benefit from skilled therapeutic intervention in order to improve the following deficits and impairments:  Abnormal gait, Decreased activity tolerance, Decreased balance, Decreased mobility, Decreased endurance, Decreased range of motion, Decreased safety awareness, Decreased strength, Difficulty walking, Pain  Visit Diagnosis: Chronic low back pain, unspecified back pain laterality, unspecified whether sciatica present  Muscle weakness (generalized)  Difficulty in walking, not elsewhere classified  Chronic pain of both knees  Other lack of coordination  Repeated falls     Problem List Patient Active Problem List   Diagnosis Date Noted   LGSIL on Pap smear of cervix 03/14/2021   Vitamin D deficiency 02/20/2021   Weakness 12/21/2020   Complaints of total body pain 12/21/2020    Chronic migraine w/o aura w/o status migrainosus, not intractable 12/21/2020   Bilateral low back pain with bilateral sciatica 12/21/2020   Major depressive disorder, recurrent episode with anxious distress ([place]) 03/14/2020   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

## 2021-05-07 ENCOUNTER — Encounter (HOSPITAL_BASED_OUTPATIENT_CLINIC_OR_DEPARTMENT_OTHER): Payer: Self-pay | Admitting: Physical Therapy

## 2021-05-07 ENCOUNTER — Ambulatory Visit (HOSPITAL_BASED_OUTPATIENT_CLINIC_OR_DEPARTMENT_OTHER): Payer: Self-pay | Attending: Family Medicine | Admitting: Physical Therapy

## 2021-05-07 ENCOUNTER — Other Ambulatory Visit: Payer: Self-pay

## 2021-05-07 DIAGNOSIS — R296 Repeated falls: Secondary | ICD-10-CM | POA: Insufficient documentation

## 2021-05-07 DIAGNOSIS — R252 Cramp and spasm: Secondary | ICD-10-CM | POA: Insufficient documentation

## 2021-05-07 DIAGNOSIS — M25562 Pain in left knee: Secondary | ICD-10-CM | POA: Insufficient documentation

## 2021-05-07 DIAGNOSIS — M25561 Pain in right knee: Secondary | ICD-10-CM | POA: Insufficient documentation

## 2021-05-07 DIAGNOSIS — Z0271 Encounter for disability determination: Secondary | ICD-10-CM

## 2021-05-07 DIAGNOSIS — M6281 Muscle weakness (generalized): Secondary | ICD-10-CM | POA: Insufficient documentation

## 2021-05-07 DIAGNOSIS — R262 Difficulty in walking, not elsewhere classified: Secondary | ICD-10-CM | POA: Insufficient documentation

## 2021-05-07 DIAGNOSIS — M545 Low back pain, unspecified: Secondary | ICD-10-CM | POA: Insufficient documentation

## 2021-05-07 DIAGNOSIS — R278 Other lack of coordination: Secondary | ICD-10-CM | POA: Insufficient documentation

## 2021-05-07 DIAGNOSIS — G8929 Other chronic pain: Secondary | ICD-10-CM | POA: Insufficient documentation

## 2021-05-07 NOTE — Therapy (Signed)
[place], [place], [address] Phone: [phone]   Fax:  [phone]  Physical Therapy Treatment  Patient Details  Name: Karen Whitehead MRN: [id number] Date of Birth: [birth year] Referring Provider (PT): Dr. Charlott Rakes   Encounter Date: 05/07/2021   PT End of Session - 05/07/21 1510     Visit Number 15    Number of Visits 26    Date for PT Re-Evaluation 06/11/21    PT Start Time 1450    PT Stop Time 1535    PT Time Calculation (min) 45 min    Equipment Utilized During Treatment Gait belt    Activity Tolerance Patient tolerated treatment well    Behavior During Therapy [facility] for tasks assessed/performed             Past Medical History:  Diagnosis Date   Granuloma annulare     Past Surgical History:  Procedure Laterality Date   APPENDECTOMY      There were no vitals filed for this visit.   Subjective Assessment - 05/07/21 1543     Subjective "Having a good day"    Currently in Pain? Yes    Pain Score 8     Pain Location Back    Pain Orientation Right;Left    Pain Descriptors / Indicators Constant    Pain Type Chronic pain    Pain Onset More than a month ago    Pain Frequency Constant                                             PT Short Term Goals - 04/30/21 2208       PT SHORT TERM GOAL #1   Title Pt will be able to perform sit to stand transfers safely and independently.    Time 6    Period Weeks    Status On-going      PT SHORT TERM GOAL #2   Title Pt will report at least a 25% improvement overall in pain and mobility.    Time 4    Period Weeks      PT SHORT TERM GOAL #3   Title Pt will ambulate at least 100 ft with improved gait speed and correct usage of walker.    Baseline slow gait speed; improved usage of walker    Time 4    Period Weeks    Status On-going   improved usage of walker     PT SHORT TERM GOAL #4   Title Pt will   improve TUG time to no > 22min and 20 seconds with walker.    Time 5    Period Weeks    Status Achieved      PT SHORT TERM GOAL #5   Title Pt will demo improved LE strength to 4- to 4/5 MMT in hip flexion, knee flex and extension, and DF bilat for improved mobility, ambulation, and tolerance to activity.    Time 6    Period Weeks    Status Not Met   improved L LE strength     PT SHORT TERM GOAL #6   Title Pt will report improved standing duration at home to improve performance of self care activities and light IADLs.    Time 4    Period Weeks    Status Not Met  PT Long Term Goals - 04/30/21 2213       PT LONG TERM GOAL #1   Title Pt will ambulate at least 200 ft with walker with no > than SBA/CGA without having to sit down    Time 6    Period Weeks    Status Partially Met      PT LONG TERM GOAL #2   Title Pt will tolerate aquatic therapy without significant increased pain in order to establish an exercise routine to improve functional mobility and tolerance to activity.    Time 6    Period Weeks    Status On-going   progressing     PT LONG TERM GOAL #3   Title Pt will be able to perform TUG in no > than 20 sec for improved mobility and gait speed.    Time 6    Period Weeks    Status Not Met      PT LONG TERM GOAL #4   Title Pt will be able to ambulate community distance with walker without significant pain or difficulty.    Time 6    Period Weeks    Status Not Met      PT LONG TERM GOAL #5   Title Pt will report she is able to perform bed mobility, self care activities, and some light IADLs in her home without signifiant back and LE pain.    Time 6    Period Weeks    Status Not Met      PT LONG TERM GOAL #6   Title Pt will be able to perform aquatic program with appropriate assistance without increased pain for improved function, mobility, balance, strength, and activity tolerance.    Time 6    Period Weeks    Status On-going      PT LONG  TERM GOAL #7   Title Pt will report she is able to perform stairs at home safely without assistance to enter/exit home.    Time 6    Period Weeks            Pt seen for aquatic therapy today.  Treatment took place in water 3.5-4 ft in depth at the [place]. Temp of water was 94.  Pt entered/exited he pool via steps with bilat rails and CGA with gait belt.   Ambulation with unilateral hha forward with verbal and visual cuing to stand up straight working way into 4 ft multiple widths of pool. Cues for upright positioning as well as heel strike and knee flex through swing LLE. Seated Marching x10 seated heel raises and toe raises 2x10 reps bilat Seated hip abd/add 2 x 10 reps bilat Seated bicycles 2x10 reps Standing df/pf 2 x 10 Standing marching; add/abd x10.  Cues for improved LLE movement Suspended by yellow noodle pt propels 2 lengths of pool in prone position using ue and le.  Pt requires buoyancy for support and to offload joints with strengthening exercises. Viscosity of the water is needed for resistance of strengthening; water current perturbations provides challenge to standing balance unsupported, requiring increased core activation       Plan - 05/07/21 1550     Clinical Impression Statement Pt enters and exits pool via steps using step to pattern.  She is progressed to amb HHA rather than with noodle submerged to chest for improved upright position with ue at side mimicking a more normal gait pattern.  Despite submerged position she continues to have some discomfort in  mid thoracic back. Exercises in standing position tolerated.  Pt completes 2 laps of forward facing/prone propulsion using upper and lower extremities supported by yellow noodle under arms for aerobic capacity benefit indep.    Stability/Clinical Decision Making Evolving/Moderate complexity    Clinical Decision Making Moderate    Rehab Potential Fair    PT Frequency 2x / week    PT Duration  6 weeks    PT Treatment/Interventions ADLs/Self Care Home Management;Aquatic Therapy;Cryotherapy;Electrical Stimulation;Ultrasound;Moist Heat;Gait training;Stair training;Functional mobility training;Therapeutic activities;Therapeutic exercise;Balance training;Neuromuscular re-education;Manual techniques;Patient/family education    PT Next Visit Plan Cont with aquatic therapy             Patient will benefit from skilled therapeutic intervention in order to improve the following deficits and impairments:  Abnormal gait, Decreased activity tolerance, Decreased balance, Decreased mobility, Decreased endurance, Decreased range of motion, Decreased safety awareness, Decreased strength, Difficulty walking, Pain  Visit Diagnosis: Chronic low back pain, unspecified back pain laterality, unspecified whether sciatica present  Muscle weakness (generalized)  Difficulty in walking, not elsewhere classified  Chronic pain of both knees  Other lack of coordination  Repeated falls     Problem List Patient Active Problem List   Diagnosis Date Noted   LGSIL on Pap smear of cervix 03/14/2021   Vitamin D deficiency 02/20/2021   Weakness 12/21/2020   Complaints of total body pain 12/21/2020   Chronic migraine w/o aura w/o status migrainosus, not intractable 12/21/2020   Bilateral low back pain with bilateral sciatica 12/21/2020   Major depressive disorder, recurrent episode with anxious distress ([place]) 03/14/2020   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 [place], [place], [address] Phone: [phone]   Fax:  [phone]  Name: BARBARANN KELLY MRN: [id number] Date of Birth: [birth year]

## 2021-05-08 ENCOUNTER — Telehealth (HOSPITAL_COMMUNITY): Payer: Self-pay | Admitting: *Deleted

## 2021-05-08 ENCOUNTER — Telehealth: Payer: Self-pay | Admitting: Orthopedic Surgery

## 2021-05-08 ENCOUNTER — Other Ambulatory Visit (HOSPITAL_COMMUNITY): Payer: Self-pay | Admitting: Psychiatry

## 2021-05-08 DIAGNOSIS — F411 Generalized anxiety disorder: Secondary | ICD-10-CM

## 2021-05-08 DIAGNOSIS — F3181 Bipolar II disorder: Secondary | ICD-10-CM

## 2021-05-08 MED ORDER — DIVALPROEX SODIUM ER 500 MG PO TB24: 500.0000 mg | ORAL_TABLET | Freq: Every evening | ORAL | 6 refills | Status: DC

## 2021-05-08 NOTE — Telephone Encounter (Signed)
Provider discussed reducing Klonopin at patient's last visit.  Provider still believes that reduction is in patient's best interest.  At this time Klonopin reduced to 0.5 mg twice daily as needed.  Medication refilled and sent to preferred pharmacy.

## 2021-05-08 NOTE — Telephone Encounter (Signed)
Called wanting to keep her Klonopin dose the same but consulted with Dr Ronne Binning and she wants it to be lowered, her dose will be half a milligram bid. When I called to tell her that she was disappointed and I asked her if she were taking her other medicines to help her manage and it is ok to be upset and angry from time to time and she needs to learn to manage her stress without an antianxiety pill. Discussed her other meds and when I asked her about the Depakote she knew nothing of it and did not have the medicine in her house. [facility] is closed to move their office and in their place [facility] handling the meds for the community patients but she did not know where it was or have transport so wanted her depakote called to her [facility] because she can get to it ,its close to her house. Told her to call and check the price, she did but said they couldn't give her the exact price without the rx. [facility] move the rx [facility].

## 2021-05-08 NOTE — Telephone Encounter (Signed)
Patient called advised she is still in a lot of pain and wanted to know if she can get something called into her pharmacy  for pain? Patient uses [facility] [address]. The number to contact patient is [phone].

## 2021-05-08 NOTE — Telephone Encounter (Signed)
Call from patient stating she is apprehensive about her Klonopin being cut in half as was discussed at her last appt. States she had cut down on some of her medicines and she became really anxious and had a hard time calming herself down and she doesn't feel ready for the Klonopin to be reduced. She wanted me to share this information with her provider. Will forward this note to [place].

## 2021-05-08 NOTE — Telephone Encounter (Signed)
Can you please call and make an appt 04/15/21 last office visit if failure of conservative treatment should have follow up with either Dr. Sharol Given or Junie Panning please. Thanks!

## 2021-05-09 ENCOUNTER — Telehealth: Payer: Self-pay | Admitting: Orthopedic Surgery

## 2021-05-09 NOTE — Telephone Encounter (Signed)
Pt states that she is in a lot of pain and can't even stand to walk on it. She wants to know can she be prescribed something.   CB [phone]

## 2021-05-09 NOTE — Telephone Encounter (Signed)
Can you please call and make an appt. If the pain is this severe pt should be seen in office. Let me know if you need a time opened for Dr. Sharol Given or Junie Panning

## 2021-05-10 ENCOUNTER — Other Ambulatory Visit: Payer: Self-pay

## 2021-05-10 ENCOUNTER — Ambulatory Visit (AMBULATORY_SURGERY_CENTER): Payer: No Typology Code available for payment source | Admitting: *Deleted

## 2021-05-10 VITALS — Ht 64.0 in | Wt 232.0 lb

## 2021-05-10 DIAGNOSIS — Z1211 Encounter for screening for malignant neoplasm of colon: Secondary | ICD-10-CM

## 2021-05-10 MED ORDER — PLENVU 140 G PO SOLR: 1.0000 | Freq: Once | ORAL | 0 refills | Status: AC

## 2021-05-10 NOTE — Progress Notes (Signed)
Patient's pre-visit was done today over the phone with the patient. Name,DOB and address verified. Patient denies any allergies to Eggs and Soy. Patient denies any problems with anesthesia/sedation. Patient is not taking any diet pills or blood thinners. No home Oxygen. Packet of Prep instructions on 2nd floor with Plenvu sample including a copy of a consent form-pt is aware. Prep instructions sent to pt's MyChart .Patient understands to call us back with any questions or concerns. Patient is aware of our care-partner policy and WUXLK-44 safety protocol. Pt c/o constipation-2day prep given.  EMMI education assigned to the patient for the procedure, sent to [place].   The patient is COVID-19 vaccinated.

## 2021-05-13 ENCOUNTER — Encounter (HOSPITAL_BASED_OUTPATIENT_CLINIC_OR_DEPARTMENT_OTHER): Payer: Self-pay | Admitting: Physical Therapy

## 2021-05-13 ENCOUNTER — Other Ambulatory Visit: Payer: Self-pay

## 2021-05-13 ENCOUNTER — Ambulatory Visit (HOSPITAL_BASED_OUTPATIENT_CLINIC_OR_DEPARTMENT_OTHER): Payer: Self-pay | Admitting: Physical Therapy

## 2021-05-13 DIAGNOSIS — G8929 Other chronic pain: Secondary | ICD-10-CM

## 2021-05-13 DIAGNOSIS — R278 Other lack of coordination: Secondary | ICD-10-CM

## 2021-05-13 DIAGNOSIS — R252 Cramp and spasm: Secondary | ICD-10-CM

## 2021-05-13 DIAGNOSIS — R262 Difficulty in walking, not elsewhere classified: Secondary | ICD-10-CM

## 2021-05-13 DIAGNOSIS — M6281 Muscle weakness (generalized): Secondary | ICD-10-CM

## 2021-05-13 DIAGNOSIS — M545 Low back pain, unspecified: Secondary | ICD-10-CM

## 2021-05-13 NOTE — Therapy (Signed)
[place], [place], [address] Phone: [phone]   Fax:  [phone]  Physical Therapy Treatment  Patient Details  Name: Karen Whitehead MRN: [id number] Date of Birth: [birth year] Referring Provider (PT): Dr. Charlott Rakes   Encounter Date: 05/13/2021   PT End of Session - 05/13/21 1321     Visit Number 16    Number of Visits 26    Date for PT Re-Evaluation 06/11/21    PT Start Time 1210    PT Stop Time 1250    PT Time Calculation (min) 40 min    Equipment Utilized During Treatment Gait belt    Activity Tolerance Patient tolerated treatment well    Behavior During Therapy WFL for tasks assessed/performed             Past Medical History:  Diagnosis Date   Anxiety    Arthritis    Chronic kidney disease    Depression    Diabetes mellitus without complication ([place])    Granuloma annulare    Hyperlipidemia     Past Surgical History:  Procedure Laterality Date   APPENDECTOMY      There were no vitals filed for this visit.   Subjective Assessment - 05/13/21 1318     Subjective Complaints of left knee pain and muscle spasms.                                          PT Short Term Goals - 04/30/21 2208       PT SHORT TERM GOAL #1   Title Pt will be able to perform sit to stand transfers safely and independently.    Time 6    Period Weeks    Status On-going      PT SHORT TERM GOAL #2   Title Pt will report at least a 25% improvement overall in pain and mobility.    Time 4    Period Weeks      PT SHORT TERM GOAL #3   Title Pt will ambulate at least 100 ft with improved gait speed and correct usage of walker.    Baseline slow gait speed; improved usage of walker    Time 4    Period Weeks    Status On-going   improved usage of walker     PT SHORT TERM GOAL #4   Title Pt will  improve TUG time to no > 104min and 20 seconds with walker.    Time 5     Period Weeks    Status Achieved      PT SHORT TERM GOAL #5   Title Pt will demo improved LE strength to 4- to 4/5 MMT in hip flexion, knee flex and extension, and DF bilat for improved mobility, ambulation, and tolerance to activity.    Time 6    Period Weeks    Status Not Met   improved L LE strength     PT SHORT TERM GOAL #6   Title Pt will report improved standing duration at home to improve performance of self care activities and light IADLs.    Time 4    Period Weeks    Status Not Met               PT Long Term Goals - 04/30/21 2213       PT LONG TERM  GOAL #1   Title Pt will ambulate at least 200 ft with walker with no > than SBA/CGA without having to sit down    Time 6    Period Weeks    Status Partially Met      PT LONG TERM GOAL #2   Title Pt will tolerate aquatic therapy without significant increased pain in order to establish an exercise routine to improve functional mobility and tolerance to activity.    Time 6    Period Weeks    Status On-going   progressing     PT LONG TERM GOAL #3   Title Pt will be able to perform TUG in no > than 20 sec for improved mobility and gait speed.    Time 6    Period Weeks    Status Not Met      PT LONG TERM GOAL #4   Title Pt will be able to ambulate community distance with walker without significant pain or difficulty.    Time 6    Period Weeks    Status Not Met      PT LONG TERM GOAL #5   Title Pt will report she is able to perform bed mobility, self care activities, and some light IADLs in her home without signifiant back and LE pain.    Time 6    Period Weeks    Status Not Met      PT LONG TERM GOAL #6   Title Pt will be able to perform aquatic program with appropriate assistance without increased pain for improved function, mobility, balance, strength, and activity tolerance.    Time 6    Period Weeks    Status On-going      PT LONG TERM GOAL #7   Title Pt will report she is able to perform stairs at home  safely without assistance to enter/exit home.    Time 6    Period Weeks           Pt seen for aquatic therapy today.  Treatment took place in water 3.5-4 ft in depth at the [place]. Temp of water was 94.  Pt entered pool via steps with bilat rails and CGA, exits via lift    Using noodle suspened in 4.6 ft: scissoring; cycling; add/abd;hip circles and knee flex/ext for loosening joints, stretching and strengthening. Seated left patellar mobs and hamstring tendon massage -knee flex/ext, SLR (flutter at hip)  Seated straddle on noodle for balance training;with ue supported by hand buoys cycling Standing df/pf 2 x 10 Gait training 50% submerged using yellow noodle fwd and backward.  Cues for proper heel/toe strike and knee flex through swing.  Pt requires buoyancy for support and to offload joints with strengthening exercises. Viscosity of the water is needed for resistance of strengthening; water current perturbations provides challenge to standing balance unsupported, requiring increased core activation          Plan - 05/13/21 1322     Clinical Impression Statement Pt enters pool again via steps demonstrating good strength preferring to hop down them despite instructions of step to pattern.  During session today pt does experieince a right le muscle cramp in posterior aspect from hip to toes.  Focus on le movment for increased circulation as well as use of hydrostatic pressure for decreased edema and pain. Left patella and kne ant-post mobilization.  She improves movement by end of treatment balancing and cycling, straddling noodle indep. She does exit via lift,. Pt  encouraged to get access to pool if able to benefit from continued aqutic HEP upon dc.    Stability/Clinical Decision Making Evolving/Moderate complexity    Clinical Decision Making Moderate    Rehab Potential Fair    PT Frequency 2x / week    PT Duration 6 weeks    PT Treatment/Interventions ADLs/Self  Care Home Management;Aquatic Therapy;Cryotherapy;Electrical Stimulation;Ultrasound;Moist Heat;Gait training;Stair training;Functional mobility training;Therapeutic activities;Therapeutic exercise;Balance training;Neuromuscular re-education;Manual techniques;Patient/family education             Patient will benefit from skilled therapeutic intervention in order to improve the following deficits and impairments:  Abnormal gait, Decreased activity tolerance, Decreased balance, Decreased mobility, Decreased endurance, Decreased range of motion, Decreased safety awareness, Decreased strength, Difficulty walking, Pain  Visit Diagnosis: Chronic low back pain, unspecified back pain laterality, unspecified whether sciatica present  Muscle weakness (generalized)  Difficulty in walking, not elsewhere classified  Chronic pain of both knees  Other lack of coordination  Cramp and spasm     Problem List Patient Active Problem List   Diagnosis Date Noted   LGSIL on Pap smear of cervix 03/14/2021   Vitamin D deficiency 02/20/2021   Weakness 12/21/2020   Complaints of total body pain 12/21/2020   Chronic migraine w/o aura w/o status migrainosus, not intractable 12/21/2020   Bilateral low back pain with bilateral sciatica 12/21/2020   Major depressive disorder, recurrent episode with anxious distress ([place]) 03/14/2020   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

## 2021-05-14 ENCOUNTER — Telehealth: Payer: Self-pay | Admitting: Orthopedic Surgery

## 2021-05-14 ENCOUNTER — Ambulatory Visit: Payer: Self-pay | Admitting: Family

## 2021-05-14 NOTE — Telephone Encounter (Signed)
Tried to call pt but unable to leave a vm (mailbox not set up yet) Per Junie Panning pt can continue to use Voltaren gel several times a day not able to give rx for pain medication. Xrays at last visit were clear and if there is pain that is causing pt not to be able to wtb or sleep then we need to eval what is happening. Will hold this message and try again later today.

## 2021-05-14 NOTE — Telephone Encounter (Signed)
I sw pt and advised of message below. Pt states she will keep her appt on 05/22/21

## 2021-05-14 NOTE — Telephone Encounter (Signed)
Pt had to reschedule appt due to [facility] transportation they never showed and call pt at last min past her 15 min late. Pt is asking to send thru [facility] portal for upcoming appt 1/18 at 1:45 pm. Pt is also asking for pain medication. Pt states pain in her knee is causing her not to sleep at night. Please send to pharmacy on file. Please call pt about this matter at [phone].

## 2021-05-15 ENCOUNTER — Encounter: Payer: Self-pay | Attending: Physical Medicine and Rehabilitation | Admitting: Physical Medicine and Rehabilitation

## 2021-05-15 ENCOUNTER — Ambulatory Visit (HOSPITAL_BASED_OUTPATIENT_CLINIC_OR_DEPARTMENT_OTHER): Payer: Self-pay | Admitting: Physical Therapy

## 2021-05-15 ENCOUNTER — Other Ambulatory Visit: Payer: Self-pay

## 2021-05-15 DIAGNOSIS — G894 Chronic pain syndrome: Secondary | ICD-10-CM | POA: Insufficient documentation

## 2021-05-15 DIAGNOSIS — Z79891 Long term (current) use of opiate analgesic: Secondary | ICD-10-CM | POA: Insufficient documentation

## 2021-05-15 DIAGNOSIS — Z5181 Encounter for therapeutic drug level monitoring: Secondary | ICD-10-CM | POA: Insufficient documentation

## 2021-05-15 DIAGNOSIS — M791 Myalgia, unspecified site: Secondary | ICD-10-CM | POA: Insufficient documentation

## 2021-05-15 NOTE — Progress Notes (Signed)
Subjective:    Patient ID: Karen Whitehead, female    DOB: 08-24-1965, 56 y.o.   MRN: [id number]  HPI  An audio/video tele-health visit is felt to be the most appropriate encounter for this patient at this time. This is a follow up tele-visit via phone. The patient is at home. MD is at office. Prior to scheduling this appointment, our staff discussed the limitations of evaluation and management by telemedicine and the availability of in-person appointments. The patient expressed understanding and agreed to proceed.   Karen Whitehead is a 56 year old woman who presents for follow-up of myalgias post-dengue fever.  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  -she gets lots of headaches -reviewed her hip and knee XRs with her: negative -she would like to get XRs of her spine as well.  -her hands and muscles throb so baldy that she cannot fall asleep at night -she takes one muscle relaxer in the morning -she used to be very active and takes girls trips to the beach.  -in tears today when she heard we had to reschedule her appointment for tomorrow because she was hoping to at least get some relief from the trigger point injections -she can barely get out of bed without assistance due to the severity of pain in the muscles of her back -she does not want to try any more muscle relaxers as these do not help enough -she has never tried tylenol with codeine.  -she does not want to increase the amitriptyline further   2) Insomnia: -she currently takes trazodone and sertraline.  -she has not found relief with the amitriptyline 10mg .  -her pain keeps her awake  3) memory loss -has not worked with SLP before -does not follow with neurology -discussed results of her  MRI brain with her- shows degeneration atypical for her age but no acute processes.   4) Rash- has to wear everything shortsleeve  5) Lumbar DDD: -she cannot handle hte pain -muscle relaxers don't help -she loves aquatic therapy- the take their time and are trying to build her muscles -she has an EMG/NCS scheduled -she wants a medicine to help her get through the pain today.  -she has tried ibuprofen and was was told not to because of her kidneys -had ESI and could not walk after -still has pain in her lower back where her pain is the worst -pain does not radiate into the legs/ -the trigger point injections helped for 12-13 days -her son heats rags and lays these on her back  6) Fall: -she fell sideways on her elbow when she could not judge the distance appropriately. -she fell on her knee recently.  -she is going to be seeing an orthopedic doctor  7) Vision goes in and out -sometimes she can see the TV and sometimes she can't   Diet: her son usually cooks her meals for her.    Pain Inventory Average Pain 9 Pain Right Now 9 My pain is constant, sharp, stabbing, tingling and aching  In the last 24 hours, has pain interfered with the following? General activity 10 Relation with others 10 Enjoyment of life 10 What TIME of day is your pain at its worst? morning , daytime, evening and  night Sleep (in general) Poor  Pain is worse with: walking, bending, inactivity and standing Pain improves with: medication, injections Relief from Meds: 3  use a walker how many minutes can you walk? 5 ability to climb steps?  yes do you drive?  no  not employed: date last employed . I need assistance with the following:  dressing, bathing, household duties and shopping  bladder control problems weakness numbness tingling trouble walking spasms dizziness confusion depression anxiety suicidal thoughts        Family History  Problem Relation Age of Onset   Stomach cancer  Mother    Heart attack Father    Breast cancer Neg Hx    Colon cancer Neg Hx    Colon polyps Neg Hx    Social History   Socioeconomic History   Marital status: Single    Spouse name: Not on file   Number of children: 1   Years of education: Not on file   Highest education level: Some college, no degree  Occupational History   Not on file  Tobacco Use   Smoking status: Every Day    Packs/day: 0.50    Types: Cigarettes   Smokeless tobacco: Never  Vaping Use   Vaping Use: Never used  Substance and Sexual Activity   Alcohol use: No   Drug use: No   Sexual activity: Not Currently  Other Topics Concern   Not on file  Social History Narrative   Not on file   Social Determinants of Health   Financial Resource Strain: Not on file  Food Insecurity: Food Insecurity Present   Worried About [place] in the Last Year: Often true   Ran Out of Food in the Last Year: Sometimes true  Transportation Needs: No Transportation Needs   Lack of Transportation (Medical): No   Lack of Transportation (Non-Medical): No  Physical Activity: Not on file  Stress: Not on file  Social Connections: Not on file   Past Surgical History:  Procedure Laterality Date   APPENDECTOMY     Past Medical History:  Diagnosis Date   Anxiety    Arthritis    Chronic kidney disease    Depression    Diabetes mellitus without complication ([place])    Granuloma annulare    Hyperlipidemia    There were no vitals taken for this visit.  Opioid Risk Score:   Fall Risk Score:  `1  Depression screen PHQ 2/9  Depression screen [facility] 2/9 03/14/2021 03/08/2021 12/17/2020 10/31/2020 09/20/2020 02/06/2020 11/16/2019  Decreased Interest 3 1 3 3 3 3 3   Down, Depressed, Hopeless 3 1 3 3 3 3 3   PHQ - 2 Score 6 2 6 6 6 6 6   Altered sleeping 3 - 3 3 3 3 3   Tired, decreased energy 3 - 3 3 3 3 3   Change in appetite 3 - 3 3 3 3 3   Feeling bad or failure about yourself  3 - 3 3 3 3 3   Trouble concentrating 3 - 3 3 - 3  3  Moving slowly or fidgety/restless 3 - 3 3 2 1  0  Suicidal thoughts 2 - 3 2 1 2 1   PHQ-9 Score 26 - 27 26 21 24 22   Difficult doing work/chores - - - - Extremely dIfficult - -  Some encounter information is confidential and restricted. Go to Review Flowsheets activity to see all data.  Some recent data might be hidden    Review of Systems  Constitutional:  Positive for appetite change.  Respiratory:  Positive for cough and shortness of breath.   Gastrointestinal:  Positive for constipation.  Musculoskeletal:  Positive for back pain, gait problem and neck pain.       Left hip and knee pain Pain in back of legs  Neurological:  Positive for dizziness, weakness and numbness.       Tongling  Psychiatric/Behavioral:  Positive for confusion, dysphoric mood and suicidal ideas. The patient is nervous/anxious.   All other systems reviewed and are negative.     Objective:   Physical Exam Not performed as patient was seen via phone.     Assessment & Plan:  1)Chronic pain syndrome and fatigue secondary to myalgias and arthralgias following Dengue fever -Discussed current symptoms of pain and history of pain.  -Discussed benefits of exercise in reducing pain. -discussed that XRs of hip and knees are normal -ordered XRs of spine as well.  -continue water aerobics -asked staff to schedule appointment for her tomorrow at 8:20am for trigger point injections -discussed stronger muscle relaxer such as tizanidine but she does not want to try this -discussed increasing amitriptyline dose further but she does not want to try this -discussed coming into clinic for urine sample/signing pain contract in order to received controlled medications.  -cannot increase meloxicam further given risk of GI bleeding.  -cannot use cymbalta due to risk of bleed.  -discussed risks and of tylenol and NSAIDs.  -prescribed lidocaine patches.  -discussed spinal cord stimulator -Discussed Qutenza as an option for  neuropathic pain control. Discussed that this is a capsaicin patch, stronger than capsaicin cream. Discussed that it is currently approved for diabetic peripheral neuropathy and post-herpetic neuralgia, but that it has also shown benefit in treating other forms of neuropathy. Provided patient with link to site to learn more about the patch: [employer].fr. Discussed that the patch would be placed in office and benefits usually last 3 months. Discussed that unintended exposure to capsaicin can cause severe irritation of eyes, mucous membranes, respiratory tract, and skin, but that Qutenza is a local treatment and does not have the systemic side effects of other nerve medications. Discussed that there may be pain, itching, erythema, and decreased sensory function associated with the application of Qutenza. Side effects usually subside within 1 week. A cold pack of analgesic medications can help with these side effects. Blood pressure can also be increased due to pain associated with administration of the patch.  -tylenol does not help -referred to [facility] to discuss spinal cord stimulator -Provided with a pain relief journal and discussed that it contains foods and lifestyle tips to naturally help to improve pain. Discussed that these lifestyle strategies are also very good for health unlike some medications which can have negative side effects. Discussed that the act of keeping a journal can be therapeutic and helpful to realize patterns what helps to trigger and alleviate pain.   -lidocaine patches do help -increase gabapentin to 300mg  TID -Discussed current symptoms of pain and history of pain.  -Discussed benefits of exercise in reducing pain. -Discussed following foods that may reduce pain: 1) Ginger (especially studied for arthritis)- reduce leukotriene production to decrease inflammation 2) Blueberries- high in phytonutrients that decrease inflammation 3) Salmon- marine  omega-3s reduce joint swelling and pain 4) Pumpkin seeds- reduce inflammation 5) dark chocolate- reduces inflammation 6) turmeric- reduces inflammation 7) tart cherries - reduce pain and stiffness 8) extra virgin olive oil - its compound olecanthal helps to block prostaglandins  9) chili peppers- can be eaten or applied topically via capsaicin 10) mint- helpful for headache, muscle aches, joint pain, and itching 11) garlic- reduces inflammation  Link to further information on diet for chronic pain: http://www.randall.com/   2) Insomnia:  -Amitripyline discontinued by another doctor.  -seroquel produced hallucinations.   3) Left sided weakness/memory loss -MRI brain ordered to assess for possible contributory neurological process: reviewed results with her: shows degeneration atypical for her age without acute process -referred to neurology -restart 1TB coconut oil daily, discussed may feel nausea at first, goal to increase to 4 TB over time as tolerated  4) Anxiety: discussed that her clonopin has to be filled by her psychiatry or internal medicine.   5) Myofascial pain: will schedule for trigger point injections.  -continue muscle relaxers.   6) Lumbar disc degeneration with facet hypertrophy -prescribed topamax 25mg  BID -lidocaine patches seem to help -continue Aqua Therapy -Reviewed MRI of lumbar spine with her which does mild facet hypertrophy at multiple levels -Medial branch block performed for right sided pain at L3-L4 and L4-L5 levels. Patient has never medial branch blocks before. Still has pain from this and was unable to walk after  7) Visual deficits:  -encouraged follow-up with optometrist.  -advised that this could contribute to headaches.   8) Impaired mobility and ADLs -placed order for wheelchair  7 minutes spent in discussing her severe pain, benefit with trigger point injections, discussing  scheduling trigger points for her tomorrow

## 2021-05-16 ENCOUNTER — Encounter (HOSPITAL_BASED_OUTPATIENT_CLINIC_OR_DEPARTMENT_OTHER): Payer: Self-pay | Admitting: Physical Medicine and Rehabilitation

## 2021-05-16 ENCOUNTER — Other Ambulatory Visit: Payer: Self-pay | Admitting: Physical Medicine and Rehabilitation

## 2021-05-16 ENCOUNTER — Telehealth: Payer: Self-pay

## 2021-05-16 ENCOUNTER — Encounter: Payer: Self-pay | Admitting: Physical Medicine and Rehabilitation

## 2021-05-16 ENCOUNTER — Other Ambulatory Visit: Payer: Self-pay

## 2021-05-16 VITALS — BP 114/77 | HR 78 | Ht 64.0 in

## 2021-05-16 DIAGNOSIS — Z5181 Encounter for therapeutic drug level monitoring: Secondary | ICD-10-CM

## 2021-05-16 DIAGNOSIS — G894 Chronic pain syndrome: Secondary | ICD-10-CM

## 2021-05-16 DIAGNOSIS — Z79891 Long term (current) use of opiate analgesic: Secondary | ICD-10-CM

## 2021-05-16 DIAGNOSIS — M791 Myalgia, unspecified site: Secondary | ICD-10-CM

## 2021-05-16 MED ORDER — ACETAMINOPHEN-CODEINE #3 300-30 MG PO TABS
1.0000 | ORAL_TABLET | Freq: Three times a day (TID) | ORAL | 0 refills | Status: DC | PRN
  Filled 2021-05-16: qty 90, 30d supply, fill #0

## 2021-05-16 MED ORDER — BELBUCA 75 MCG BU FILM: 1.0000 | ORAL_FILM | Freq: Every day | BUCCAL | 3 refills | Status: DC

## 2021-05-16 NOTE — Progress Notes (Signed)

## 2021-05-16 NOTE — Telephone Encounter (Signed)
Karen Whitehead called back. The Belvuca 75 MG is too expensive.Patient does not have insurance she has an [employer].  She wanted to know if you will prescribe Tylenol with codeine?

## 2021-05-16 NOTE — Addendum Note (Signed)
Addended by: Jasmine December T on: 05/16/2021 09:05 AM   Modules accepted: Orders

## 2021-05-17 ENCOUNTER — Other Ambulatory Visit: Payer: Self-pay

## 2021-05-17 NOTE — Telephone Encounter (Signed)
Pt.notified

## 2021-05-19 ENCOUNTER — Encounter: Payer: Self-pay | Admitting: Certified Registered Nurse Anesthetist

## 2021-05-20 ENCOUNTER — Other Ambulatory Visit: Payer: Self-pay

## 2021-05-20 ENCOUNTER — Ambulatory Visit (AMBULATORY_SURGERY_CENTER): Payer: No Typology Code available for payment source | Admitting: Internal Medicine

## 2021-05-20 ENCOUNTER — Encounter: Payer: Self-pay | Admitting: Internal Medicine

## 2021-05-20 VITALS — BP 112/74 | HR 100 | Temp 97.5°F | Resp 23 | Ht 64.0 in | Wt 232.0 lb

## 2021-05-20 DIAGNOSIS — D122 Benign neoplasm of ascending colon: Secondary | ICD-10-CM

## 2021-05-20 DIAGNOSIS — Z1211 Encounter for screening for malignant neoplasm of colon: Secondary | ICD-10-CM

## 2021-05-20 DIAGNOSIS — D123 Benign neoplasm of transverse colon: Secondary | ICD-10-CM

## 2021-05-20 MED ORDER — SODIUM CHLORIDE 0.9 % IV SOLN: 500.0000 mL | Freq: Once | INTRAVENOUS | Status: DC

## 2021-05-20 NOTE — Progress Notes (Signed)
Called to room to assist during endoscopic procedure.  Patient ID and intended procedure confirmed with present staff. Received instructions for my participation in the procedure from the performing physician.  

## 2021-05-20 NOTE — Op Note (Signed)
[facility] Patient Name: Karen Whitehead Procedure Date: 05/20/2021 10:53 AM MRN: [id number] Endoscopist: Sonny Masters "[name] ,  Age: 56 Referring MD:  Date of Birth: 05-08-1965 Gender: Female Account #: [id number] Procedure:                Colonoscopy Indications:              Screening for colorectal malignant neoplasm, This                            is the patient's first colonoscopy Medicines:                Monitored Anesthesia Care Procedure:                Pre-Anesthesia Assessment:                           - Prior to the procedure, a History and Physical                            was performed, and patient medications and                            allergies were reviewed. The patient's tolerance of                            previous anesthesia was also reviewed. The risks                            and benefits of the procedure and the sedation                            options and risks were discussed with the patient.                            All questions were answered, and informed consent                            was obtained. Prior Anticoagulants: The patient has                            taken no previous anticoagulant or antiplatelet                            agents. ASA Grade Assessment: II - A patient with                            mild systemic disease. After reviewing the risks                            and benefits, the patient was deemed in                            satisfactory condition to undergo the procedure.  After obtaining informed consent, the colonoscope                            was passed under direct vision. Throughout the                            procedure, the patient's blood pressure, pulse, and                            oxygen saturations were monitored continuously. The                            Olympus CF-HQ190L [id number]) Colonoscope was                            introduced through the anus  and advanced to the the                            terminal ileum. The colonoscopy was performed                            without difficulty. The patient tolerated the                            procedure well. The quality of the bowel                            preparation was good. The terminal ileum, ileocecal                            valve, appendiceal orifice, and rectum were                            photographed. Scope In: 11:07:14 AM Scope Out: 11:22:46 AM Scope Withdrawal Time: 0 hours 12 minutes 40 seconds  Total Procedure Duration: 0 hours 15 minutes 32 seconds  Findings:                 The terminal ileum appeared normal.                           Two sessile polyps were found in the transverse                            colon and ascending colon. The polyps were 3 to 4                            mm in size. These polyps were removed with a cold                            snare. Resection and retrieval were complete.                           Multiple small and large-mouthed diverticula were  found in the sigmoid colon, descending colon and                            transverse colon.                           Non-bleeding internal hemorrhoids were found during                            retroflexion. Complications:            No immediate complications. Estimated Blood Loss:     Estimated blood loss was minimal. Impression:               - The examined portion of the ileum was normal.                           - Two 3 to 4 mm polyps in the transverse colon and                            in the ascending colon, removed with a cold snare.                            Resected and retrieved.                           - Diverticulosis in the sigmoid colon, in the                            descending colon and in the transverse colon.                           - Non-bleeding internal hemorrhoids. Recommendation:           - Discharge patient to home  (with escort).                           - Await pathology results.                           - The findings and recommendations were discussed                            with the patient. Sonny Masters "[name],  05/20/2021 11:27:05 AM

## 2021-05-20 NOTE — Progress Notes (Signed)
Pt's states no medical or surgical changes since previsit or office visit. 

## 2021-05-20 NOTE — Progress Notes (Signed)
GASTROENTEROLOGY PROCEDURE H&P NOTE   Primary Care Physician: Hoy Register, MD    Reason for Procedure:   Colon cancer screening  Plan:    Colonoscopy  Patient is appropriate for endoscopic procedure(s) in the ambulatory ([facility]) setting.  The nature of the procedure, as well as the risks, benefits, and alternatives were carefully and thoroughly reviewed with the patient. Ample time for discussion and questions allowed. The patient understood, was satisfied, and agreed to proceed.     HPI: Karen Whitehead is a 56 y.o. female who presents for colonoscopy for colon cancer screening. Denies blood in stools, changes in bowel habits, weight loss. Denies fam hx of colon cancer.  Past Medical History:  Diagnosis Date   Anxiety    Arthritis    Chronic kidney disease    Depression    Diabetes mellitus without complication (HCC)    Granuloma annulare    Hyperlipidemia     Past Surgical History:  Procedure Laterality Date   APPENDECTOMY      Prior to Admission medications   Medication Sig Start Date End Date Taking? Authorizing Provider  atorvastatin (LIPITOR) 40 MG tablet Take 1 tablet (40 mg total) by mouth daily. 11/01/20  Yes Hoy Register, MD  clonazePAM (KLONOPIN) 0.5 MG tablet Take 1 tablet (0.5 mg total) by mouth 2 (two) times daily as needed. for anxiety 05/08/21  Yes Toy Cookey E, NP  cyclobenzaprine (FLEXERIL) 10 MG tablet Take 1 tablet (10 mg total) by mouth 2 (two) times daily as needed for muscle spasms as needed for muscle pain 10/31/20  Yes Hoy Register, MD  divalproex (DEPAKOTE ER) 500 MG 24 hr tablet Take 1 tablet (500 mg total) by mouth at bedtime. 05/08/21  Yes Toy Cookey E, NP  gabapentin (NEURONTIN) 300 MG capsule Take 1 capsule (300 mg total) by mouth 3 (three) times daily. 04/12/21  Yes Raulkar, Drema Pry, MD  glipiZIDE (GLUCOTROL) 5 MG tablet TAKE 0.5 TABLETS (2.5 MG TOTAL) BY MOUTH 2 (TWO) TIMES DAILY BEFORE A MEAL. Patient taking  differently: Take 2.5 mg by mouth 2 (two) times daily before a meal. 07/31/20 07/31/21 Yes Newlin, Enobong, MD  risperiDONE (RISPERDAL) 1 MG tablet Take 1 tablet (1 mg total) by mouth 2 (two) times daily. 04/16/21  Yes Toy Cookey E, NP  sertraline (ZOLOFT) 100 MG tablet TAKE 2 TABLETS BY MOUTH ONCE A DAY 04/16/21  Yes Toy Cookey E, NP  SUMAtriptan (IMITREX) 50 MG tablet Take 1 tablet (50 mg total) by mouth every 2 (two) hours as needed for migraine. May repeat in 2 hours if headache persists or recurs. 12/21/20  Yes Levert Feinstein, MD  topiramate (TOPAMAX) 50 MG tablet Take 1 tablet (50 mg total) by mouth 2 (two) times daily. 03/08/21  Yes Raulkar, Drema Pry, MD  traZODone (DESYREL) 100 MG tablet TAKE 2 TABLETS BY MOUTH AT BEDTIME 04/16/21  Yes Toy Cookey E, NP  Vitamin D, Ergocalciferol, (DRISDOL) 1.25 MG (50000 UNIT) CAPS capsule Take 1 capsule (50,000 Units total) by mouth every 7 (seven) days. 04/12/21  Yes Raulkar, Drema Pry, MD  acetaminophen-codeine (TYLENOL #3) 300-30 MG tablet Take 1 tablet by mouth every 8 (eight) hours as needed for moderate pain. 05/16/21   Raulkar, Drema Pry, MD  albuterol (VENTOLIN HFA) 108 (90 Base) MCG/ACT inhaler INHALE 1-2 PUFFS INTO THE LUNGS EVERY 6 (SIX) HOURS AS NEEDED FOR WHEEZING OR SHORTNESS OF BREATH. Patient taking differently: Inhale 2 puffs into the lungs every 6 (six) hours as needed for  wheezing or shortness of breath. 01/31/21 01/31/22  Charlott Rakes, MD  Blood Glucose Monitoring Suppl (TRUE METRIX METER) w/Device KIT 1 each by Does not apply route in the morning and at bedtime. 11/03/19   Argentina Donovan, PA-C  dapagliflozin propanediol (FARXIGA) 10 MG TABS tablet Take by mouth daily.    [provider]  fluticasone (CUTIVATE) 0.005 % ointment Apply 1 application topically 2 (two) times daily.    [provider]  glucose blood (TRUE METRIX BLOOD GLUCOSE TEST) test strip Use as instructed 05/09/20   Charlott Rakes, MD   ibuprofen (ADVIL) 200 MG tablet Take 200 mg by mouth every 6 (six) hours as needed for headache, mild pain or moderate pain.    [provider]  lidocaine (LIDODERM) 5 % Place 2 patches onto the skin daily. Remove & Discard patch within 12 hours or as directed by MD 04/12/21   Raulkar, Clide Deutscher, MD  TRUEplus Lancets 28G MISC Use as directed in the morning and at bedtime. 11/03/19   Argentina Donovan, PA-C  potassium chloride (K-DUR) 10 MEQ tablet Take 1 tablet (10 mEq total) by mouth daily. Patient not taking: Reported on 02/19/2016 10/15/14 11/10/18  Rosemarie Ax, MD    Current Outpatient Medications  Medication Sig Dispense Refill   atorvastatin (LIPITOR) 40 MG tablet Take 1 tablet (40 mg total) by mouth daily. 90 tablet 3   clonazePAM (KLONOPIN) 0.5 MG tablet Take 1 tablet (0.5 mg total) by mouth 2 (two) times daily as needed. for anxiety 60 tablet 2   cyclobenzaprine (FLEXERIL) 10 MG tablet Take 1 tablet (10 mg total) by mouth 2 (two) times daily as needed for muscle spasms as needed for muscle pain 60 tablet 6   divalproex (DEPAKOTE ER) 500 MG 24 hr tablet Take 1 tablet (500 mg total) by mouth at bedtime. 30 tablet 6   gabapentin (NEURONTIN) 300 MG capsule Take 1 capsule (300 mg total) by mouth 3 (three) times daily. 90 capsule 6   glipiZIDE (GLUCOTROL) 5 MG tablet TAKE 0.5 TABLETS (2.5 MG TOTAL) BY MOUTH 2 (TWO) TIMES DAILY BEFORE A MEAL. (Patient taking differently: Take 2.5 mg by mouth 2 (two) times daily before a meal.) 30 tablet 3   risperiDONE (RISPERDAL) 1 MG tablet Take 1 tablet (1 mg total) by mouth 2 (two) times daily. 60 tablet 3   sertraline (ZOLOFT) 100 MG tablet TAKE 2 TABLETS BY MOUTH ONCE A DAY 60 tablet 3   SUMAtriptan (IMITREX) 50 MG tablet Take 1 tablet (50 mg total) by mouth every 2 (two) hours as needed for migraine. May repeat in 2 hours if headache persists or recurs. 12 tablet 6   topiramate (TOPAMAX) 50 MG tablet Take 1 tablet (50 mg total) by mouth 2 (two)  times daily. 60 tablet 3   traZODone (DESYREL) 100 MG tablet TAKE 2 TABLETS BY MOUTH AT BEDTIME 60 tablet 3   Vitamin D, Ergocalciferol, (DRISDOL) 1.25 MG (50000 UNIT) CAPS capsule Take 1 capsule (50,000 Units total) by mouth every 7 (seven) days. 7 capsule 0   acetaminophen-codeine (TYLENOL #3) 300-30 MG tablet Take 1 tablet by mouth every 8 (eight) hours as needed for moderate pain. 90 tablet 0   albuterol (VENTOLIN HFA) 108 (90 Base) MCG/ACT inhaler INHALE 1-2 PUFFS INTO THE LUNGS EVERY 6 (SIX) HOURS AS NEEDED FOR WHEEZING OR SHORTNESS OF BREATH. (Patient taking differently: Inhale 2 puffs into the lungs every 6 (six) hours as needed for wheezing or shortness of breath.)  18 g 1   Blood Glucose Monitoring Suppl (TRUE METRIX METER) w/Device KIT 1 each by Does not apply route in the morning and at bedtime. 1 kit 0   dapagliflozin propanediol (FARXIGA) 10 MG TABS tablet Take by mouth daily.     fluticasone (CUTIVATE) 0.005 % ointment Apply 1 application topically 2 (two) times daily.     glucose blood (TRUE METRIX BLOOD GLUCOSE TEST) test strip Use as instructed 100 each 12   ibuprofen (ADVIL) 200 MG tablet Take 200 mg by mouth every 6 (six) hours as needed for headache, mild pain or moderate pain.     lidocaine (LIDODERM) 5 % Place 2 patches onto the skin daily. Remove & Discard patch within 12 hours or as directed by MD 60 patch 11   TRUEplus Lancets 28G MISC Use as directed in the morning and at bedtime. 100 each 1   Current Facility-Administered Medications  Medication Dose Route Frequency Provider Last Rate Last Admin   0.9 %  sodium chloride infusion  500 mL Intravenous Once Sharyn Creamer, MD        Allergies as of 05/20/2021   (No Known Allergies)    Family History  Problem Relation Age of Onset   Stomach cancer Mother    Heart attack Father    Breast cancer Neg Hx    Colon cancer Neg Hx    Colon polyps Neg Hx     Social History   Socioeconomic History   Marital status:  Single    Spouse name: Not on file   Number of children: 1   Years of education: Not on file   Highest education level: Some college, no degree  Occupational History   Not on file  Tobacco Use   Smoking status: Every Day    Packs/day: 0.50    Types: Cigarettes   Smokeless tobacco: Never  Vaping Use   Vaping Use: Never used  Substance and Sexual Activity   Alcohol use: No   Drug use: No   Sexual activity: Not Currently  Other Topics Concern   Not on file  Social History Narrative   Not on file   Social Determinants of Health   Financial Resource Strain: Not on file  Food Insecurity: Food Insecurity Present   Worried About [place] in the Last Year: Often true   Ran Out of Food in the Last Year: Sometimes true  Transportation Needs: No Transportation Needs   Lack of Transportation (Medical): No   Lack of Transportation (Non-Medical): No  Physical Activity: Not on file  Stress: Not on file  Social Connections: Not on file  Intimate Partner Violence: Not on file    Physical Exam: Vital signs in last 24 hours: BP (!) 151/61    Pulse 94    Temp (!) 97.5 F (36.4 C)    Ht $R'5\' 4"'ZY$  (1.626 m)    Wt 232 lb (105.2 kg)    SpO2 94%    BMI 39.82 kg/m  GEN: NAD EYE: Sclerae anicteric ENT: MMM CV: Non-tachycardic Pulm: No increased work of breathing GI: Soft, NT/ND NEURO:  Alert & Oriented   [name], MD [facility]  05/20/2021 10:55 AM

## 2021-05-20 NOTE — Progress Notes (Signed)
Report given to PACU, vss 

## 2021-05-20 NOTE — Patient Instructions (Signed)
Discharge instructions given. Handouts on polyps,diverticulosis and hemorrhoids. Resume previous medications. YOU HAD AN ENDOSCOPIC PROCEDURE TODAY AT [facility]:   Refer to the procedure report that was given to you for any specific questions about what was found during the examination.  If the procedure report does not answer your questions, please call your gastroenterologist to clarify.  If you requested that your care partner not be given the details of your procedure findings, then the procedure report has been included in a sealed envelope for you to review at your convenience later.  YOU SHOULD EXPECT: Some feelings of bloating in the abdomen. Passage of more gas than usual.  Walking can help get rid of the air that was put into your GI tract during the procedure and reduce the bloating. If you had a lower endoscopy (such as a colonoscopy or flexible sigmoidoscopy) you may notice spotting of blood in your stool or on the toilet paper. If you underwent a bowel prep for your procedure, you may not have a normal bowel movement for a few days.  Please Note:  You might notice some irritation and congestion in your nose or some drainage.  This is from the oxygen used during your procedure.  There is no need for concern and it should clear up in a day or so.  SYMPTOMS TO REPORT IMMEDIATELY:  Following lower endoscopy (colonoscopy or flexible sigmoidoscopy):  Excessive amounts of blood in the stool  Significant tenderness or worsening of abdominal pains  Swelling of the abdomen that is new, acute  Fever of 100F or higher   For urgent or emergent issues, a gastroenterologist can be reached at any hour by calling [phone]. Do not use MyChart messaging for urgent concerns.    DIET:  We do recommend a small meal at first, but then you may proceed to your regular diet.  Drink plenty of fluids but you should avoid alcoholic beverages for 24 hours.  ACTIVITY:  You should  plan to take it easy for the rest of today and you should NOT DRIVE or use heavy machinery until tomorrow (because of the sedation medicines used during the test).    FOLLOW UP: Our staff will call the number listed on your records 48-72 hours following your procedure to check on you and address any questions or concerns that you may have regarding the information given to you following your procedure. If we do not reach you, we will leave a message.  We will attempt to reach you two times.  During this call, we will ask if you have developed any symptoms of COVID 19. If you develop any symptoms (ie: fever, flu-like symptoms, shortness of breath, cough etc.) before then, please call [phone].  If you test positive for Covid 19 in the 2 weeks post procedure, please call and report this information to [place].    If any biopsies were taken you will be contacted by phone or by letter within the next 1-3 weeks.  Please call us at [phone] if you have not heard about the biopsies in 3 weeks.    SIGNATURES/CONFIDENTIALITY: You and/or your care partner have signed paperwork which will be entered into your electronic medical record.  These signatures attest to the fact that that the information above on your After Visit Summary has been reviewed and is understood.  Full responsibility of the confidentiality of this discharge information lies with you and/or your care-partner.

## 2021-05-21 ENCOUNTER — Encounter (HOSPITAL_COMMUNITY): Payer: Self-pay | Admitting: Psychiatry

## 2021-05-21 ENCOUNTER — Ambulatory Visit (HOSPITAL_BASED_OUTPATIENT_CLINIC_OR_DEPARTMENT_OTHER): Payer: Self-pay | Admitting: Physical Therapy

## 2021-05-21 ENCOUNTER — Telehealth (INDEPENDENT_AMBULATORY_CARE_PROVIDER_SITE_OTHER): Payer: No Payment, Other | Admitting: Psychiatry

## 2021-05-21 ENCOUNTER — Other Ambulatory Visit: Payer: Self-pay

## 2021-05-21 ENCOUNTER — Encounter (HOSPITAL_BASED_OUTPATIENT_CLINIC_OR_DEPARTMENT_OTHER): Payer: Self-pay

## 2021-05-21 DIAGNOSIS — F411 Generalized anxiety disorder: Secondary | ICD-10-CM

## 2021-05-21 DIAGNOSIS — F3181 Bipolar II disorder: Secondary | ICD-10-CM | POA: Diagnosis not present

## 2021-05-21 MED ORDER — DIVALPROEX SODIUM ER 500 MG PO TB24
500.0000 mg | ORAL_TABLET | Freq: Every evening | ORAL | 6 refills | Status: AC
  Filled 2021-05-21: qty 30, 30d supply, fill #0
  Filled 2021-06-25: qty 30, 30d supply, fill #1

## 2021-05-21 MED ORDER — RISPERIDONE 1 MG PO TABS
1.0000 mg | ORAL_TABLET | Freq: Two times a day (BID) | ORAL | 3 refills | Status: DC
  Filled 2021-05-21: qty 60, 30d supply, fill #0
  Filled 2021-06-25: qty 60, 30d supply, fill #1
  Filled 2021-08-05 – 2021-08-13 (×2): qty 60, 30d supply, fill #2
  Filled 2021-09-26: qty 60, 30d supply, fill #3

## 2021-05-21 MED ORDER — TRAZODONE HCL 100 MG PO TABS
ORAL_TABLET | Freq: Every day | ORAL | 3 refills | Status: DC
  Filled 2021-05-21: qty 60, 30d supply, fill #0
  Filled 2021-06-25: qty 60, 30d supply, fill #1
  Filled 2021-08-05 – 2021-08-14 (×3): qty 60, 30d supply, fill #2
  Filled 2021-09-26 – 2021-09-27 (×2): qty 60, 30d supply, fill #3

## 2021-05-21 MED ORDER — SERTRALINE HCL 100 MG PO TABS
ORAL_TABLET | Freq: Every day | ORAL | 3 refills | Status: DC
  Filled 2021-05-21: qty 60, 30d supply, fill #0
  Filled 2021-06-25: qty 60, 30d supply, fill #1
  Filled 2021-08-05 – 2021-08-14 (×3): qty 60, 30d supply, fill #2
  Filled 2021-09-26: qty 60, 30d supply, fill #3

## 2021-05-21 NOTE — Progress Notes (Signed)
BH MD/PA/NP OP Progress Note Virtual Visit via Video Note  I connected with Karen Whitehead on 05/21/21 at 10:30 AM EST by a video enabled telemedicine application and verified that I am speaking with the correct person using two identifiers.  Location: Patient: Home Provider: Home office   I discussed the limitations of evaluation and management by telemedicine and the availability of in person appointments. The patient expressed understanding and agreed to proceed.  I provided 30 minutes of non-face-to-face time during this encounter.   05/21/2021 11:04 AM Karen Whitehead  MRN:  [id number]  Chief Complaint: "I am not to good"  HPI: 56 year old female seen today for follow-up psychiatric evaluation.   She has a psychiatric history of anxiety and depression.  She is currently managed on Zoloft 200 mg daily, trazodone 200 mg nightly,  Risperdal 1 mg twice daily (patient notes she has only been taking it a week), Klonopin 0.5 mg 2 times daily as needed, and gabapentin 300 mg TID (which she receives from her PCP). She notes that her medications are somewhat effective in managing her psychiatric conditions.   Today she is well groomed, pleasant, cooperative, engaged in conversation, and maintained eye contact. She notes that she has not been doing well due to physical pain and having increased anxiety. She reports that she is trying to manage her anxiety with her reduced dose of Klonopin. She notes that she recently got overwhelmed when told by her pain specialist that her appointment would have to be moved back a month. She reports that Risperdal has been more effective than Seroquel with managing her mood, sleep, and anxiety. Today provider conducted a GAD 7 and patient scored a 17, at her last visit she scored a 16. Provider also conducted a PHQ 9 and patient scored a 21, at her last visit she scored a 23. She notes that her appetite has been decreased but denies weight loss. She reports sleeping 6  hours nightly. Patient endorses passive SI but denies wanting to harm herself. She notes that she has to live for her son. Today she denies SI/HI/VAH, mania, or paranoia.   Patient notes her pain in her back is managed with lidocaine injection, tylenol, and gabapentin. She notes that since having her injection her pain is at a 6/10.  She also notes that she continues to go to water therapy and is looking forward to attending it today.    No medications changes made today. She will continue all medications as prescribed and follow up with outpatient counseling for therapy.Klonopin not filled today as it was recently filled. No other concerns noted at this time.     ICD-10-CM   1. Bipolar 2 disorder, major depressive episode (HCC)  F31.81 traZODone (DESYREL) 100 MG tablet    sertraline (ZOLOFT) 100 MG tablet    risperiDONE (RISPERDAL) 1 MG tablet    divalproex (DEPAKOTE ER) 500 MG 24 hr tablet    2. Generalized anxiety disorder  F41.1 traZODone (DESYREL) 100 MG tablet    sertraline (ZOLOFT) 100 MG tablet      Past Psychiatric History: MDD, anxiety  Past Medical History:  Past Medical History:  Diagnosis Date   Anxiety    Arthritis    Chronic kidney disease    Depression    Diabetes mellitus without complication ([place])    Granuloma annulare    Hyperlipidemia     Past Surgical History:  Procedure Laterality Date   APPENDECTOMY      Family Psychiatric History:  Denies  Family History:  Family History  Problem Relation Age of Onset   Stomach cancer Mother    Heart attack Father    Breast cancer Neg Hx    Colon cancer Neg Hx    Colon polyps Neg Hx     Social History:  Social History   Socioeconomic History   Marital status: Single    Spouse name: Not on file   Number of children: 1   Years of education: Not on file   Highest education level: Some college, no degree  Occupational History   Not on file  Tobacco Use   Smoking status: Every Day    Packs/day: 0.50     Types: Cigarettes   Smokeless tobacco: Never  Vaping Use   Vaping Use: Never used  Substance and Sexual Activity   Alcohol use: No   Drug use: No   Sexual activity: Not Currently  Other Topics Concern   Not on file  Social History Narrative   Not on file   Social Determinants of Health   Financial Resource Strain: Not on file  Food Insecurity: Food Insecurity Present   Worried About [place] in the Last Year: Often true   Ran Out of Food in the Last Year: Sometimes true  Transportation Needs: No Transportation Needs   Lack of Transportation (Medical): No   Lack of Transportation (Non-Medical): No  Physical Activity: Not on file  Stress: Not on file  Social Connections: Not on file    Allergies: No Known Allergies  Metabolic Disorder Labs: Lab Results  Component Value Date   HGBA1C 6.4 10/31/2020   No results found for: PROLACTIN Lab Results  Component Value Date   CHOL 282 (H) 10/31/2020   TRIG 257 (H) 10/31/2020   HDL 47 10/31/2020   CHOLHDL 6.0 (H) 10/31/2020   LDLCALC 185 (H) 10/31/2020   Lab Results  Component Value Date   TSH 1.050 12/21/2020    Therapeutic Level Labs: No results found for: LITHIUM No results found for: VALPROATE No components found for:  CBMZ  Current Medications: Current Outpatient Medications  Medication Sig Dispense Refill   acetaminophen-codeine (TYLENOL #3) 300-30 MG tablet Take 1 tablet by mouth every 8 (eight) hours as needed for moderate pain. 90 tablet 0   albuterol (VENTOLIN HFA) 108 (90 Base) MCG/ACT inhaler INHALE 1-2 PUFFS INTO THE LUNGS EVERY 6 (SIX) HOURS AS NEEDED FOR WHEEZING OR SHORTNESS OF BREATH. (Patient taking differently: Inhale 2 puffs into the lungs every 6 (six) hours as needed for wheezing or shortness of breath.) 18 g 1   atorvastatin (LIPITOR) 40 MG tablet Take 1 tablet (40 mg total) by mouth daily. 90 tablet 3   Blood Glucose Monitoring Suppl (TRUE METRIX METER) w/Device KIT 1 each by Does not  apply route in the morning and at bedtime. 1 kit 0   clonazePAM (KLONOPIN) 0.5 MG tablet Take 1 tablet (0.5 mg total) by mouth 2 (two) times daily as needed. for anxiety 60 tablet 2   cyclobenzaprine (FLEXERIL) 10 MG tablet Take 1 tablet (10 mg total) by mouth 2 (two) times daily as needed for muscle spasms as needed for muscle pain 60 tablet 6   dapagliflozin propanediol (FARXIGA) 10 MG TABS tablet Take by mouth daily.     divalproex (DEPAKOTE ER) 500 MG 24 hr tablet Take 1 tablet (500 mg total) by mouth at bedtime. 30 tablet 6   fluticasone (CUTIVATE) 0.005 % ointment Apply 1 application topically 2 (  two) times daily.     gabapentin (NEURONTIN) 300 MG capsule Take 1 capsule (300 mg total) by mouth 3 (three) times daily. 90 capsule 6   glipiZIDE (GLUCOTROL) 5 MG tablet TAKE 0.5 TABLETS (2.5 MG TOTAL) BY MOUTH 2 (TWO) TIMES DAILY BEFORE A MEAL. (Patient taking differently: Take 2.5 mg by mouth 2 (two) times daily before a meal.) 30 tablet 3   glucose blood (TRUE METRIX BLOOD GLUCOSE TEST) test strip Use as instructed 100 each 12   ibuprofen (ADVIL) 200 MG tablet Take 200 mg by mouth every 6 (six) hours as needed for headache, mild pain or moderate pain.     lidocaine (LIDODERM) 5 % Place 2 patches onto the skin daily. Remove & Discard patch within 12 hours or as directed by MD 60 patch 11   risperiDONE (RISPERDAL) 1 MG tablet Take 1 tablet (1 mg total) by mouth 2 (two) times daily. 60 tablet 3   sertraline (ZOLOFT) 100 MG tablet TAKE 2 TABLETS BY MOUTH ONCE A DAY 60 tablet 3   SUMAtriptan (IMITREX) 50 MG tablet Take 1 tablet (50 mg total) by mouth every 2 (two) hours as needed for migraine. May repeat in 2 hours if headache persists or recurs. 12 tablet 6   topiramate (TOPAMAX) 50 MG tablet Take 1 tablet (50 mg total) by mouth 2 (two) times daily. 60 tablet 3   traZODone (DESYREL) 100 MG tablet TAKE 2 TABLETS BY MOUTH AT BEDTIME 60 tablet 3   TRUEplus Lancets 28G MISC Use as directed in the morning  and at bedtime. 100 each 1   Vitamin D, Ergocalciferol, (DRISDOL) 1.25 MG (50000 UNIT) CAPS capsule Take 1 capsule (50,000 Units total) by mouth every 7 (seven) days. 7 capsule 0   No current facility-administered medications for this visit.     Musculoskeletal: Strength & Muscle Tone: decreased and Telehealth visit Gait & Station: unsteady, Ambulates with walker, telehealth visiit Patient leans: N/A  Psychiatric Specialty Exam: Review of Systems  There were no vitals taken for this visit.There is no height or weight on file to calculate BMI.  General Appearance: Well Groomed  Eye Contact:  Good  Speech:  Clear and Coherent and Slow  Volume:  Normal  Mood:  Anxious and Depressed, due to pain  Affect:  Appropriate and Congruent  Thought Process:  Coherent, Goal Directed, and Linear  Orientation:  Full (Time, Place, and Person)  Thought Content: WDL and Logical   Suicidal Thoughts:  Yes.  without intent/plan  Homicidal Thoughts:  No  Memory:  Immediate;   Good Recent;   Good Remote;   Good  Judgement:  Good  Insight:  Good  Psychomotor Activity:  Decreased  Concentration:  Concentration: Good and Attention Span: Good  Recall:  Good  Fund of Knowledge: Good  Language: Good  Akathisia:  No  Handed:  Right  AIMS (if indicated): not done  Assets:  Communication Skills Desire for Improvement Financial Resources/Insurance Housing Leisure Time Social Support  ADL's:  Intact  Cognition: WNL  Sleep:  Good   Screenings: GAD-7    Flowsheet Row Video Visit from 05/21/2021 in [facility] Video Visit from 04/16/2021 in [facility] Office Visit from 03/14/2021 in [place] [facility] [place] [facility] Video Visit from 01/09/2021 in [facility] Office Visit from 12/17/2020 in [place]  Total GAD-7 Score _0 21  Mini-Mental     Flowsheet Row Office Visit from 12/21/2020 in [facility]  Total Score (max 30 points ) 22      PHQ2-9    Flowsheet Row Video Visit from 05/21/2021 in [facility] Video Visit from 04/16/2021 in [facility] Office Visit from 03/14/2021 in [facility] [place] [facility] Office Visit from 03/08/2021 in [facility] Video Visit from 01/09/2021 in [facility]  PHQ-2 Total Score _0 PHQ-9 Total Score _1 -- 22      Flowsheet Row Video Visit from 05/21/2021 in [facility] Video Visit from 04/16/2021 in [facility] ED from 03/18/2021 in [place] Error: Q7 should not be populated when Q6 is No Error: Q7 should not be populated when Q6 is No No Risk        Assessment and Plan: Patient notes that she is anxious and depressed due to pain however notes that things have improved since her last visit. No medication changes made today.  Patient agreeable to continue medication as prescribed.  Klonopin not filled today as it was recently filled.  1. Bipolar 2 disorder, major depressive episode (HCC)  Continue- divalproex (DEPAKOTE ER) 500 MG 24 hr tablet; Take 1 tablet (500 mg total) by mouth at bedtime.  Dispense: 30 tablet; Refill: 6 Continue- risperiDONE (RISPERDAL) 1 MG tablet; Take 1 tablet (1 mg total) by mouth 2 (two) times daily.  Dispense: 60 tablet; Refill: 3 Continue- sertraline (ZOLOFT) 100 MG tablet; TAKE 2 TABLETS BY MOUTH ONCE A DAY  Dispense: 60 tablet; Refill: 3 Continue- traZODone (DESYREL) 100 MG tablet; TAKE 2 TABLETS BY MOUTH AT BEDTIME  Dispense: 60 tablet; Refill: 3  2. Generalized anxiety disorder  Continue- sertraline (ZOLOFT) 100 MG tablet; TAKE 2 TABLETS BY MOUTH ONCE A DAY   Dispense: 60 tablet; Refill: 3 Continue- traZODone (DESYREL) 100 MG tablet; TAKE 2 TABLETS BY MOUTH AT BEDTIME  Dispense: 60 tablet; Refill: 3   Follow up in 3 months Follow up with therapy  Salley Slaughter, NP 05/21/2021, 11:04 AM

## 2021-05-22 ENCOUNTER — Ambulatory Visit: Payer: Self-pay | Admitting: Family

## 2021-05-22 ENCOUNTER — Other Ambulatory Visit: Payer: Self-pay

## 2021-05-22 ENCOUNTER — Telehealth: Payer: Self-pay

## 2021-05-22 ENCOUNTER — Encounter: Payer: Self-pay | Admitting: Internal Medicine

## 2021-05-22 MED FILL — Glipizide Tab 5 MG: ORAL | 30 days supply | Qty: 30 | Fill #0 | Status: AC

## 2021-05-22 NOTE — Telephone Encounter (Signed)
°  Follow up Call-  Call back number 05/20/2021  Post procedure Call Back phone  # [phone]  Permission to leave phone message Yes  Some recent data might be hidden     Patient questions:  Do you have a fever, pain , or abdominal swelling? No. Pain Score  0 *  Have you tolerated food without any problems? Yes.    Have you been able to return to your normal activities? Yes.    Do you have any questions about your discharge instructions: Diet   No. Medications  No. Follow up visit  No.  Do you have questions or concerns about your Care? No.  Actions: * If pain score is 4 or above: No action needed, pain <4.

## 2021-05-23 ENCOUNTER — Telehealth: Payer: Self-pay | Admitting: *Deleted

## 2021-05-23 ENCOUNTER — Other Ambulatory Visit: Payer: Self-pay

## 2021-05-23 LAB — DRUG TOX MONITOR 1 W/CONF, ORAL FLD
Amphetamines: NEGATIVE ng/mL (ref <10)
Barbiturates: NEGATIVE ng/mL (ref <10)
Benzodiazepines: NEGATIVE ng/mL (ref <0.50)
Buprenorphine: NEGATIVE ng/mL (ref <0.10)
Cocaine: NEGATIVE ng/mL (ref <5.0)
Cotinine: 5.1 ng/mL — ABNORMAL HIGH (ref <5.0)
Fentanyl: NEGATIVE ng/mL (ref <0.10)
Heroin Metabolite: NEGATIVE ng/mL (ref <1.0)
MARIJUANA: NEGATIVE ng/mL (ref <2.5)
MDMA: NEGATIVE ng/mL (ref <10)
Meprobamate: NEGATIVE ng/mL (ref <2.5)
Methadone: NEGATIVE ng/mL (ref <5.0)
Nicotine Metabolite: POSITIVE ng/mL — AB (ref <5.0)
Opiates: NEGATIVE ng/mL (ref <2.5)
Phencyclidine: NEGATIVE ng/mL (ref <10)
Tapentadol: NEGATIVE ng/mL (ref <5.0)
Tramadol: NEGATIVE ng/mL (ref <5.0)
Zolpidem: NEGATIVE ng/mL (ref <5.0)

## 2021-05-23 LAB — DRUG TOX ALC METAB W/CON, ORAL FLD: Alcohol Metabolite: NEGATIVE ng/mL (ref <25)

## 2021-05-23 NOTE — Telephone Encounter (Signed)
Oral drug screen negative for unprescribed medication medications.

## 2021-05-24 ENCOUNTER — Ambulatory Visit (HOSPITAL_BASED_OUTPATIENT_CLINIC_OR_DEPARTMENT_OTHER): Payer: Self-pay | Admitting: Physical Therapy

## 2021-05-24 ENCOUNTER — Encounter (HOSPITAL_BASED_OUTPATIENT_CLINIC_OR_DEPARTMENT_OTHER): Payer: Self-pay

## 2021-05-27 ENCOUNTER — Telehealth: Payer: Self-pay | Admitting: *Deleted

## 2021-05-27 ENCOUNTER — Other Ambulatory Visit: Payer: Self-pay | Admitting: Physical Medicine and Rehabilitation

## 2021-05-27 ENCOUNTER — Other Ambulatory Visit: Payer: Self-pay

## 2021-05-27 MED ORDER — HYDROCODONE-ACETAMINOPHEN 5-325 MG PO TABS
1.0000 | ORAL_TABLET | Freq: Every day | ORAL | 0 refills | Status: DC | PRN
  Filled 2021-05-27: qty 30, 30d supply, fill #0

## 2021-05-27 NOTE — Telephone Encounter (Signed)
Karen Whitehead called to say her back is unchanged and the Tylenol #3 is not touching  it. She is asking for something stronger.

## 2021-05-28 ENCOUNTER — Ambulatory Visit (HOSPITAL_BASED_OUTPATIENT_CLINIC_OR_DEPARTMENT_OTHER): Payer: Self-pay | Admitting: Physical Therapy

## 2021-05-28 ENCOUNTER — Other Ambulatory Visit: Payer: Self-pay

## 2021-05-28 ENCOUNTER — Other Ambulatory Visit: Payer: Self-pay | Admitting: Physical Medicine and Rehabilitation

## 2021-05-28 DIAGNOSIS — M25562 Pain in left knee: Secondary | ICD-10-CM

## 2021-05-28 DIAGNOSIS — M545 Low back pain, unspecified: Secondary | ICD-10-CM

## 2021-05-28 DIAGNOSIS — R262 Difficulty in walking, not elsewhere classified: Secondary | ICD-10-CM

## 2021-05-28 DIAGNOSIS — R296 Repeated falls: Secondary | ICD-10-CM

## 2021-05-28 DIAGNOSIS — R278 Other lack of coordination: Secondary | ICD-10-CM

## 2021-05-28 DIAGNOSIS — G8929 Other chronic pain: Secondary | ICD-10-CM

## 2021-05-28 DIAGNOSIS — M6281 Muscle weakness (generalized): Secondary | ICD-10-CM

## 2021-05-28 MED ORDER — HYDROCODONE-ACETAMINOPHEN 5-325 MG PO TABS: 1.0000 | ORAL_TABLET | Freq: Two times a day (BID) | ORAL | 0 refills | Status: DC | PRN

## 2021-05-28 NOTE — Telephone Encounter (Signed)
Mrs Hechavarria notified medication sent to a pharmacy she can use [facility]).

## 2021-05-28 NOTE — Therapy (Addendum)
[facility] [address] [place], [place], [address] Phone: [phone]   Fax:  [phone]  Physical Therapy Treatment  Patient Details  Name: Karen Whitehead MRN: [id number] Date of Birth: [birth year] Referring Provider (PT): Dr. Hoy Register   Encounter Date: 05/28/2021   PT End of Session - 05/28/21 1544     Visit Number 16             Past Medical History:  Diagnosis Date   Anxiety    Arthritis    Chronic kidney disease    Depression    Diabetes mellitus without complication (HCC)    Granuloma annulare    Hyperlipidemia     Past Surgical History:  Procedure Laterality Date   APPENDECTOMY      There were no vitals filed for this visit.   Subjective Assessment - 05/28/21 1546     Subjective Pt reports she recently received 9 injections from her neck to her low back.  Pt states she is feeling better in her upper/mid back but not in her lower back.  Pt states the pool water is too cold and hurts her joints.    Pertinent History Fall Risk.  Granuloma Annulare, major depressive disoder, legionnairre's disease, dengue fever, DM, memory loss, anxiety.                                          PT Short Term Goals - 04/30/21 2208       PT SHORT TERM GOAL #1   Title Pt will be able to perform sit to stand transfers safely and independently.    Time 6    Period Weeks    Status On-going      PT SHORT TERM GOAL #2   Title Pt will report at least a 25% improvement overall in pain and mobility.    Time 4    Period Weeks      PT SHORT TERM GOAL #3   Title Pt will ambulate at least 100 ft with improved gait speed and correct usage of walker.    Baseline slow gait speed; improved usage of walker    Time 4    Period Weeks    Status On-going   improved usage of walker     PT SHORT TERM GOAL #4   Title Pt will  improve TUG time to no > and 20 seconds with walker.    Time 5     Period Weeks    Status Achieved      PT SHORT TERM GOAL #5   Title Pt will demo improved LE strength to 4- to 4/5 MMT in hip flexion, knee flex and extension, and DF bilat for improved mobility, ambulation, and tolerance to activity.    Time 6    Period Weeks    Status Not Met   improved L LE strength     PT SHORT TERM GOAL #6   Title Pt will report improved standing duration at home to improve performance of self care activities and light IADLs.    Time 4    Period Weeks    Status Not Met               PT Long Term Goals - 04/30/21 2213       PT LONG TERM GOAL #1   Title Pt will ambulate at  least 200 ft with walker with no > than SBA/CGA without having to sit down    Time 6    Period Weeks    Status Partially Met      PT LONG TERM GOAL #2   Title Pt will tolerate aquatic therapy without significant increased pain in order to establish an exercise routine to improve functional mobility and tolerance to activity.    Time 6    Period Weeks    Status On-going   progressing     PT LONG TERM GOAL #3   Title Pt will be able to perform TUG in no > than 20 sec for improved mobility and gait speed.    Time 6    Period Weeks    Status Not Met      PT LONG TERM GOAL #4   Title Pt will be able to ambulate community distance with walker without significant pain or difficulty.    Time 6    Period Weeks    Status Not Met      PT LONG TERM GOAL #5   Title Pt will report she is able to perform bed mobility, self care activities, and some light IADLs in her home without signifiant back and LE pain.    Time 6    Period Weeks    Status Not Met      PT LONG TERM GOAL #6   Title Pt will be able to perform aquatic program with appropriate assistance without increased pain for improved function, mobility, balance, strength, and activity tolerance.    Time 6    Period Weeks    Status On-going      PT LONG TERM GOAL #7   Title Pt will report she is able to perform stairs at  home safely without assistance to enter/exit home.    Time 6    Period Weeks                   Plan - 05/28/21 1548     Clinical Impression Statement Pt entered pool though stopped on the last step due to increased pain with cooler water temperature.  The Therapy pool temperature has been decreased due to inspection.  The temp is 87 deg.  Pt states the water is too cold for her.  She reports having increased foot and knee pain when entering the pool.  PT deferred Rx due to the cooler water temperature and pt not feeling comfortable and actually having increased pain when entering the pool.  Pt was able to ambulate with rollator to and from the pool today    PT Next Visit Plan Will cancel next appt this week and plan for land Rx the week after.    Consulted and Agree with Plan of Care Patient             Patient will benefit from skilled therapeutic intervention in order to improve the following deficits and impairments:     Visit Diagnosis: Chronic low back pain, unspecified back pain laterality, unspecified whether sciatica present  Muscle weakness (generalized)  Difficulty in walking, not elsewhere classified  Chronic pain of both knees  Other lack of coordination  Repeated falls     Problem List Patient Active Problem List   Diagnosis Date Noted   LGSIL on Pap smear of cervix 03/14/2021   Vitamin D deficiency 02/20/2021   Weakness 12/21/2020   Complaints of total body pain 12/21/2020   Chronic migraine w/o aura w/o  status migrainosus, not intractable 12/21/2020   Bilateral low back pain with bilateral sciatica 12/21/2020   Major depressive disorder, recurrent episode with anxious distress (HCC) 03/14/2020   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 [place],  [place], [address] Phone: [phone]   Fax:  [phone]  Name: Karen Whitehead MRN: [id number] Date of Birth: 1965-05-21   PHYSICAL THERAPY DISCHARGE SUMMARY  Visits from Start of Care: 16  Current functional level related to goals / functional outcomes: Pt continued to have significant back pain and significant functional limitations.   Remaining deficits: See above    Education / Equipment: See above   Patient was seen in PT from 11/19/20 - 05/28/2021.  Pt arrived to Rx on 05/28/2021 though was unable to perform aquatic exercises due to the pool being too cold for her.  She had increased pain when entering pool.  Pt cancelled her following appointment due to pain.  Unable to assess current functional status or goals due to pt not being present at discharge.  Pt will be considered discharged at this time due to not calling back and scheduling any further PT.    Audie Clear III PT, DPT 09/02/21 8:44 AM

## 2021-05-30 ENCOUNTER — Ambulatory Visit (HOSPITAL_BASED_OUTPATIENT_CLINIC_OR_DEPARTMENT_OTHER): Payer: Self-pay | Admitting: Physical Therapy

## 2021-05-31 ENCOUNTER — Ambulatory Visit (HOSPITAL_COMMUNITY): Payer: No Payment, Other | Admitting: Licensed Clinical Social Worker

## 2021-06-04 ENCOUNTER — Ambulatory Visit (HOSPITAL_BASED_OUTPATIENT_CLINIC_OR_DEPARTMENT_OTHER): Payer: Self-pay | Admitting: Physical Therapy

## 2021-06-04 ENCOUNTER — Encounter (HOSPITAL_BASED_OUTPATIENT_CLINIC_OR_DEPARTMENT_OTHER): Payer: Self-pay

## 2021-06-06 ENCOUNTER — Ambulatory Visit (HOSPITAL_BASED_OUTPATIENT_CLINIC_OR_DEPARTMENT_OTHER): Payer: Self-pay | Admitting: Physical Therapy

## 2021-06-06 ENCOUNTER — Other Ambulatory Visit: Payer: Self-pay

## 2021-06-06 ENCOUNTER — Encounter (HOSPITAL_BASED_OUTPATIENT_CLINIC_OR_DEPARTMENT_OTHER): Payer: Self-pay

## 2021-06-07 ENCOUNTER — Telehealth: Payer: Self-pay | Admitting: *Deleted

## 2021-06-07 NOTE — Telephone Encounter (Signed)
Karen Whitehead called to say we have cancelled several of her appts with Dr Ranell Patrick and her pain medication is not enough and not helping. Per Dr Ranell Patrick in an earlier message, she is to be set up with Karen Whitehead to be seen monthly and she has the 07/25/21 appt with Dr Ranell Patrick. I have let her know we can not change her med until she is seen. [name] is scheduling her with first available with Karen Whitehead.

## 2021-06-10 ENCOUNTER — Encounter: Payer: Self-pay | Attending: Physical Medicine and Rehabilitation | Admitting: Registered Nurse

## 2021-06-10 ENCOUNTER — Other Ambulatory Visit: Payer: Self-pay

## 2021-06-10 ENCOUNTER — Encounter: Payer: Self-pay | Admitting: Registered Nurse

## 2021-06-10 VITALS — BP 131/88 | HR 77 | Ht 64.0 in | Wt 226.0 lb

## 2021-06-10 DIAGNOSIS — Z79899 Other long term (current) drug therapy: Secondary | ICD-10-CM | POA: Insufficient documentation

## 2021-06-10 DIAGNOSIS — M546 Pain in thoracic spine: Secondary | ICD-10-CM | POA: Insufficient documentation

## 2021-06-10 DIAGNOSIS — G894 Chronic pain syndrome: Secondary | ICD-10-CM | POA: Insufficient documentation

## 2021-06-10 DIAGNOSIS — M778 Other enthesopathies, not elsewhere classified: Secondary | ICD-10-CM | POA: Insufficient documentation

## 2021-06-10 DIAGNOSIS — M47817 Spondylosis without myelopathy or radiculopathy, lumbosacral region: Secondary | ICD-10-CM | POA: Insufficient documentation

## 2021-06-10 DIAGNOSIS — G8929 Other chronic pain: Secondary | ICD-10-CM | POA: Insufficient documentation

## 2021-06-10 DIAGNOSIS — Z5181 Encounter for therapeutic drug level monitoring: Secondary | ICD-10-CM | POA: Insufficient documentation

## 2021-06-10 DIAGNOSIS — M791 Myalgia, unspecified site: Secondary | ICD-10-CM | POA: Insufficient documentation

## 2021-06-10 DIAGNOSIS — M542 Cervicalgia: Secondary | ICD-10-CM | POA: Insufficient documentation

## 2021-06-10 NOTE — Progress Notes (Signed)
Subjective:    Patient ID: Karen Whitehead, female    DOB: 01-08-1966, 56 y.o.   MRN: [id number]  HPI: Karen Whitehead is a 56 y.o. female who returns for follow up appointment for chronic pain and medication refill. She states her pain is located in her neck, left shoulder, mid- lower back pain and right knee pain. She reports she is only receiving 3- 4 hours of relief of her pain with current medication regimen. She was instructed to increase her Hydrocodone to three times a day as needed for pain. Also instructed to keep a pain log, send a My-Chart message in a week with update to medication change. She verbalizes understanding. She rates her pain 9. Her current exercise regime is walking with her walker.   Ms. Upham Morphine equivalent is 10.00 MME.She  is also prescribed Clonazepam  by Eulis Canner NP.We have discussed the black box warning of using opioids and benzodiazepines. I highlighted the dangers of using these drugs together and discussed the adverse events including respiratory suppression, overdose, cognitive impairment and importance of compliance with current regimen. We will continue to monitor and adjust as indicated.  she is being closely monitored and under the care of Behavior Health.   Last Oral Swab was Performed on 05/16/2021, it was consistent.  .       Pain Inventory Average Pain 9 Pain Right Now 9 My pain is burning, stabbing, and aching  In the last 24 hours, has pain interfered with the following? General activity 10 Relation with others 10 Enjoyment of life 10 What TIME of day is your pain at its worst? morning , daytime, evening, and night Sleep (in general) Poor  Pain is worse with: bending, sitting, and standing Pain improves with: rest, heat/ice, medication, and injections Relief from Meds: 5  Family History  Problem Relation Age of Onset   Stomach cancer Mother    Heart attack Father    Breast cancer Neg Hx    Colon cancer Neg Hx    Colon  polyps Neg Hx    Social History   Socioeconomic History   Marital status: Single    Spouse name: Not on file   Number of children: 1   Years of education: Not on file   Highest education level: Some college, no degree  Occupational History   Not on file  Tobacco Use   Smoking status: Every Day    Packs/day: 0.50    Types: Cigarettes   Smokeless tobacco: Never  Vaping Use   Vaping Use: Never used  Substance and Sexual Activity   Alcohol use: No   Drug use: No   Sexual activity: Not Currently  Other Topics Concern   Not on file  Social History Narrative   Not on file   Social Determinants of Health   Financial Resource Strain: Not on file  Food Insecurity: Food Insecurity Present   Worried About Running Out of Food in the Last Year: Often true   Ran Out of Food in the Last Year: Sometimes true  Transportation Needs: No Transportation Needs   Lack of Transportation (Medical): No   Lack of Transportation (Non-Medical): No  Physical Activity: Not on file  Stress: Not on file  Social Connections: Not on file   Past Surgical History:  Procedure Laterality Date   APPENDECTOMY     Past Surgical History:  Procedure Laterality Date   APPENDECTOMY     Past Medical History:  Diagnosis Date  Anxiety    Arthritis    Chronic kidney disease    Depression    Diabetes mellitus without complication (HCC)    Granuloma annulare    Hyperlipidemia    BP 131/88    Pulse 77    Ht 5\' 4"  (1.626 m)    Wt 226 lb (102.5 kg)    SpO2 96%    BMI 38.79 kg/m   Opioid Risk Score:   Fall Risk Score:  `1  Depression screen PHQ 2/9  Depression screen [facility] 2/9 03/14/2021 03/08/2021 12/17/2020 10/31/2020 09/20/2020 02/06/2020 11/16/2019  Decreased Interest 3 1 3 3 3 3 3   Down, Depressed, Hopeless 3 1 3 3 3 3 3   PHQ - 2 Score 6 2 6 6 6 6 6   Altered sleeping 3 - 3 3 3 3 3   Tired, decreased energy 3 - 3 3 3 3 3   Change in appetite 3 - 3 3 3 3 3   Feeling bad or failure about yourself  3 - 3 3 3  3 3   Trouble concentrating 3 - 3 3 - 3 3  Moving slowly or fidgety/restless 3 - 3 3 2 1  0  Suicidal thoughts 2 - 3 2 1 2 1   PHQ-9 Score 26 - 27 26 21 24 22   Difficult doing work/chores - - - - Extremely dIfficult - -  Some encounter information is confidential and restricted. Go to Review Flowsheets activity to see all data.  Some recent data might be hidden     Review of Systems     Objective:   Physical Exam Vitals and nursing note reviewed.  Constitutional:      Appearance: Normal appearance.  Neck:     Comments: Cervical Paraspinal Tenderness: C-5-C-6  Cardiovascular:     Rate and Rhythm: Normal rate and regular rhythm.     Pulses: Normal pulses.     Heart sounds: Normal heart sounds.  Pulmonary:     Effort: Pulmonary effort is normal.     Breath sounds: Normal breath sounds.  Musculoskeletal:     Cervical back: Normal range of motion and neck supple.     Comments: Normal Muscle Bulk and Muscle Testing Reveals:  Upper Extremities:Right: Decreased  ROM 30 Degrees  and Muscle Strength 5/5 Left Upper Extremity: Decreased ROM 45 Degrees an Muscle Strength 5/5 Left AC Joint Tenderness  Lumbar Paraspinal Tenderness: L-4-L-5 Lower Extremities: Right: Decreased ROM and Muscle Strength 5/5 Right Lower Extremity Flexion Produces Pain into Popliteal Fossa Left Lower Extremity: Full ROM and Muscle Strength 5/5 Arises from Table slowly Using Walker for support Antalgic  Gait     Skin:    General: Skin is warm and dry.  Neurological:     Mental Status: She is alert and oriented to person, place, and time.  Psychiatric:        Mood and Affect: Mood normal.        Behavior: Behavior normal.         Assessment & Plan:  Cervicalgia: Continue HEP as Tolerated. Continue to alternate Heat and Ice Therapy. Continue to Monitor.  Left Shoulder Tendonitis: Continue to Alternate Ice and Heat Therapy. Continue HEP as Tolerated. Continue to Monitor.  Chronic Bilateral Thoracic Back  Pain: Continue HEP as Tolerated. Continue current medication regimen. Continue to Monitor.  Lumbosacral Spondylosis without Myelopathy : S/P Right lumbar L2, L3, L4 medial branch blocks under fluoroscopic guidance: on 03/21/2021 .Continue to Monitor.  Chronic Pain Syndrome: Increased: Hydrocodone 5mg /325 mg one tablet three  times a day as needed for pain. She will send a My-Chart message in a week with update to medication change. She verbalizes understanding.  We will continue the opioid monitoring program, this consists of regular clinic visits, examinations, urine drug screen, pill counts as well as use of [place] Controlled Substance Reporting system. A 12 month History has been reviewed on the [place] Controlled Substance Reporting System on 06/10/2021.  6. Myalgia: Continue Flexeril. Continue to Monitor.   F/U in 1 month with Dr Ranell Patrick.

## 2021-06-10 NOTE — Patient Instructions (Addendum)
Do Not Take your Clonazepam and Hydrocodone together.   It needs to be at least two hours apart.   Educated on the [employer] a Pain Log: Send a My- Chat Message on Monday 06/17/2021  You can increase your Hydrocodone to three times a day as needed for pain.

## 2021-06-21 ENCOUNTER — Telehealth: Payer: Self-pay | Admitting: Registered Nurse

## 2021-06-21 ENCOUNTER — Ambulatory Visit (INDEPENDENT_AMBULATORY_CARE_PROVIDER_SITE_OTHER): Payer: No Payment, Other | Admitting: Licensed Clinical Social Worker

## 2021-06-21 DIAGNOSIS — F339 Major depressive disorder, recurrent, unspecified: Secondary | ICD-10-CM

## 2021-06-21 MED ORDER — HYDROCODONE-ACETAMINOPHEN 7.5-325 MG PO TABS: 1.0000 | ORAL_TABLET | Freq: Four times a day (QID) | ORAL | 0 refills | Status: DC | PRN

## 2021-06-21 NOTE — Telephone Encounter (Signed)
PMP was Reviewed.  Hydrocodone e-scribed today. Karen Whitehead is aware via My-Chart Message.

## 2021-06-21 NOTE — Progress Notes (Signed)
THERAPIST PROGRESS NOTE  Virtual Visit via Video Note  I connected with Karen Whitehead on 06/21/21 at  8:00 AM EST by a video enabled telemedicine application and verified that I am speaking with the correct person using two identifiers.  Location: Patient: Home Provider: Home   I discussed the limitations of evaluation and management by telemedicine and the availability of in person appointments. The patient expressed understanding and agreed to proceed. I discussed the assessment and treatment plan with the patient. The patient was provided an opportunity to ask questions and all were answered. The patient agreed with the plan and demonstrated an understanding of the instructions.   The patient was advised to call back or seek an in-person evaluation if the symptoms worsen or if the condition fails to improve as anticipated.  I provided 42 minutes of non-face-to-face time during this encounter.  Participation Level: Active  Behavioral Response: CasualAlertAnxious and Depressed  Type of Therapy: Individual Therapy  Treatment Goals addressed: dep/anx/stressors/coping  ProgressTowards Goals: Progressing  Interventions: Solution Focused and Supportive  Summary: Karen Whitehead is a 56 y.o. female who presents with hx of dep/anx.Today pt logs on for video session. Last session 04/17/21. Pt is noted to be neatly groomed and eager for session. LCSW assessed for any significant changes since last session. Pt reports she got through the Christmas holiday ok. She states she and her son spent the day home together. Pt advises she is taking meds as prescribed and does feel they are helpful yet wishes they were more effective. Pt states "I still have really bad days where I cry all day". She reports last episode of this nature was last week. She states her mood fluctuates and her physical pain is impactful r/t mood. Pt reports ongoing back discomfort and limited mobility, walking with a walker.  When asked she states she has not been going to aquatic therapy as the state came in and closed the pool for temperature regulation reasons. Pt hopes to resume soon and states ongoing desire to go to [facility]. Pt advises transportation to [facility] is a barrier. LCSW provided info and referral to [facility] which pt states she will apply for. Pt reports her sister is back in the [place] left last month. Pt states she got a letter from [place]. She shares letter and has confusion. Letter states she meets criteria for medical dis as of May 07, 2020 but they are undecided about non medical. Karen Whitehead says she has called her attorney but has not gotten a call back. She is encouraged to call again. LCSW revisited information Tristy shared for the first time last session r/t having an abortion at age 29. She begins to cry and states she felt great relief from just talking about the topic. She reports she did try to write a letter multiple times but was not able to complete. Pt speaks about praying for forgiveness and hoping God will forgive her. Forgiveness r/t her faith was processed in detail. Pt is encouraged to allow forgiveness for self with evidence/facts of the influence her parents had over her at the time reviewed. Pt will consider letter to self she feels will be easier to construct. LCSW reviewed coping strategies and positive distractions. LCSW reviewed poc including scheduling prior to close of session. Pt states appreciation for care.    Suicidal/Homicidal: Nowithout intent/plan  Therapist Response: Pt remains receptive to care.  Plan: Return again in 4 weeks.  Diagnosis: No diagnosis found.  Collaboration of  Care: Community Stakeholder(s) AEB Referral for [facility] transportation  Patient/Guardian was advised Release of Information must be obtained prior to any record release in order to collaborate their care with an outside provider. Patient/Guardian was advised if they have not already done so to  contact the registration department to sign all necessary forms in order for [place] to release information regarding their care.   Consent: Patient/Guardian gives verbal consent for treatment and assignment of benefits for services provided during this visit. Patient/Guardian expressed understanding and agreed to proceed.   Hermine Messick, LCSW 06/21/2021

## 2021-06-25 ENCOUNTER — Other Ambulatory Visit: Payer: Self-pay

## 2021-06-25 ENCOUNTER — Ambulatory Visit: Payer: Self-pay | Admitting: Physical Medicine and Rehabilitation

## 2021-06-25 ENCOUNTER — Other Ambulatory Visit: Payer: Self-pay | Admitting: Family Medicine

## 2021-06-25 DIAGNOSIS — M791 Myalgia, unspecified site: Secondary | ICD-10-CM

## 2021-06-25 MED ORDER — GLIPIZIDE 5 MG PO TABS
ORAL_TABLET | ORAL | 2 refills | Status: DC
  Filled 2021-06-25: qty 30, 30d supply, fill #0
  Filled 2021-08-05 – 2021-08-13 (×2): qty 30, 30d supply, fill #1
  Filled 2021-09-26: qty 30, 30d supply, fill #2

## 2021-06-25 NOTE — Telephone Encounter (Signed)
Requested medication (s) are due for refill today: yes  Requested medication (s) are on the active medication list: yes  Last refill:  10/31/20 #60 with 6 RF  Future visit scheduled: no  Notes to clinic:  This medication can not be delegated, please assess.    Requested Prescriptions  Pending Prescriptions Disp Refills   cyclobenzaprine (FLEXERIL) 10 MG tablet 60 tablet 6    Sig: Take 1 tablet (10 mg total) by mouth 2 (two) times daily as needed for muscle spasms as needed for muscle pain     Not Delegated - Analgesics:  Muscle Relaxants Failed - 06/25/2021 11:22 AM      Failed - This refill cannot be delegated      Failed - Valid encounter within last 6 months    Recent Outpatient Visits           6 months ago Annual physical exam   [place], [place], MD   7 months ago Type 2 diabetes mellitus with hyperglycemia, without long-term current use of insulin ([place])   [place], [place], MD   10 months ago Type 2 diabetes mellitus with hyperglycemia, without long-term current use of insulin ([place])   [place], Enobong, MD   1 year ago Chronic pain of both knees   [place], [place], MD   1 year ago Type 2 diabetes mellitus with other specified complication, without long-term current use of insulin ([place])   [place], Charlane Ferretti, MD              Signed Prescriptions Disp Refills   glipiZIDE (GLUCOTROL) 5 MG tablet 30 tablet 2    Sig: TAKE 0.5 TABLETS (2.5 MG TOTAL) BY MOUTH 2 (TWO) TIMES DAILY BEFORE A MEAL.     Endocrinology:  Diabetes - Sulfonylureas Failed - 06/25/2021 11:22 AM      Failed - HBA1C is between 0 and 7.9 and within 180 days    HbA1c, POC (controlled diabetic range)  Date Value Ref Range Status  10/31/2020 6.4 0.0 - 7.0 % Final          Failed - Cr in normal range and  within 360 days    Creatinine, Ser  Date Value Ref Range Status  12/17/2020 1.03 (H) 0.57 - 1.00 mg/dL Final          Failed - Valid encounter within last 6 months    Recent Outpatient Visits           6 months ago Annual physical exam   [place], [place], MD   7 months ago Type 2 diabetes mellitus with hyperglycemia, without long-term current use of insulin ([place])   [place], [place], MD   10 months ago Type 2 diabetes mellitus with hyperglycemia, without long-term current use of insulin ([place])   [place], Enobong, MD   1 year ago Chronic pain of both knees   [place], [place], MD   1 year ago Type 2 diabetes mellitus with other specified complication, without long-term current use of insulin ([place])   [place], Charlane Ferretti, MD              Refused Prescriptions Disp Refills   dapagliflozin propanediol (FARXIGA) 5 MG  TABS tablet 30 tablet 3    Sig: TAKE 1 TABLET (5 MG TOTAL) BY MOUTH DAILY BEFORE BREAKFAST.     Endocrinology:  Diabetes - SGLT2 Inhibitors Failed - 06/25/2021 11:22 AM      Failed - Cr in normal range and within 360 days    Creatinine, Ser  Date Value Ref Range Status  12/17/2020 1.03 (H) 0.57 - 1.00 mg/dL Final          Failed - HBA1C is between 0 and 7.9 and within 180 days    HbA1c, POC (controlled diabetic range)  Date Value Ref Range Status  10/31/2020 6.4 0.0 - 7.0 % Final          Failed - Valid encounter within last 6 months    Recent Outpatient Visits           6 months ago Annual physical exam   [place], [place], MD   7 months ago Type 2 diabetes mellitus with hyperglycemia, without long-term current use of insulin ([place])   [place], [place], MD   10 months ago Type 2  diabetes mellitus with hyperglycemia, without long-term current use of insulin ([place])   [place], Charlane Ferretti, MD   1 year ago Chronic pain of both knees   [place], [place], MD   1 year ago Type 2 diabetes mellitus with other specified complication, without long-term current use of insulin ([place])   [place], [place], MD              Passed - eGFR in normal range and within 360 days    GFR calc Af Wyvonnia Lora  Date Value Ref Range Status  10/16/2019 >60 >60 mL/min Final   GFR calc non Af Amer  Date Value Ref Range Status  10/16/2019 >60 >60 mL/min Final   eGFR  Date Value Ref Range Status  12/17/2020 65 >59 mL/min/1.73 Final

## 2021-06-25 NOTE — Telephone Encounter (Signed)
Farxiga incorrect dose, Flexaril not delagated. Requested Prescriptions  Pending Prescriptions Disp Refills   glipiZIDE (GLUCOTROL) 5 MG tablet 30 tablet 2    Sig: TAKE 0.5 TABLETS (2.5 MG TOTAL) BY MOUTH 2 (TWO) TIMES DAILY BEFORE A MEAL.     Endocrinology:  Diabetes - Sulfonylureas Failed - 06/25/2021 11:22 AM      Failed - HBA1C is between 0 and 7.9 and within 180 days    HbA1c, POC (controlled diabetic range)  Date Value Ref Range Status  10/31/2020 6.4 0.0 - 7.0 % Final         Failed - Cr in normal range and within 360 days    Creatinine, Ser  Date Value Ref Range Status  12/17/2020 1.03 (H) 0.57 - 1.00 mg/dL Final         Failed - Valid encounter within last 6 months    Recent Outpatient Visits          6 months ago Annual physical exam   [place], [place], MD   7 months ago Type 2 diabetes mellitus with hyperglycemia, without long-term current use of insulin ([place])   [place], [place], MD   10 months ago Type 2 diabetes mellitus with hyperglycemia, without long-term current use of insulin ([place])   [place], Enobong, MD   1 year ago Chronic pain of both knees   [place], [place], MD   1 year ago Type 2 diabetes mellitus with other specified complication, without long-term current use of insulin ([place])   [place], [place], MD              dapagliflozin propanediol (FARXIGA) 5 MG TABS tablet 30 tablet 3    Sig: TAKE 1 TABLET (5 MG TOTAL) BY MOUTH DAILY BEFORE BREAKFAST.     Endocrinology:  Diabetes - SGLT2 Inhibitors Failed - 06/25/2021 11:22 AM      Failed - Cr in normal range and within 360 days    Creatinine, Ser  Date Value Ref Range Status  12/17/2020 1.03 (H) 0.57 - 1.00 mg/dL Final         Failed - HBA1C is between 0 and 7.9 and within 180 days    HbA1c, POC  (controlled diabetic range)  Date Value Ref Range Status  10/31/2020 6.4 0.0 - 7.0 % Final         Failed - Valid encounter within last 6 months    Recent Outpatient Visits          6 months ago Annual physical exam   [place], [place], MD   7 months ago Type 2 diabetes mellitus with hyperglycemia, without long-term current use of insulin ([place])   [place], [place], MD   10 months ago Type 2 diabetes mellitus with hyperglycemia, without long-term current use of insulin ([place])   [place], Enobong, MD   1 year ago Chronic pain of both knees   [place], [place], MD   1 year ago Type 2 diabetes mellitus with other specified complication, without long-term current use of insulin ([place])   [place], [place], MD             Passed - eGFR in normal range and within  360 days    GFR calc Af Amer  Date Value Ref Range Status  10/16/2019 >60 >60 mL/min Final   GFR calc non Af Amer  Date Value Ref Range Status  10/16/2019 >60 >60 mL/min Final   eGFR  Date Value Ref Range Status  12/17/2020 65 >59 mL/min/1.73 Final          cyclobenzaprine (FLEXERIL) 10 MG tablet 60 tablet 6    Sig: Take 1 tablet (10 mg total) by mouth 2 (two) times daily as needed for muscle spasms as needed for muscle pain     Not Delegated - Analgesics:  Muscle Relaxants Failed - 06/25/2021 11:22 AM      Failed - This refill cannot be delegated      Failed - Valid encounter within last 6 months    Recent Outpatient Visits          6 months ago Annual physical exam   [place], Charlane Ferretti, MD   7 months ago Type 2 diabetes mellitus with hyperglycemia, without long-term current use of insulin ([place])   [place], [place], MD   10 months ago Type 2  diabetes mellitus with hyperglycemia, without long-term current use of insulin ([place])   [place], Enobong, MD   1 year ago Chronic pain of both knees   [place], [place], MD   1 year ago Type 2 diabetes mellitus with other specified complication, without long-term current use of insulin [facility])   [place] [facility] Charlott Rakes, MD

## 2021-06-26 ENCOUNTER — Other Ambulatory Visit: Payer: Self-pay

## 2021-06-27 ENCOUNTER — Other Ambulatory Visit: Payer: Self-pay

## 2021-06-27 ENCOUNTER — Ambulatory Visit: Payer: Self-pay | Attending: Family Medicine

## 2021-06-28 ENCOUNTER — Other Ambulatory Visit: Payer: Self-pay

## 2021-07-08 ENCOUNTER — Ambulatory Visit: Payer: No Typology Code available for payment source | Admitting: Physical Medicine and Rehabilitation

## 2021-07-09 ENCOUNTER — Telehealth (INDEPENDENT_AMBULATORY_CARE_PROVIDER_SITE_OTHER): Payer: No Payment, Other | Admitting: Psychiatry

## 2021-07-09 DIAGNOSIS — F339 Major depressive disorder, recurrent, unspecified: Secondary | ICD-10-CM | POA: Diagnosis not present

## 2021-07-09 NOTE — Progress Notes (Signed)
BH MD/PA/NP OP Progress Note  07/09/2021 5:29 PM [name]  MRN:  [id number]  Virtual Visit via Video Note  I connected with Karen Whitehead on 07/09/21 at 10:30 AM EST by a video enabled telemedicine application and verified that I am speaking with the correct person using two identifiers.  Location: Patient: home Provider: off site   I discussed the limitations of evaluation and management by telemedicine and the availability of in person appointments. The patient expressed understanding and agreed to proceed.   I discussed the assessment and treatment plan with the patient. The patient was provided an opportunity to ask questions and all were answered. The patient agreed with the plan and demonstrated an understanding of the instructions.   The patient was advised to call back or seek an in-person evaluation if the symptoms worsen or if the condition fails to improve as anticipated.  I provided 30 minutes of non-face-to-face time during this encounter.   Franne Grip, NP   Chief Complaint: Medication management  HPI: Karen Whitehead is a 56 year old female presenting to [facility] behavioral health outpatient for a follow-up psychiatric evaluation.  Patient has a psychiatric history of anxiety and major depressive disorder.  Her symptoms are treated with clonazepam 0.5 mg twice daily as needed for anxiety, Depakote ER 500 mg at bedtime, risperidone 1 mg twice daily, Zoloft 200 mg daily, and trazodone 100 mg at bedtime.  She reports medication compliance and denies adverse reactions. Patient is alert and oriented x4, calm, pleasant and willing to engage.  She is dressed appropriately for the weather and appears well-groomed.  She reports depressed moods at times but plans to join the [facility] for socialization and exercise.  Patient reports back discomfort limits her mobility which also gets her down.  Patient also reports a skin disorder causing people to stare.patient reports she wears  long sleeve shirts to avoid people staring.  She reports coping with prayer.  She reports good sleep and appetite.  Patient reports improving her dietary choices to improve her overall health.  She denies suicidal or homicidal ideations, paranoia, delusional thought, or visual or auditory hallucinations.   Visit Diagnosis:    ICD-10-CM   1. Major depressive disorder, recurrent episode with anxious distress (HCC)  F33.9       Past Psychiatric History: major depressive disorder and anxiety  Past Medical History:  Past Medical History:  Diagnosis Date   Anxiety    Arthritis    Chronic kidney disease    Depression    Diabetes mellitus without complication ([place])    Granuloma annulare    Hyperlipidemia     Past Surgical History:  Procedure Laterality Date   APPENDECTOMY      Family Psychiatric History: None known  Family History:  Family History  Problem Relation Age of Onset   Stomach cancer Mother    Heart attack Father    Breast cancer Neg Hx    Colon cancer Neg Hx    Colon polyps Neg Hx     Social History:  Social History   Socioeconomic History   Marital status: Single    Spouse name: Not on file   Number of children: 1   Years of education: Not on file   Highest education level: Some college, no degree  Occupational History   Not on file  Tobacco Use   Smoking status: Every Day    Packs/day: 0.50    Types: Cigarettes   Smokeless tobacco: Never  Vaping Use  Vaping Use: Never used  Substance and Sexual Activity   Alcohol use: No   Drug use: No   Sexual activity: Not Currently  Other Topics Concern   Not on file  Social History Narrative   Not on file   Social Determinants of Health   Financial Resource Strain: Not on file  Food Insecurity: Food Insecurity Present   Worried About [place] in the Last Year: Often true   Ran Out of Food in the Last Year: Sometimes true  Transportation Needs: No Transportation Needs   Lack of  Transportation (Medical): No   Lack of Transportation (Non-Medical): No  Physical Activity: Not on file  Stress: Not on file  Social Connections: Not on file    Allergies: No Known Allergies  Metabolic Disorder Labs: Lab Results  Component Value Date   HGBA1C 6.4 10/31/2020   No results found for: PROLACTIN Lab Results  Component Value Date   CHOL 282 (H) 10/31/2020   TRIG 257 (H) 10/31/2020   HDL 47 10/31/2020   CHOLHDL 6.0 (H) 10/31/2020   LDLCALC 185 (H) 10/31/2020   Lab Results  Component Value Date   TSH 1.050 12/21/2020    Therapeutic Level Labs: No results found for: LITHIUM No results found for: VALPROATE No components found for:  CBMZ  Current Medications: Current Outpatient Medications  Medication Sig Dispense Refill   albuterol (VENTOLIN HFA) 108 (90 Base) MCG/ACT inhaler INHALE 1-2 PUFFS INTO THE LUNGS EVERY 6 (SIX) HOURS AS NEEDED FOR WHEEZING OR SHORTNESS OF BREATH. (Patient taking differently: Inhale 2 puffs into the lungs every 6 (six) hours as needed for wheezing or shortness of breath.) 18 g 1   atorvastatin (LIPITOR) 40 MG tablet Take 1 tablet (40 mg total) by mouth daily. 90 tablet 3   Blood Glucose Monitoring Suppl (TRUE METRIX METER) w/Device KIT 1 each by Does not apply route in the morning and at bedtime. 1 kit 0   clonazePAM (KLONOPIN) 0.5 MG tablet Take 1 tablet (0.5 mg total) by mouth 2 (two) times daily as needed. for anxiety 60 tablet 2   cyclobenzaprine (FLEXERIL) 10 MG tablet Take 1 tablet (10 mg total) by mouth 2 (two) times daily as needed for muscle spasms as needed for muscle pain 60 tablet 6   dapagliflozin propanediol (FARXIGA) 10 MG TABS tablet Take by mouth daily.     divalproex (DEPAKOTE ER) 500 MG 24 hr tablet Take 1 tablet (500 mg total) by mouth at bedtime. 30 tablet 6   fluticasone (CUTIVATE) 0.005 % ointment Apply 1 application topically 2 (two) times daily.     gabapentin (NEURONTIN) 300 MG capsule Take 1 capsule (300 mg total)  by mouth 3 (three) times daily. 90 capsule 6   glipiZIDE (GLUCOTROL) 5 MG tablet TAKE 0.5 TABLETS (2.5 MG TOTAL) BY MOUTH 2 (TWO) TIMES DAILY BEFORE A MEAL. 30 tablet 2   glucose blood (TRUE METRIX BLOOD GLUCOSE TEST) test strip Use as instructed 100 each 12   HYDROcodone-acetaminophen (NORCO) 7.5-325 MG tablet Take 1 tablet by mouth 4 (four) times daily as needed for moderate pain. 120 tablet 0   ibuprofen (ADVIL) 200 MG tablet Take 200 mg by mouth every 6 (six) hours as needed for headache, mild pain or moderate pain.     lidocaine (LIDODERM) 5 % Place 2 patches onto the skin daily. Remove & Discard patch within 12 hours or as directed by MD 60 patch 11   risperiDONE (RISPERDAL) 1 MG tablet Take  1 tablet (1 mg total) by mouth 2 (two) times daily. 60 tablet 3   sertraline (ZOLOFT) 100 MG tablet TAKE 2 TABLETS BY MOUTH ONCE A DAY 60 tablet 3   SUMAtriptan (IMITREX) 50 MG tablet Take 1 tablet (50 mg total) by mouth every 2 (two) hours as needed for migraine. May repeat in 2 hours if headache persists or recurs. 12 tablet 6   topiramate (TOPAMAX) 50 MG tablet Take 1 tablet (50 mg total) by mouth 2 (two) times daily. 60 tablet 3   traZODone (DESYREL) 100 MG tablet TAKE 2 TABLETS BY MOUTH AT BEDTIME 60 tablet 3   TRUEplus Lancets 28G MISC Use as directed in the morning and at bedtime. 100 each 1   Vitamin D, Ergocalciferol, (DRISDOL) 1.25 MG (50000 UNIT) CAPS capsule Take 1 capsule (50,000 Units total) by mouth every 7 (seven) days. 7 capsule 0   No current facility-administered medications for this visit.     Musculoskeletal: Strength & Muscle Tone:  n/a virtual visit Gait & Station: n/a virtual visit Patient leans: N/A  Psychiatric Specialty Exam: Review of Systems  Psychiatric/Behavioral:  Positive for dysphoric mood. Negative for hallucinations, self-injury, sleep disturbance and suicidal ideas.   All other systems reviewed and are negative.  There were no vitals taken for this visit.There  is no height or weight on file to calculate BMI.  General Appearance: Fairly Groomed  Eye Contact:  Good  Speech:  Clear and Coherent  Volume:  Normal  Mood:  Depressed  Affect:  Congruent  Thought Process:  Goal Directed  Orientation:  Full (Time, Place, and Person)  Thought Content: Logical   Suicidal Thoughts: No  Homicidal Thoughts: No  Memory: Good  Judgement: Good  Insight: Good  Psychomotor Activity: N/A  Concentration: Good  Recall: Good  Fund of Knowledge: Good  Language: Good  Akathisia: N/A  Handed: Right  AIMS (if indicated): Not done  Assets:  Communication Skills Desire for Improvement  ADL's:  Intact  Cognition: WNL  Sleep:  Good   Screenings: GAD-7    Flowsheet Row Video Visit from 05/21/2021 in [facility] Video Visit from 04/16/2021 in [facility] Office Visit from 03/14/2021 in [facility] [place] [facility] Video Visit from 01/09/2021 in [facility] Office Visit from 12/17/2020 in [place]  Total GAD-7 Score $RemoveBef'17 16 21 16 21      'vTqlAOFPRm$ Mini-Mental    [place] Office Visit from 12/21/2020 in [facility]  Total Score (max 30 points ) 22      PHQ2-9    Flowsheet Row Video Visit from 05/21/2021 in [facility] Video Visit from 04/16/2021 in [facility] Office Visit from 03/14/2021 in [facility] [place] [facility] Office Visit from 03/08/2021 in [facility] Video Visit from 01/09/2021 in [facility]  PHQ-2 Total Score $RemoveBef'6 6 6 2 6  'pJMdWLuZjB$ PHQ-9 Total Score $RemoveBef'21 23 26 'pwVxZtHlxe$ -- 22      Flowsheet Row Video Visit from 05/21/2021 in [facility] Video Visit from 04/16/2021 in [facility] ED from  03/18/2021 in [place] Error: Q7 should not be populated when Q6 is No Error: Q7 should not be populated when Q6 is No No Risk  Assessment and Plan: Bella Brummet is a 56 year old female presenting to [facility] behavioral health outpatient for a follow-up psychiatric evaluation.  Patient has a psychiatric history of anxiety and major depressive disorder.  Her symptoms are treated with clonazepam 0.5 mg twice daily as needed for anxiety, Depakote ER 500 mg at bedtime, risperidone 1 mg twice daily, Zoloft 200 mg daily, and trazodone 100 mg at bedtime.  She reports medication compliance and denies adverse reactions.  No medication changes today.  Patient denied the need for medication refills today.  Collaboration of Care: Collaboration of Care:   Return to care in 3 months Continue monthly therapy  Patient/Guardian was advised Release of Information must be obtained prior to any record release in order to collaborate their care with an outside provider. Patient/Guardian was advised if they have not already done so to contact the registration department to sign all necessary forms in order for [place] to release information regarding their care.   Consent: Patient/Guardian gives verbal consent for treatment and assignment of benefits for services provided during this visit. Patient/Guardian expressed understanding and agreed to proceed.    Franne Grip, NP 07/09/2021, 5:29 PM

## 2021-07-11 ENCOUNTER — Telehealth (HOSPITAL_COMMUNITY): Payer: No Payment, Other | Admitting: Psychiatry

## 2021-07-16 ENCOUNTER — Ambulatory Visit (INDEPENDENT_AMBULATORY_CARE_PROVIDER_SITE_OTHER): Payer: No Payment, Other | Admitting: Licensed Clinical Social Worker

## 2021-07-16 DIAGNOSIS — F339 Major depressive disorder, recurrent, unspecified: Secondary | ICD-10-CM

## 2021-07-17 ENCOUNTER — Telehealth: Payer: Self-pay | Admitting: Registered Nurse

## 2021-07-17 MED ORDER — HYDROCODONE-ACETAMINOPHEN 7.5-325 MG PO TABS: 1.0000 | ORAL_TABLET | Freq: Four times a day (QID) | ORAL | 0 refills | Status: DC | PRN

## 2021-07-17 NOTE — Telephone Encounter (Signed)
PMP was Reviewed.  ?Hydrocodone e-scribed today.  ?Ms. Marcom is aware via My- Chart Message.  ?

## 2021-07-17 NOTE — Progress Notes (Signed)
? ?THERAPIST PROGRESS NOTE ? ?Virtual Visit via Video Note ? ?I connected with Karen Whitehead on 07/16/21 at  2:00 PM EDT by a video enabled telemedicine application and verified that I am speaking with the correct person using two identifiers. ? ?Location: ?Patient: Home ?Provider: [facility] ?  ?I discussed the limitations of evaluation and management by telemedicine and the availability of in person appointments. The patient expressed understanding and agreed to proceed. ?I discussed the assessment and treatment plan with the patient. The patient was provided an opportunity to ask questions and all were answered. The patient agreed with the plan and demonstrated an understanding of the instructions. ?  ?The patient was advised to call back or seek an in-person evaluation if the symptoms worsen or if the condition fails to improve as anticipated. ? ?I provided 18 minutes of non-face-to-face time during this encounter. ? ?Participation Level: Active ? ?Behavioral Response: Casual and NeatAlertEuthymic ? ?Type of Therapy: Individual Therapy ? ?Treatment Goals addressed: dep/anx/stressors/coping ? ?ProgressTowards Goals: Progressing ? ?Interventions: Solution Focused and Supportive ? ?Summary: Karen Whitehead is a 56 y.o. female who presents with hx of dep/anx. Today pt logs on for video session. She is noted to be in a bright and happy mood, smiling. Pt states she got surprised by her son a short time ago who came home from work unexpectedly and is going to have time to take her to get her glasses fixed. She wants clinician to meet her son, [name], and he sits down beside her. He speaks highly of his mom and his love for her. He is openly affectionate with her. [name] advises he has worked 17 days in a row on his job and his boss acknowledged his hours and sent him home early. LCSW agrees pt and son should prioritize getting out and spending time together while getting glasses repaired at the same time. Pt agrees for LCSW  to ask a few quick questions and give some needed info and states it is ok to speak in front of son. LCSW assessed for pt meds after last adjustments. Pt states she was unable to take new med d/t side effects, too drowsy. She agrees to call med provider to advise of outcome. LCSW assessed for status of disability. Pt reports she is approved and retro 5-6 mon. Pt very relieved she will have regular income and be able to help support the household, her son. LCSW informs pt this clinician has resigned position with [facility]. Pt becomes very distressed. LCSW assisted pt to process thoughts/feelings. LCSW advised of plan to see her one more time before clinician's last day so she can go out with son and there will be more time for her to adjust to pending change in care. Pt in agreement with plan and states appreciation for care.    ? ?Suicidal/Homicidal: Nowithout intent/plan ? ?Therapist Response: Pt receptive to care. ? ?Plan: Return again in 1 week. ? ?Diagnosis: MDD with anxious distress ? ?Collaboration of Care: Other None deemed necessary this session. ? ?Patient/Guardian was advised Release of Information must be obtained prior to any record release in order to collaborate their care with an outside provider. Patient/Guardian was advised if they have not already done so to contact the registration department to sign all necessary forms in order for [place] to release information regarding their care.  ? ?Consent: Patient/Guardian gives verbal consent for treatment and assignment of benefits for services provided during this visit. Patient/Guardian expressed understanding and agreed to proceed.  ? ?  Hermine Messick, LCSW ?07/17/2021 ? ?

## 2021-07-23 ENCOUNTER — Telehealth (HOSPITAL_COMMUNITY): Payer: Self-pay | Admitting: Licensed Clinical Social Worker

## 2021-07-23 ENCOUNTER — Encounter (HOSPITAL_COMMUNITY): Payer: Self-pay

## 2021-07-23 ENCOUNTER — Ambulatory Visit (HOSPITAL_COMMUNITY): Payer: No Payment, Other | Admitting: Licensed Clinical Social Worker

## 2021-07-23 NOTE — Telephone Encounter (Signed)
LCSW sent text message for video session per schedule. When pt failed to sign on sent text a second time. When pt failed to sign on LCSW called pt's phone. Her step mother answers phone and advised pt left her phone in step mother's car. Step mother advises she works in [place] and will not be able to take pt's phone back to her until this evening.  ?

## 2021-07-25 ENCOUNTER — Encounter: Payer: Self-pay | Admitting: Physical Medicine and Rehabilitation

## 2021-07-25 ENCOUNTER — Other Ambulatory Visit: Payer: Self-pay

## 2021-07-25 ENCOUNTER — Encounter: Payer: Self-pay | Attending: Physical Medicine and Rehabilitation | Admitting: Physical Medicine and Rehabilitation

## 2021-07-25 VITALS — BP 144/93 | HR 80 | Ht 64.0 in | Wt 222.0 lb

## 2021-07-25 DIAGNOSIS — Z5181 Encounter for therapeutic drug level monitoring: Secondary | ICD-10-CM | POA: Insufficient documentation

## 2021-07-25 DIAGNOSIS — G894 Chronic pain syndrome: Secondary | ICD-10-CM | POA: Insufficient documentation

## 2021-07-25 DIAGNOSIS — Z79899 Other long term (current) drug therapy: Secondary | ICD-10-CM | POA: Insufficient documentation

## 2021-07-25 NOTE — Progress Notes (Signed)
Trigger Point Injection  Indication: Lumbar myofascial pain not relieved by medication management and other conservative care.  Informed consent was obtained after describing risk and benefits of the procedure with the patient, this includes bleeding, bruising, infection and medication side effects.  The patient wishes to proceed and has given written consent.  The patient was placed in a seated position.  The area of pain was marked and prepped with Betadine.  It was entered with a 25-gauge 1/2 inch needle and a total of 5 mL of 1% lidocaine and normal saline was injected into a total of 3 trigger points, after negative draw back for blood.  The patient tolerated the procedure well.  Post procedure instructions were given.  

## 2021-07-31 ENCOUNTER — Telehealth (HOSPITAL_COMMUNITY): Payer: Self-pay | Admitting: Licensed Clinical Social Worker

## 2021-07-31 NOTE — Telephone Encounter (Signed)
LCSW placed phone call to patient who missed her last appointment because she accidentally left her phone in her stepmother's car.  Patient was aware that was her final session with this clinician due to clinician's resignation.  LCSW was able to speak with patient who was very grateful for call. LCSW informed her of transition plan for new counselor.  Facilitated some reminiscence related to care provided and encouraged patient to continue on her journey with new counselor.  Patient and LCSW wish each other well. ?

## 2021-08-01 ENCOUNTER — Telehealth: Payer: Self-pay | Admitting: *Deleted

## 2021-08-01 LAB — TOXASSURE SELECT,+ANTIDEPR,UR

## 2021-08-01 NOTE — Telephone Encounter (Signed)
Urine drug screen for this encounter is consistent for prescribed medication 

## 2021-08-05 ENCOUNTER — Other Ambulatory Visit: Payer: Self-pay

## 2021-08-12 ENCOUNTER — Other Ambulatory Visit: Payer: Self-pay

## 2021-08-13 ENCOUNTER — Other Ambulatory Visit: Payer: Self-pay

## 2021-08-13 ENCOUNTER — Ambulatory Visit (HOSPITAL_COMMUNITY): Payer: No Payment, Other | Admitting: Licensed Clinical Social Worker

## 2021-08-14 ENCOUNTER — Other Ambulatory Visit: Payer: Self-pay

## 2021-09-24 ENCOUNTER — Ambulatory Visit: Payer: No Typology Code available for payment source | Admitting: Physical Medicine and Rehabilitation

## 2021-09-26 ENCOUNTER — Other Ambulatory Visit: Payer: Self-pay | Admitting: Family Medicine

## 2021-09-27 ENCOUNTER — Telehealth (HOSPITAL_COMMUNITY): Payer: No Payment, Other | Admitting: Psychiatry

## 2021-09-27 ENCOUNTER — Other Ambulatory Visit: Payer: Self-pay

## 2021-10-01 ENCOUNTER — Other Ambulatory Visit: Payer: Self-pay

## 2021-10-01 ENCOUNTER — Telehealth (HOSPITAL_COMMUNITY): Payer: No Payment, Other | Admitting: Psychiatry

## 2021-10-04 ENCOUNTER — Encounter: Payer: Self-pay | Admitting: Physical Medicine and Rehabilitation

## 2021-10-28 ENCOUNTER — Encounter
Payer: Medicaid Other | Attending: Physical Medicine and Rehabilitation | Admitting: Physical Medicine and Rehabilitation

## 2021-10-28 ENCOUNTER — Encounter: Payer: Self-pay | Admitting: Physical Medicine and Rehabilitation

## 2021-10-28 VITALS — Ht 64.0 in | Wt 231.0 lb

## 2021-10-28 DIAGNOSIS — M542 Cervicalgia: Secondary | ICD-10-CM | POA: Insufficient documentation

## 2021-10-28 MED ORDER — HYDROCODONE-ACETAMINOPHEN 7.5-325 MG PO TABS: 1.0000 | ORAL_TABLET | Freq: Four times a day (QID) | ORAL | 0 refills | Status: DC | PRN

## 2021-10-30 ENCOUNTER — Telehealth: Payer: Self-pay | Admitting: Family Medicine

## 2021-10-30 NOTE — Telephone Encounter (Signed)
I called the patient and she said she has no one to bring her to the appointment at [facility] tomorrow.  She does not have insurance that provides rides to medical appointments.  I spoke to Roland/ [facility] and scheduled her a ride to the clinic tomorrow, pick up time 1000. The voucher was sent to [place] via secure email.  The clinic will cab arrange transportation for her back home after her appointment tomorrow.    I called the patient and informed her that she will be picked up by Assunta Gambles at 1000 tomorrow. She said she would be ready. I explained to her that they will only wait 5 minutes.  She uses a walker and said she is able to get into a car.

## 2021-10-30 NOTE — Telephone Encounter (Signed)
Routing to Case manager for assistance.

## 2021-10-30 NOTE — Telephone Encounter (Signed)
Patient called in to check status on her transportation request. Patient says that she was told by [employer] that the office would have to reach out to them to schedule the transportation for her appointment tomorrow. If not, patient would like for provider to assist her with getting set up with [facility] transportation. Patient is concerned about not having transportation for her appointment tomorrow.

## 2021-10-31 ENCOUNTER — Other Ambulatory Visit: Payer: Self-pay

## 2021-10-31 ENCOUNTER — Encounter: Payer: Self-pay | Admitting: Family Medicine

## 2021-10-31 ENCOUNTER — Other Ambulatory Visit: Payer: Self-pay | Admitting: Family Medicine

## 2021-10-31 ENCOUNTER — Other Ambulatory Visit (HOSPITAL_COMMUNITY): Payer: Self-pay | Admitting: Psychiatry

## 2021-10-31 ENCOUNTER — Ambulatory Visit: Payer: Medicaid Other | Attending: Family Medicine | Admitting: Family Medicine

## 2021-10-31 ENCOUNTER — Other Ambulatory Visit (HOSPITAL_COMMUNITY): Payer: Self-pay

## 2021-10-31 VITALS — BP 119/80 | HR 73 | Temp 98.3°F | Ht 64.0 in | Wt 234.4 lb

## 2021-10-31 DIAGNOSIS — E1165 Type 2 diabetes mellitus with hyperglycemia: Secondary | ICD-10-CM

## 2021-10-31 DIAGNOSIS — E785 Hyperlipidemia, unspecified: Secondary | ICD-10-CM

## 2021-10-31 DIAGNOSIS — F339 Major depressive disorder, recurrent, unspecified: Secondary | ICD-10-CM

## 2021-10-31 DIAGNOSIS — E1169 Type 2 diabetes mellitus with other specified complication: Secondary | ICD-10-CM

## 2021-10-31 DIAGNOSIS — F17210 Nicotine dependence, cigarettes, uncomplicated: Secondary | ICD-10-CM

## 2021-10-31 DIAGNOSIS — F411 Generalized anxiety disorder: Secondary | ICD-10-CM

## 2021-10-31 DIAGNOSIS — Z6841 Body Mass Index (BMI) 40.0 and over, adult: Secondary | ICD-10-CM

## 2021-10-31 DIAGNOSIS — F3181 Bipolar II disorder: Secondary | ICD-10-CM

## 2021-10-31 DIAGNOSIS — M791 Myalgia, unspecified site: Secondary | ICD-10-CM

## 2021-10-31 LAB — POCT GLYCOSYLATED HEMOGLOBIN (HGB A1C): HbA1c, POC (controlled diabetic range): 6.8 % (ref 0.0–7.0)

## 2021-10-31 LAB — GLUCOSE, POCT (MANUAL RESULT ENTRY): POC Glucose: 138 mg/dl — AB (ref 70–99)

## 2021-10-31 MED ORDER — RISPERIDONE 1 MG PO TABS
1.0000 mg | ORAL_TABLET | Freq: Two times a day (BID) | ORAL | 3 refills | Status: AC
  Filled 2021-10-31 (×2): qty 60, 30d supply, fill #0
  Filled 2021-11-27: qty 60, 30d supply, fill #1
  Filled 2022-03-03: qty 60, 30d supply, fill #2
  Filled 2022-05-01: qty 60, 30d supply, fill #3

## 2021-10-31 MED ORDER — GLIPIZIDE 5 MG PO TABS
ORAL_TABLET | ORAL | 6 refills | Status: DC
  Filled 2021-10-31: qty 30, 30d supply, fill #0
  Filled 2021-11-27: qty 30, 30d supply, fill #1
  Filled 2022-03-03: qty 30, 30d supply, fill #2
  Filled 2022-05-01: qty 30, 30d supply, fill #3
  Filled 2022-05-28: qty 30, 30d supply, fill #4
  Filled 2022-10-06 – 2022-10-07 (×2): qty 30, 30d supply, fill #5

## 2021-10-31 MED ORDER — TRAZODONE HCL 100 MG PO TABS
ORAL_TABLET | Freq: Every day | ORAL | 3 refills | Status: DC
  Filled 2021-10-31 (×2): qty 60, 30d supply, fill #0
  Filled 2022-08-22: qty 60, 30d supply, fill #1
  Filled 2022-10-06 – 2022-10-07 (×2): qty 60, 30d supply, fill #2

## 2021-10-31 MED ORDER — FLUTICASONE PROPIONATE 0.005 % EX OINT
1.0000 | TOPICAL_OINTMENT | Freq: Two times a day (BID) | CUTANEOUS | 0 refills | Status: AC
  Filled 2021-10-31 (×3): qty 30, 15d supply, fill #0

## 2021-10-31 MED ORDER — SERTRALINE HCL 100 MG PO TABS
ORAL_TABLET | Freq: Every day | ORAL | 3 refills | Status: DC
  Filled 2021-10-31 (×2): qty 60, 30d supply, fill #0
  Filled 2021-11-27: qty 60, 30d supply, fill #1
  Filled 2022-03-03: qty 60, 30d supply, fill #2
  Filled 2022-05-01: qty 60, 30d supply, fill #3

## 2021-10-31 MED ORDER — OZEMPIC (0.25 OR 0.5 MG/DOSE) 2 MG/3ML ~~LOC~~ SOPN
0.2500 mg | PEN_INJECTOR | SUBCUTANEOUS | 6 refills | Status: DC
  Filled 2021-10-31: qty 3, 30d supply, fill #0

## 2021-10-31 NOTE — Telephone Encounter (Signed)
Requested medication (s) are due for refill today: yes  Requested medication (s) are on the active medication list: yes  Last refill:  10/31/20  Future visit scheduled: yes  Notes to clinic:  Unable to refill per protocol, cannot delegate.      Requested Prescriptions  Pending Prescriptions Disp Refills   cyclobenzaprine (FLEXERIL) 10 MG tablet 60 tablet 6    Sig: Take 1 tablet (10 mg total) by mouth 2 (two) times daily as needed for muscle spasms as needed for muscle pain     Not Delegated - Analgesics:  Muscle Relaxants Failed - 10/31/2021 10:20 AM      Failed - This refill cannot be delegated      Passed - Valid encounter within last 6 months    Recent Outpatient Visits           Today Type 2 diabetes mellitus with hyperglycemia, without long-term current use of insulin ([place])   [place], Charlane Ferretti, MD   10 months ago Annual physical exam   [place], [place], MD   1 year ago Type 2 diabetes mellitus with hyperglycemia, without long-term current use of insulin ([place])   [place], Ad[place], MD   1 year ago Type 2 diabetes mellitus with hyperglycemia, without long-term current use of insulin ([place])   Keith, Enobong, MD   1 year ago Chronic pain of both knees   [place], Enobong, MD       Future Appointments             In 1 month Daisy Blossom, Jarome Matin, [place]   In 6 months Charlott Rakes, MD [place]

## 2021-10-31 NOTE — Progress Notes (Signed)
Subjective:  Patient ID: Karen Whitehead, female    DOB: Sep 03, 1965  Age: 56 y.o. MRN: [id number]  CC: Diabetes   HPI Karen Whitehead is a 56 y.o. year old female with a history of Type2 DM (A1c 6.8), anxiety and depression, insomnia, Granuloma Annulare, cervical radiculopathy.  Interval History:  She is concerned about unintentional weight gain. She has gained 12 lbs in 3 months. Review of her med list indicates she is on psychotropic medications including Risperdal, Zoloft, Depakote, trazodone prescribed by psychiatry at [facility] behavioral health.  She complains of wanting to eat all the time.  A1c 6.8 and she is doing well on glipizide.  Denies numbness Hypoglycemia or visual concerns.  Also on her statin. Last lipid panel was uncontrolled. Her chronic pain is managed by rehab medicine and her symptoms are stable. Past Medical History:  Diagnosis Date   Anxiety    Arthritis    Chronic kidney disease    Depression    Diabetes mellitus without complication ([place])    Granuloma annulare    Hyperlipidemia     Past Surgical History:  Procedure Laterality Date   APPENDECTOMY      Family History  Problem Relation Age of Onset   Stomach cancer Mother    Heart attack Father    Breast cancer Neg Hx    Colon cancer Neg Hx    Colon polyps Neg Hx     Social History   Socioeconomic History   Marital status: Single    Spouse name: Not on file   Number of children: 1   Years of education: Not on file   Highest education level: Some college, no degree  Occupational History   Not on file  Tobacco Use   Smoking status: Every Day    Packs/day: 0.50    Types: Cigarettes   Smokeless tobacco: Never  Vaping Use   Vaping Use: Never used  Substance and Sexual Activity   Alcohol use: No   Drug use: No   Sexual activity: Not Currently  Other Topics Concern   Not on file  Social History Narrative   Not on file   Social Determinants of Health   Financial Resource  Strain: Not on file  Food Insecurity: Food Insecurity Present (03/14/2021)   Hunger Vital Sign    Worried About Running Out of Food in the Last Year: Often true    Ran Out of Food in the Last Year: Sometimes true  Transportation Needs: No Transportation Needs (03/14/2021)   PRAPARE - Hydrologist (Medical): No    Lack of Transportation (Non-Medical): No  Physical Activity: Not on file  Stress: Not on file  Social Connections: Not on file    No Known Allergies  Outpatient Medications Prior to Visit  Medication Sig Dispense Refill   albuterol (VENTOLIN HFA) 108 (90 Base) MCG/ACT inhaler INHALE 1-2 PUFFS INTO THE LUNGS EVERY 6 (SIX) HOURS AS NEEDED FOR WHEEZING OR SHORTNESS OF BREATH. (Patient taking differently: Inhale 2 puffs into the lungs every 6 (six) hours as needed for wheezing or shortness of breath.) 18 g 1   atorvastatin (LIPITOR) 40 MG tablet Take 1 tablet (40 mg total) by mouth daily. 90 tablet 3   Blood Glucose Monitoring Suppl (TRUE METRIX METER) w/Device KIT 1 each by Does not apply route in the morning and at bedtime. 1 kit 0   clonazePAM (KLONOPIN) 0.5 MG tablet Take 1 tablet (0.5 mg total) by mouth  2 (two) times daily as needed. for anxiety 60 tablet 2   cyclobenzaprine (FLEXERIL) 10 MG tablet Take 1 tablet (10 mg total) by mouth 2 (two) times daily as needed for muscle spasms as needed for muscle pain 60 tablet 6   divalproex (DEPAKOTE ER) 500 MG 24 hr tablet Take 1 tablet (500 mg total) by mouth at bedtime. 30 tablet 6   fluticasone (CUTIVATE) 0.005 % ointment Apply 1 application topically 2 (two) times daily.     gabapentin (NEURONTIN) 300 MG capsule Take 1 capsule (300 mg total) by mouth 3 (three) times daily. 90 capsule 6   glucose blood (TRUE METRIX BLOOD GLUCOSE TEST) test strip Use as instructed 100 each 12   HYDROcodone-acetaminophen (NORCO) 7.5-325 MG tablet Take 1 tablet by mouth 4 (four) times daily as needed for moderate pain. 120  tablet 0   ibuprofen (ADVIL) 200 MG tablet Take 200 mg by mouth every 6 (six) hours as needed for headache, mild pain or moderate pain.     lidocaine (LIDODERM) 5 % Place 2 patches onto the skin daily. Remove & Discard patch within 12 hours or as directed by MD 60 patch 11   risperiDONE (RISPERDAL) 1 MG tablet Take 1 tablet (1 mg total) by mouth 2 (two) times daily. 60 tablet 3   sertraline (ZOLOFT) 100 MG tablet TAKE 2 TABLETS BY MOUTH ONCE A DAY 60 tablet 3   SUMAtriptan (IMITREX) 50 MG tablet Take 1 tablet (50 mg total) by mouth every 2 (two) hours as needed for migraine. May repeat in 2 hours if headache persists or recurs. 12 tablet 6   topiramate (TOPAMAX) 50 MG tablet Take 1 tablet (50 mg total) by mouth 2 (two) times daily. 60 tablet 3   traZODone (DESYREL) 100 MG tablet TAKE 2 TABLETS BY MOUTH AT BEDTIME 60 tablet 3   TRUEplus Lancets 28G MISC Use as directed in the morning and at bedtime. 100 each 1   Vitamin D, Ergocalciferol, (DRISDOL) 1.25 MG (50000 UNIT) CAPS capsule Take 1 capsule (50,000 Units total) by mouth every 7 (seven) days. 7 capsule 0   glipiZIDE (GLUCOTROL) 5 MG tablet TAKE 0.5 TABLETS (2.5 MG TOTAL) BY MOUTH 2 (TWO) TIMES DAILY BEFORE A MEAL. 30 tablet 2   No facility-administered medications prior to visit.     ROS Review of Systems  Constitutional:  Positive for unexpected weight change. Negative for activity change, appetite change and fatigue.  HENT:  Negative for congestion, sinus pressure and sore throat.   Eyes:  Negative for visual disturbance.  Respiratory:  Negative for cough, chest tightness, shortness of breath and wheezing.   Cardiovascular:  Negative for chest pain and palpitations.  Gastrointestinal:  Negative for abdominal distention, abdominal pain and constipation.  Endocrine: Negative for polydipsia.  Genitourinary:  Negative for dysuria and frequency.  Musculoskeletal:  Negative for arthralgias and back pain.  Skin:  Positive for rash.   Neurological:  Negative for tremors, light-headedness and numbness.  Hematological:  Does not bruise/bleed easily.  Psychiatric/Behavioral:  Negative for agitation and behavioral problems.     Objective:  BP 119/80   Pulse 73   Temp 98.3 F (36.8 C) (Oral)   Ht $R'5\' 4"'Mb$  (1.626 m)   Wt 234 lb 6.4 oz (106.3 kg)   SpO2 93%   BMI 40.23 kg/m      10/31/2021   11:13 AM 10/28/2021    2:53 PM 07/25/2021   10:19 AM  BP/Weight  Systolic BP 510  258  Diastolic BP 80  93  Wt. (Lbs) 234.4 231 222  BMI 40.23 kg/m2 39.65 kg/m2 38.11 kg/m2      Physical Exam Skin:    Comments: Large plaques on extremities        Latest Ref Rng & Units 12/17/2020    4:24 PM 10/16/2019    5:51 PM 10/16/2019    3:15 PM  CMP  Glucose 65 - 99 mg/dL 75   153   BUN 6 - 24 mg/dL 14   7   Creatinine 0.57 - 1.00 mg/dL 1.03   0.87   Sodium 134 - 144 mmol/L 136  139  138   Potassium 3.5 - 5.2 mmol/L 4.1  3.4  3.5   Chloride 96 - 106 mmol/L 100   100   CO2 20 - 29 mmol/L 24   24   Calcium 8.7 - 10.2 mg/dL 9.6   9.8     Lipid Panel     Component Value Date/Time   CHOL 282 (H) 10/31/2020 1004   TRIG 257 (H) 10/31/2020 1004   HDL 47 10/31/2020 1004   CHOLHDL 6.0 (H) 10/31/2020 1004   LDLCALC 185 (H) 10/31/2020 1004    CBC    Component Value Date/Time   WBC 10.5 10/16/2019 1515   RBC 4.82 10/16/2019 1515   HGB 15.6 (H) 10/16/2019 1751   HCT 46.0 10/16/2019 1751   PLT 289 10/16/2019 1515   MCV 92.5 10/16/2019 1515   MCH 31.5 10/16/2019 1515   MCHC 34.1 10/16/2019 1515   RDW 12.6 10/16/2019 1515   LYMPHSABS 3.0 11/10/2018 1315   MONOABS 0.6 11/10/2018 1315   EOSABS 0.3 11/10/2018 1315   BASOSABS 0.1 11/10/2018 1315    Lab Results  Component Value Date   HGBA1C 6.8 10/31/2021    Assessment & Plan:  1. Type 2 diabetes mellitus with hyperglycemia, without long-term current use of insulin (HCC) Controlled with A1c of 6.8 Due to recent weight gain she will be an ideal candidate for GLP-1 RA.   After shared decision making and discussing benefits, adverse effects and side effect profile she is willing to proceed with this. Clinical pharmacist called in to provide education on administration Consider discontinuing glipizide if she has hypoglycemia - POCT glucose (manual entry) - POCT glycosylated hemoglobin (Hb A1C) - Microalbumin / creatinine urine ratio - CMP14+EGFR - Semaglutide,0.25 or 0.5MG/DOS, (OZEMPIC, 0.25 OR 0.5 MG/DOSE,) 2 MG/3ML SOPN; Inject 0.25 mg into the skin once a week.  Dispense: 3 mL; Refill: 6 - glipiZIDE (GLUCOTROL) 5 MG tablet; TAKE 0.5 TABLETS (2.5 MG TOTAL) BY MOUTH 2 (TWO) TIMES DAILY BEFORE A MEAL.  Dispense: 30 tablet; Refill: 6  2. Morbid obesity ([place]) Psychotropic medications especially Risperdal contributing to this She has been advised to discuss this with her Psychiatrist for other options Placed on GLP-1 RA Continue lifestyle modifications Unfortunately her ability to exercise is limited due to her chronic pain  3. Major depressive disorder, recurrent episode with anxious distress (HCC) Stable PHQ-9 scores continue to be elevated Management as per behavioral health  4. Hyperlipidemia associated with type 2 diabetes mellitus ([place]) Uncontrolled We will check lipid panel and adjust regimen accordingly Low-cholesterol diet - LP+Non-HDL Cholesterol    Meds ordered this encounter  Medications   Semaglutide,0.25 or 0.5MG/DOS, (OZEMPIC, 0.25 OR 0.5 MG/DOSE,) 2 MG/3ML SOPN    Sig: Inject 0.25 mg into the skin once a week.    Dispense:  3 mL    Refill:  6   glipiZIDE (  GLUCOTROL) 5 MG tablet    Sig: TAKE 0.5 TABLETS (2.5 MG TOTAL) BY MOUTH 2 (TWO) TIMES DAILY BEFORE A MEAL.    Dispense:  30 tablet    Refill:  6    Follow-up: Return in about 6 months (around 05/02/2022) for Chronic medical conditions.       Charlott Rakes, MD, FAAFP. [facility], [address]   10/31/2021, 11:54 AM

## 2021-10-31 NOTE — Patient Instructions (Signed)
Semaglutide Injection What is this medication? SEMAGLUTIDE (SEM a GLOO tide) treats type 2 diabetes. It works by increasing insulin levels in your body, which decreases your blood sugar (glucose). It also reduces the amount of sugar released into the blood and slows down your digestion. It can also be used to lower the risk of heart attack and stroke in people with type 2 diabetes. Changes to diet and exercise are often combined with this medication. This medicine may be used for other purposes; ask your health care provider or pharmacist if you have questions. COMMON BRAND NAME(S): OZEMPIC What should I tell my care team before I take this medication? They need to know if you have any of these conditions: Endocrine tumors (MEN 2) or if someone in your family had these tumors Eye disease, vision problems History of pancreatitis Kidney disease Stomach problems Thyroid cancer or if someone in your family had thyroid cancer An unusual or allergic reaction to semaglutide, other medications, foods, dyes, or preservatives Pregnant or trying to get pregnant Breast-feeding How should I use this medication? This medication is for injection under the skin of your upper leg (thigh), stomach area, or upper arm. It is given once every week (every 7 days). You will be taught how to prepare and give this medication. Use exactly as directed. Take your medication at regular intervals. Do not take it more often than directed. If you use this medication with insulin, you should inject this medication and the insulin separately. Do not mix them together. Do not give the injections right next to each other. Change (rotate) injection sites with each injection. It is important that you put your used needles and syringes in a special sharps container. Do not put them in a trash can. If you do not have a sharps container, call your pharmacist or care team to get one. A special MedGuide will be given to you by the  pharmacist with each prescription and refill. Be sure to read this information carefully each time. This medication comes with INSTRUCTIONS FOR USE. Ask your pharmacist for directions on how to use this medication. Read the information carefully. Talk to your pharmacist or care team if you have questions. Talk to your care team about the use of this medication in children. Special care may be needed. Overdosage: If you think you have taken too much of this medicine contact a poison control center or emergency room at once. NOTE: This medicine is only for you. Do not share this medicine with others. What if I miss a dose? If you miss a dose, take it as soon as you can within 5 days after the missed dose. Then take your next dose at your regular weekly time. If it has been longer than 5 days after the missed dose, do not take the missed dose. Take the next dose at your regular time. Do not take double or extra doses. If you have questions about a missed dose, contact your care team for advice. What may interact with this medication? Other medications for diabetes Many medications may cause changes in blood sugar, these include: Alcohol containing beverages Antiviral medications for HIV or AIDS Aspirin and aspirin-like medications Certain medications for blood pressure, heart disease, irregular heart beat Chromium Diuretics Female hormones, such as estrogens or progestins, birth control pills Fenofibrate Gemfibrozil Isoniazid Lanreotide Female hormones or anabolic steroids MAOIs like Carbex, Eldepryl, Marplan, Nardil, and Parnate Medications for weight loss Medications for allergies, asthma, cold, or cough Medications for depression,   anxiety, or psychotic disturbances Niacin Nicotine NSAIDs, medications for pain and inflammation, like ibuprofen or naproxen Octreotide Pasireotide Pentamidine Phenytoin Probenecid Quinolone antibiotics such as ciprofloxacin, levofloxacin, ofloxacin Some  herbal dietary supplements Steroid medications such as prednisone or cortisone Sulfamethoxazole; trimethoprim Thyroid hormones Some medications can hide the warning symptoms of low blood sugar (hypoglycemia). You may need to monitor your blood sugar more closely if you are taking one of these medications. These include: Beta-blockers, often used for high blood pressure or heart problems (examples include atenolol, metoprolol, propranolol) Clonidine Guanethidine Reserpine This list may not describe all possible interactions. Give your health care provider a list of all the medicines, herbs, non-prescription drugs, or dietary supplements you use. Also tell them if you smoke, drink alcohol, or use illegal drugs. Some items may interact with your medicine. What should I watch for while using this medication? Visit your care team for regular checks on your progress. Drink plenty of fluids while taking this medication. Check with your care team if you get an attack of severe diarrhea, nausea, and vomiting. The loss of too much body fluid can make it dangerous for you to take this medication. A test called the HbA1C (A1C) will be monitored. This is a simple blood test. It measures your blood sugar control over the last 2 to 3 months. You will receive this test every 3 to 6 months. Learn how to check your blood sugar. Learn the symptoms of low and high blood sugar and how to manage them. Always carry a quick-source of sugar with you in case you have symptoms of low blood sugar. Examples include hard sugar candy or glucose tablets. Make sure others know that you can choke if you eat or drink when you develop serious symptoms of low blood sugar, such as seizures or unconsciousness. They must get medical help at once. Tell your care team if you have high blood sugar. You might need to change the dose of your medication. If you are sick or exercising more than usual, you might need to change the dose of your  medication. Do not skip meals. Ask your care team if you should avoid alcohol. Many nonprescription cough and cold products contain sugar or alcohol. These can affect blood sugar. Pens should never be shared. Even if the needle is changed, sharing may result in passing of viruses like hepatitis or HIV. Wear a medical ID bracelet or chain, and carry a card that describes your disease and details of your medication and dosage times. Do not become pregnant while taking this medication. Women should inform their care team if they wish to become pregnant or think they might be pregnant. There is a potential for serious side effects to an unborn child. Talk to your care team for more information. What side effects may I notice from receiving this medication? Side effects that you should report to your care team as soon as possible: Allergic reactions--skin rash, itching, hives, swelling of the face, lips, tongue, or throat Change in vision Dehydration--increased thirst, dry mouth, feeling faint or lightheaded, headache, dark yellow or brown urine Gallbladder problems--severe stomach pain, nausea, vomiting, fever Heart palpitations--rapid, pounding, or irregular heartbeat Kidney injury--decrease in the amount of urine, swelling of the ankles, hands, or feet Pancreatitis--severe stomach pain that spreads to your back or gets worse after eating or when touched, fever, nausea, vomiting Thyroid cancer--new mass or lump in the neck, pain or trouble swallowing, trouble breathing, hoarseness Side effects that usually do not require medical   attention (report to your care team if they continue or are bothersome): Diarrhea Loss of appetite Nausea Stomach pain Vomiting This list may not describe all possible side effects. Call your doctor for medical advice about side effects. You may report side effects to FDA at 1-800-FDA-1088. Where should I keep my medication? Keep out of the reach of children. Store  unopened pens in a refrigerator between 2 and 8 degrees C (36 and 46 degrees F). Do not freeze. Protect from light and heat. After you first use the pen, it can be stored for 56 days at room temperature between 15 and 30 degrees C (59 and 86 degrees F) or in a refrigerator. Throw away your used pen after 56 days or after the expiration date, whichever comes first. Do not store your pen with the needle attached. If the needle is left on, medication may leak from the pen. NOTE: This sheet is a summary. It may not cover all possible information. If you have questions about this medicine, talk to your doctor, pharmacist, or health care provider.  2023 Elsevier/Gold Standard (2020-07-26 00:00:00)  

## 2021-10-31 NOTE — Progress Notes (Signed)
Discuss weight gain. 

## 2021-11-01 ENCOUNTER — Other Ambulatory Visit (HOSPITAL_COMMUNITY): Payer: Self-pay

## 2021-11-01 ENCOUNTER — Other Ambulatory Visit: Payer: Self-pay

## 2021-11-01 LAB — CMP14+EGFR
ALT: 22 IU/L (ref 0–32)
AST: 17 IU/L (ref 0–40)
Albumin/Globulin Ratio: 1.6 (ref 1.2–2.2)
Albumin: 4.5 g/dL (ref 3.8–4.9)
Alkaline Phosphatase: 81 IU/L (ref 44–121)
BUN/Creatinine Ratio: 13 (ref 9–23)
BUN: 12 mg/dL (ref 6–24)
Bilirubin Total: 0.3 mg/dL (ref 0.0–1.2)
CO2: 25 mmol/L (ref 20–29)
Calcium: 10.2 mg/dL (ref 8.7–10.2)
Chloride: 101 mmol/L (ref 96–106)
Creatinine, Ser: 0.92 mg/dL (ref 0.57–1.00)
Globulin, Total: 2.9 g/dL (ref 1.5–4.5)
Glucose: 123 mg/dL — ABNORMAL HIGH (ref 70–99)
Potassium: 4.5 mmol/L (ref 3.5–5.2)
Sodium: 142 mmol/L (ref 134–144)
Total Protein: 7.4 g/dL (ref 6.0–8.5)
eGFR: 74 mL/min/{1.73_m2} (ref >59)

## 2021-11-01 LAB — MICROALBUMIN / CREATININE URINE RATIO
Creatinine, Urine: 49.5 mg/dL
Microalb/Creat Ratio: 11 mg/g creat (ref 0–29)
Microalbumin, Urine: 5.5 ug/mL

## 2021-11-01 LAB — LP+NON-HDL CHOLESTEROL
Cholesterol, Total: 162 mg/dL (ref 100–199)
HDL: 49 mg/dL (ref >39)
LDL Chol Calc (NIH): 94 mg/dL (ref 0–99)
Total Non-HDL-Chol (LDL+VLDL): 113 mg/dL (ref 0–129)
Triglycerides: 101 mg/dL (ref 0–149)
VLDL Cholesterol Cal: 19 mg/dL (ref 5–40)

## 2021-11-02 ENCOUNTER — Other Ambulatory Visit (HOSPITAL_COMMUNITY): Payer: Self-pay

## 2021-11-26 ENCOUNTER — Encounter
Payer: Medicaid Other | Attending: Physical Medicine and Rehabilitation | Admitting: Physical Medicine and Rehabilitation

## 2021-11-26 ENCOUNTER — Encounter: Payer: Self-pay | Admitting: Physical Medicine and Rehabilitation

## 2021-11-26 VITALS — BP 133/84 | HR 77 | Ht 64.0 in | Wt 232.4 lb

## 2021-11-26 DIAGNOSIS — Z5181 Encounter for therapeutic drug level monitoring: Secondary | ICD-10-CM | POA: Insufficient documentation

## 2021-11-26 DIAGNOSIS — M7918 Myalgia, other site: Secondary | ICD-10-CM | POA: Insufficient documentation

## 2021-11-26 DIAGNOSIS — G894 Chronic pain syndrome: Secondary | ICD-10-CM | POA: Insufficient documentation

## 2021-11-26 DIAGNOSIS — Z79899 Other long term (current) drug therapy: Secondary | ICD-10-CM | POA: Insufficient documentation

## 2021-11-26 MED ORDER — LIDOCAINE HCL 1 % IJ SOLN: 3.0000 mL | Freq: Once | INTRAMUSCULAR | Status: AC

## 2021-11-26 NOTE — Progress Notes (Signed)
Trigger Point Injection  Indication: Lumbar myofascial pain not relieved by medication management and other conservative care.  Informed consent was obtained after describing risk and benefits of the procedure with the patient, this includes bleeding, bruising, infection and medication side effects.  The patient wishes to proceed and has given written consent.  The patient was placed in a seated position.  The area of pain was marked and prepped with Betadine.  It was entered with a 25-gauge 1/2 inch needle and a total of 5 mL of 1% lidocaine and normal saline was injected into a total of 3 trigger points, after negative draw back for blood.  The patient tolerated the procedure well.  Post procedure instructions were given.

## 2021-11-27 ENCOUNTER — Other Ambulatory Visit: Payer: Self-pay

## 2021-11-27 ENCOUNTER — Other Ambulatory Visit: Payer: Self-pay | Admitting: Family Medicine

## 2021-11-27 MED ORDER — ATORVASTATIN CALCIUM 40 MG PO TABS
40.0000 mg | ORAL_TABLET | Freq: Every day | ORAL | 3 refills | Status: DC
  Filled 2021-11-27: qty 90, 90d supply, fill #0
  Filled 2022-03-03: qty 30, 30d supply, fill #1
  Filled 2022-05-01: qty 30, 30d supply, fill #2
  Filled 2022-05-28: qty 30, 30d supply, fill #3
  Filled 2022-08-22: qty 30, 30d supply, fill #0
  Filled 2022-08-22: qty 30, 30d supply, fill #4
  Filled 2022-10-06 – 2022-10-07 (×2): qty 30, 30d supply, fill #1
  Filled 2022-11-18: qty 30, 30d supply, fill #2

## 2021-11-28 ENCOUNTER — Other Ambulatory Visit: Payer: Self-pay

## 2021-11-29 ENCOUNTER — Other Ambulatory Visit: Payer: Self-pay

## 2021-12-04 ENCOUNTER — Encounter (HOSPITAL_COMMUNITY): Payer: Self-pay

## 2021-12-04 ENCOUNTER — Ambulatory Visit (INDEPENDENT_AMBULATORY_CARE_PROVIDER_SITE_OTHER): Payer: No Payment, Other | Admitting: Licensed Clinical Social Worker

## 2021-12-04 DIAGNOSIS — F339 Major depressive disorder, recurrent, unspecified: Secondary | ICD-10-CM | POA: Diagnosis not present

## 2021-12-04 NOTE — Plan of Care (Signed)
  Problem: Depression CCP Problem  1 Learn and Apply Coping Skills to Decrease Anxiety and Depression Symptoms   Goal: LTG: Agape WILL SCORE LESS THAN 10 ON THE PATIENT HEALTH QUESTIONNAIRE (PHQ-9) Outcome: Initial Goal: STG: [name] WILL PARTICIPATE IN AT LEAST 80% OF SCHEDULED INDIVIDUAL PSYCHOTHERAPY SESSIONS Outcome: Initial Goal: STG: [name] WILL COMPLETE AT LEAST 80% OF ASSIGNED HOMEWORK Outcome: Initial Goal: STG: [name] WILL IDENTIFY AT LEAST 3 COGNITIVE PATTERNS AND BELIEFS THAT SUPPORT DEPRESSION AND ANXIETY Outcome: Initial Goal: STG: [name] WILL REDUCE FREQUENCY OF AVOIDANT BEHAVIORS BY 10% AS EVIDENCED BY SELF-REPORT IN THERAPY SESSIONS Outcome: Initial

## 2021-12-04 NOTE — Progress Notes (Signed)
Virtual Visit via Video Note  I connected with Karen Whitehead on 12/04/21 at  1:00 PM EDT by a video enabled telemedicine application and verified that I am speaking with the correct person using two identifiers.  Location: Patient: pt's home Provider: clinical office   I discussed the limitations of evaluation and management by telemedicine and the availability of in person appointments. The patient expressed understanding and agreed to proceed.   I discussed the assessment and treatment plan with the patient. The patient was provided an opportunity to ask questions and all were answered. The patient agreed with the plan and demonstrated an understanding of the instructions.   The patient was advised to call back or seek an in-person evaluation if the symptoms worsen or if the condition fails to improve as anticipated.  I provided 42 minutes of non-face-to-face time during this encounter.   Heron Nay, LCSWA    THERAPIST PROGRESS NOTE  Session Time: 42 minutes  Participation Level: Active  Behavioral Response: CasualAlertDepressed  Type of Therapy: Individual Therapy  Treatment Goals addressed: establish tx goals  ProgressTowards Goals: Initial  Interventions: Supportive  Summary: Karen Whitehead is a 56 y.o. female who presents for initial visit with this cln due to previous therapist resigning. Reports she saw her previous therapist for a little over a year. She states "my anxiety has been through the roof. I've had several panic attacks. My anxiety makes me really nervous and it makes hard for me to concentrate." She endorses her last panic attack was a week ago. She states panic attacks occur at least once per week, sometimes 2-3 times a week, and during them she experiences chest pain, diaphoresis, and shaking. She also reports her sleep "is all out of whack." She states sometimes she can't sleep and sometimes she will oversleep. I don't know why it's not getting  better. I'm still really down. I can't seem to get out of that." Per chart review, last med man appointment was January 2023 and her last therapy appointment was March 2023. She reports she has some good days but that she is still struggling. She reports she is disabled due to back issues and is getting epidural steroid injections, which help. She states she has experienced tearfulness, worthlessness, and isolating and she has not left her home in the past month. She also endorses decreased ADLs, showering only once a week sometimes. Pt agrees to PHP CCA. Pt reports she was declined for Medicaid and states she will reapply. She is receptive to feedback from cln and verbalized understanding of email boundaries.  Suicidal/Homicidal: Nowithout intent/plan  Therapist Response: Cln introduced self and asked pt to identify pertinent background information, stressors, current symptoms, and treatment goals. Cln developed tx plan to assess progress based on pt's stated goals. Cln oriented pt to PHP and scheduled CCA on 8/3. Cln emailed pt appointment info and informed her of boundaries surrounding email use- email is not to be used for emergencies or in place of sessions, but for appointment requests, cancellations, or topics to discuss during future sessions. Cln scheduled f/u for next available appt and requested pt be scheduled with NP.  Plan: Return again in 10 weeks (next available appt).  Diagnosis: Major depressive disorder, recurrent episode with anxious distress ([place])  Collaboration of Care: Other referred to [facility] PHP  Patient/Guardian was advised Release of Information must be obtained prior to any record release in order to collaborate their care with an outside provider. Patient/Guardian was advised if  they have not already done so to contact the registration department to sign all necessary forms in order for [place] to release information regarding their care.   Consent: Patient/Guardian gives verbal  consent for treatment and assignment of benefits for services provided during this visit. Patient/Guardian expressed understanding and agreed to proceed.   Heron Nay, LCSWA 12/04/2021

## 2021-12-05 ENCOUNTER — Encounter (HOSPITAL_COMMUNITY): Payer: Self-pay

## 2021-12-05 ENCOUNTER — Ambulatory Visit (HOSPITAL_COMMUNITY): Payer: No Payment, Other

## 2021-12-05 ENCOUNTER — Telehealth (HOSPITAL_COMMUNITY): Payer: Self-pay | Admitting: Professional

## 2021-12-09 ENCOUNTER — Ambulatory Visit: Payer: Self-pay | Admitting: Pharmacist

## 2021-12-30 ENCOUNTER — Other Ambulatory Visit: Payer: Self-pay | Admitting: Physical Medicine and Rehabilitation

## 2021-12-30 MED ORDER — HYDROCODONE-ACETAMINOPHEN 7.5-325 MG PO TABS: 1.0000 | ORAL_TABLET | Freq: Four times a day (QID) | ORAL | 0 refills | Status: DC | PRN

## 2022-01-28 ENCOUNTER — Other Ambulatory Visit: Payer: Self-pay

## 2022-01-28 ENCOUNTER — Encounter: Payer: Self-pay | Admitting: Physical Medicine and Rehabilitation

## 2022-01-28 ENCOUNTER — Encounter
Payer: Medicaid Other | Attending: Physical Medicine and Rehabilitation | Admitting: Physical Medicine and Rehabilitation

## 2022-01-28 VITALS — BP 123/85 | HR 80 | Temp 98.7°F | Ht 64.0 in | Wt 232.0 lb

## 2022-01-28 DIAGNOSIS — M7918 Myalgia, other site: Secondary | ICD-10-CM | POA: Insufficient documentation

## 2022-01-28 MED ORDER — HYDROCODONE-ACETAMINOPHEN 7.5-325 MG PO TABS
1.0000 | ORAL_TABLET | Freq: Every day | ORAL | 0 refills | Status: DC | PRN
  Filled 2022-01-28: qty 120, 24d supply, fill #0

## 2022-01-28 NOTE — Progress Notes (Signed)

## 2022-01-29 ENCOUNTER — Other Ambulatory Visit: Payer: Self-pay

## 2022-01-30 ENCOUNTER — Telehealth: Payer: Self-pay | Admitting: *Deleted

## 2022-01-30 ENCOUNTER — Other Ambulatory Visit: Payer: Self-pay | Admitting: Physical Medicine and Rehabilitation

## 2022-01-30 ENCOUNTER — Other Ambulatory Visit: Payer: Self-pay

## 2022-01-30 MED ORDER — HYDROCODONE-ACETAMINOPHEN 7.5-325 MG PO TABS: 1.0000 | ORAL_TABLET | Freq: Every day | ORAL | 0 refills | Status: DC | PRN

## 2022-01-30 NOTE — Telephone Encounter (Signed)
Ms Hellberg called and her pharmacy is the [facility]. [place] does not have controlled medication. Please resend her hydrocodone to the [facility].

## 2022-02-10 ENCOUNTER — Telehealth (HOSPITAL_COMMUNITY): Payer: No Payment, Other | Admitting: Licensed Clinical Social Worker

## 2022-02-11 ENCOUNTER — Ambulatory Visit (INDEPENDENT_AMBULATORY_CARE_PROVIDER_SITE_OTHER): Payer: No Payment, Other | Admitting: Licensed Clinical Social Worker

## 2022-02-11 DIAGNOSIS — F339 Major depressive disorder, recurrent, unspecified: Secondary | ICD-10-CM | POA: Diagnosis not present

## 2022-02-11 NOTE — Progress Notes (Signed)
   THERAPIST PROGRESS NOTE  Session Time: 20 minutes  Participation Level: Minimal  Behavioral Response: NAAlertEuthymic  Type of Therapy: Individual Therapy  Treatment Goals addressed: Initial session with client  ProgressTowards Goals: Initial  Interventions: Supportive  Summary: Karen Whitehead is a 56 y.o. female who presents for individual therapy session. This session is held virtually. Karen Whitehead reports that she has been struggling and she feels like she is backtracking, and she feels like she is becoming worse and not better. She reports she has been struggling with her anxiety the most. She reports that she has been compliant with medications. She reports that she is not sure that her medication is working although she has been taking it for over a year. She is provided with walk-in hours to be seen by psychiatric provider as she is unable to scheduled due to too many past no show appointments. Karen Whitehead reports that she was in [facility] but was unable to participate due to her not being able to get on the video. She reports that the only current stressor she can think of that could be triggering her anxiety is her son being laid off of his job. She denies SI/HI, AVH or psychosis. This session was ended early due to client stating she has another appointment at 1:30 that she had to go to.  Suicidal/Homicidal: Nowithout intent/plan  Therapist Response: Evaluated mental status. Assessed for issues of dangerousness. Identified client's primary concerns and encouraged medication compliance and keeping appointments. Identified coping skills client could use or have been helpful.  Plan: Return again in 2 weeks.  Diagnosis: No diagnosis found.  Collaboration of Care: Other informed of walk-in hours to be seen by psych provider.  Patient/Guardian was advised Release of Information must be obtained prior to any record release in order to collaborate their care with an outside provider.  Patient/Guardian was advised if they have not already done so to contact the registration department to sign all necessary forms in order for [place] to release information regarding their care.   Consent: Patient/Guardian gives verbal consent for treatment and assignment of benefits for services provided during this visit. Patient/Guardian expressed understanding and agreed to proceed.   Allyson Sabal, [facility] 02/11/2022

## 2022-02-27 ENCOUNTER — Ambulatory Visit (HOSPITAL_COMMUNITY): Payer: No Payment, Other | Admitting: Licensed Clinical Social Worker

## 2022-02-27 NOTE — Progress Notes (Deleted)
Subjective:    Patient ID: Karen Whitehead, female    DOB: [birth year], 56 y.o.   MRN: [id number]  HPI Pain Inventory Average Pain {NUMBERS; 0-10:5044} Pain Right Now {NUMBERS; 0-10:5044} My pain is {PAIN DESCRIPTION:21022940}  In the last 24 hours, has pain interfered with the following? General activity {NUMBERS; 0-10:5044} Relation with others {NUMBERS; 0-10:5044} Enjoyment of life {NUMBERS; 0-10:5044} What TIME of day is your pain at its worst? {time of day:24191} Sleep (in general) {BHH GOOD/FAIR/POOR:22877}  Pain is worse with: {ACTIVITIES:21022942} Pain improves with: {PAIN IMPROVES HGDJ:24268341} Relief from Meds: {NUMBERS; 0-10:5044}  Family History  Problem Relation Age of Onset   Stomach cancer Mother    Heart attack Father    Breast cancer Neg Hx    Colon cancer Neg Hx    Colon polyps Neg Hx    Social History   Socioeconomic History   Marital status: Single    Spouse name: Not on file   Number of children: 1   Years of education: Not on file   Highest education level: Some college, no degree  Occupational History   Not on file  Tobacco Use   Smoking status: Every Day    Packs/day: 0.50    Types: Cigarettes   Smokeless tobacco: Never  Vaping Use   Vaping Use: Never used  Substance and Sexual Activity   Alcohol use: No   Drug use: No   Sexual activity: Not Currently  Other Topics Concern   Not on file  Social History Narrative   Not on file   Social Determinants of Health   Financial Resource Strain: Not on file  Food Insecurity: Food Insecurity Present (03/14/2021)   Hunger Vital Sign    Worried About Running Out of Food in the Last Year: Often true    Ran Out of Food in the Last Year: Sometimes true  Transportation Needs: No Transportation Needs (03/14/2021)   PRAPARE - Hydrologist (Medical): No    Lack of Transportation (Non-Medical): No  Physical Activity: Not on file  Stress: Not on file  Social  Connections: Not on file   Past Surgical History:  Procedure Laterality Date   APPENDECTOMY     Past Surgical History:  Procedure Laterality Date   APPENDECTOMY     Past Medical History:  Diagnosis Date   Anxiety    Arthritis    Chronic kidney disease    Depression    Diabetes mellitus without complication ([place])    Granuloma annulare    Hyperlipidemia    There were no vitals taken for this visit.  Opioid Risk Score:   Fall Risk Score:  `1  Depression screen [facility] 2/9     11/26/2021   10:46 AM 10/31/2021   11:19 AM 10/28/2021    2:59 PM 07/25/2021   10:23 AM 05/21/2021   10:47 AM 04/16/2021    1:13 PM 03/14/2021    2:12 PM  Depression screen PHQ 2/9  Decreased Interest 0 3 1 0   3  Down, Depressed, Hopeless 0 '3 1 1   3  '$ PHQ - 2 Score 0 '6 2 1   6  '$ Altered sleeping  3     3  Tired, decreased energy  3     3  Change in appetite  3     3  Feeling bad or failure about yourself   3     3  Trouble concentrating  3  3  Moving slowly or fidgety/restless  1     3  Suicidal thoughts  2     2  PHQ-9 Score  24     26  Difficult doing work/chores            Information is confidential and restricted. Go to Review Flowsheets to unlock data.      Review of Systems     Objective:   Physical Exam        Assessment & Plan:

## 2022-02-28 ENCOUNTER — Encounter: Payer: Self-pay | Admitting: Registered Nurse

## 2022-02-28 DIAGNOSIS — G894 Chronic pain syndrome: Secondary | ICD-10-CM

## 2022-02-28 DIAGNOSIS — Z79899 Other long term (current) drug therapy: Secondary | ICD-10-CM

## 2022-02-28 DIAGNOSIS — Z5181 Encounter for therapeutic drug level monitoring: Secondary | ICD-10-CM

## 2022-03-03 ENCOUNTER — Other Ambulatory Visit: Payer: Self-pay

## 2022-03-13 ENCOUNTER — Telehealth (HOSPITAL_COMMUNITY): Payer: Self-pay | Admitting: *Deleted

## 2022-03-13 ENCOUNTER — Ambulatory Visit (HOSPITAL_COMMUNITY): Payer: No Payment, Other | Admitting: Licensed Clinical Social Worker

## 2022-03-13 NOTE — Telephone Encounter (Signed)
Call to nurses line this am as unable to reach staff at front desk to cancel appt today. She would like to reschedule but Probation officer not able to do that. Transferred her to the front desk to leave a message and staff can call her back to reschedule.

## 2022-04-01 ENCOUNTER — Ambulatory Visit: Payer: No Typology Code available for payment source | Admitting: Physical Medicine and Rehabilitation

## 2022-04-04 ENCOUNTER — Ambulatory Visit: Payer: Medicaid Other | Admitting: Physical Medicine and Rehabilitation

## 2022-04-04 IMAGING — MR MR HEAD W/O CM
10 of 14 series · 38 of 48 positions shown · non-contrast
Comparison: None.

CLINICAL DATA: Left-sided weakness. Cognitive deficit. Dengue
infection.

EXAM:
MRI HEAD WITHOUT CONTRAST
TECHNIQUE: Multiplanar, multiecho pulse sequences of the brain and surrounding
structures were obtained without intravenous contrast.

[Series 5: DWI · axial · 3.0mm · 1.36mm/px · z∈[-70,+81]mm · 6 of 103 slices shown (1 of 2)]
[im 1/103]
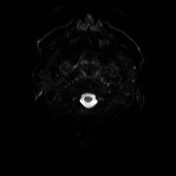
[im 21/103]
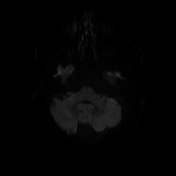
[im 41/103]
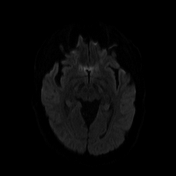
[im 62/103]
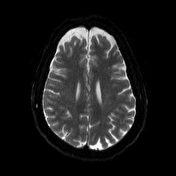
[im 82/103]
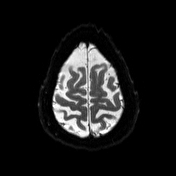
[im 103/103]
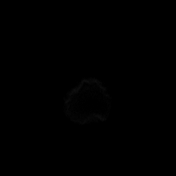

[Series 6: DWI · axial · 3.0mm · 1.36mm/px · z∈[-70,+81]mm · 3 of 52 slices shown (2 of 2)]
[im 1/52]
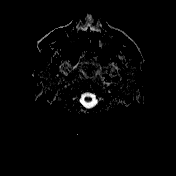
[im 26/52]
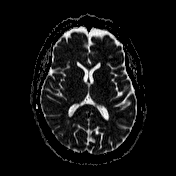
[im 52/52]
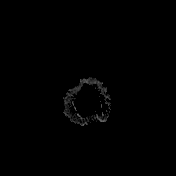

[Series 7: T1 · sagittal · 5.0mm · 0.75mm/px · 2 of 24 slices shown (1 of 2)]
[im 1/24]
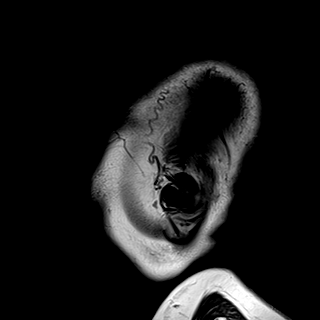
[im 24/24]
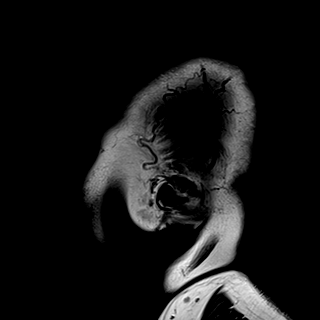

[Series 8: T2 · axial · 5.0mm · 0.62mm/px · z∈[-76,+85]mm · 2 of 26 slices shown (1 of 2)]
[im 1/26]
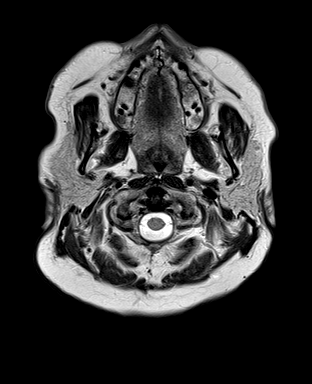
[im 26/26]
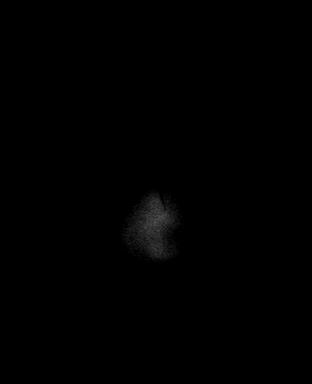

[Series 9: swi_images · axial · 3.0mm · 0.75mm/px · z∈[-83,+92]mm · 4 of 60 slices shown]
[im 1/60]
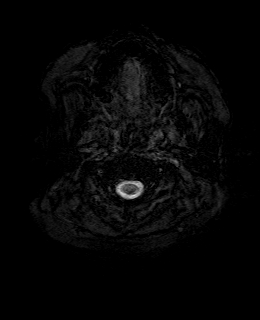
[im 20/60]
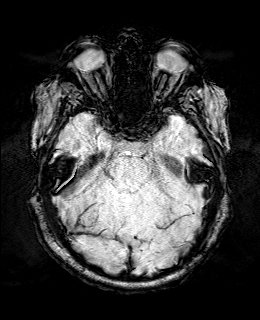
[im 40/60]
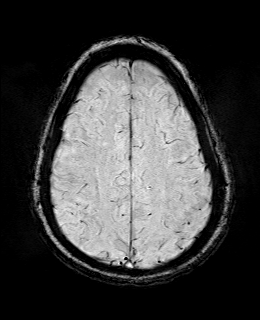
[im 60/60]
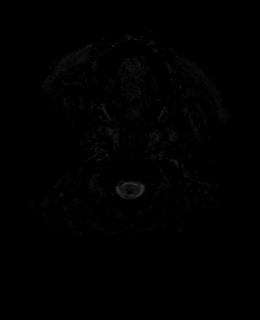

[Series 11: FLAIR · axial · 3.0mm · 0.75mm/px · z∈[-71,+80]mm · 3 of 52 slices shown]
[im 1/52]
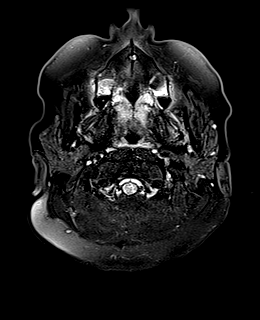
[im 26/52]
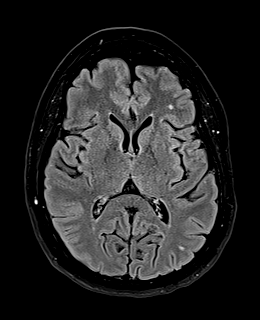
[im 52/52]
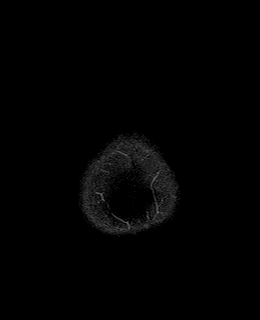

[Series 12: T1 · axial · 1.0mm · 0.94mm/px · z∈[-74,+83]mm · 10 of 160 slices shown (2 of 2)]
[im 1/160]
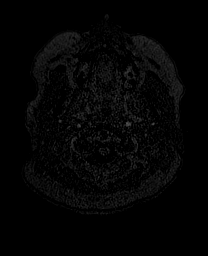
[im 18/160]
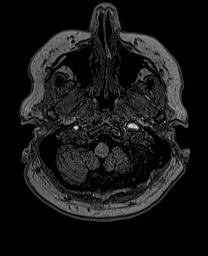
[im 36/160]
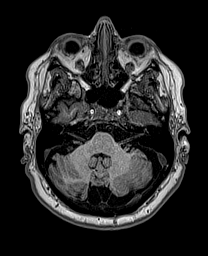
[im 54/160]
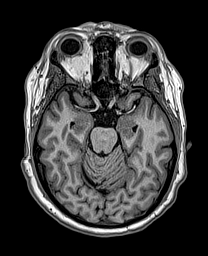
[im 71/160]
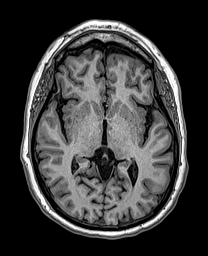
[im 89/160]
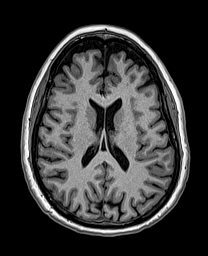
[im 107/160]
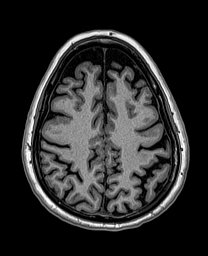
[im 124/160]
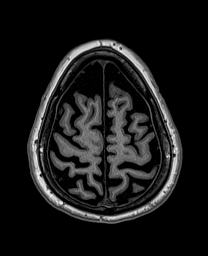
[im 142/160]
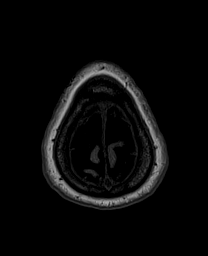
[im 160/160]
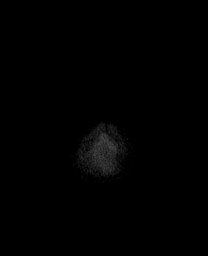

[Series 13: cor dwi_tracew · coronal · 5.0mm · 1.53mm/px · 4 of 60 slices shown]
[im 1/60]
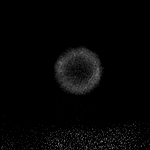
[im 20/60]
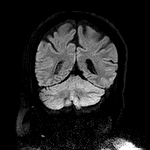
[im 40/60]
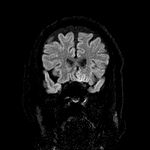
[im 60/60]
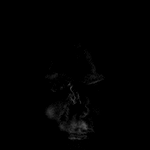

[Series 14: cor dwi_adc · coronal · 5.0mm · 1.53mm/px · 2 of 29 slices shown]
[im 1/29]
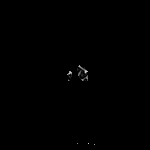
[im 29/29]
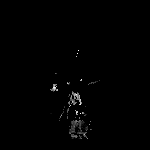

[Series 15: T2 · coronal · 5.0mm · 0.57mm/px · 2 of 36 slices shown (2 of 2)]
[im 1/36]
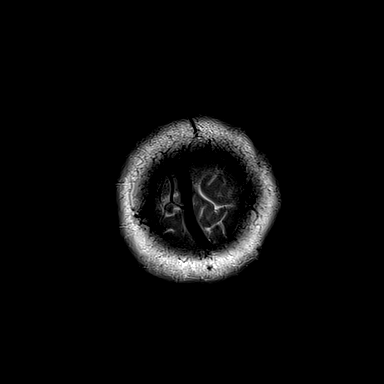
[im 36/36]
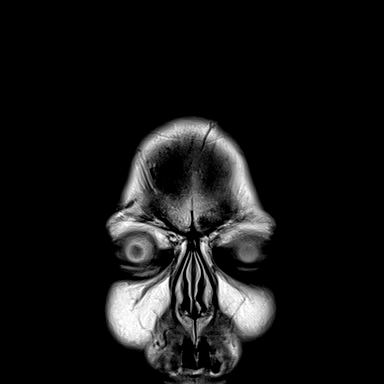

[38 of 48 positions shown; findings below may reference images not displayed]

FINDINGS: Brain: Generalized volume loss, more than often seen at this age.
Diffusion imaging does not show any acute or subacute infarction. No
focal abnormality affects the brainstem or cerebellum. Cerebral
hemispheres show scattered punctate foci of T2 FLAIR signal within
the white matter consistent with minimal small vessel change. No
cortical or large vessel territory infarction. No mass lesion,
hemorrhage, hydrocephalus or extra-axial collection.

Vascular: Major vessels at the base of the brain show flow.

Skull and upper cervical spine: Negative

Sinuses/Orbits: Clear/normal

Other: None
IMPRESSION: No acute or reversible finding. Generalized brain volume loss, more
than usually seen at this age. Few scattered punctate foci T2 and
FLAIR signal in the hemispheric white matter consistent mild chronic
small vessel change.

## 2022-04-07 ENCOUNTER — Encounter
Payer: Medicaid Other | Attending: Physical Medicine and Rehabilitation | Admitting: Physical Medicine and Rehabilitation

## 2022-04-07 ENCOUNTER — Ambulatory Visit: Payer: Medicaid Other | Admitting: Physical Medicine and Rehabilitation

## 2022-04-07 DIAGNOSIS — M7918 Myalgia, other site: Secondary | ICD-10-CM | POA: Insufficient documentation

## 2022-04-30 ENCOUNTER — Telehealth: Payer: Self-pay | Admitting: Emergency Medicine

## 2022-04-30 NOTE — Telephone Encounter (Signed)
Copied from [place] [id number]. Topic: General - Other >> Apr 30, 2022 10:38 AM Cyndi Bender wrote: Reason for CRM: Pt requests to speak with Clifton James or someone in financial assistance. Cb# [phone]

## 2022-05-01 ENCOUNTER — Ambulatory Visit: Payer: Medicaid Other | Attending: Family Medicine | Admitting: Family Medicine

## 2022-05-01 ENCOUNTER — Other Ambulatory Visit: Payer: Self-pay

## 2022-05-01 ENCOUNTER — Encounter: Payer: Self-pay | Admitting: Family Medicine

## 2022-05-01 VITALS — BP 151/93 | HR 73 | Temp 98.9°F | Ht 64.0 in | Wt 231.2 lb

## 2022-05-01 DIAGNOSIS — G894 Chronic pain syndrome: Secondary | ICD-10-CM

## 2022-05-01 DIAGNOSIS — E1165 Type 2 diabetes mellitus with hyperglycemia: Secondary | ICD-10-CM

## 2022-05-01 DIAGNOSIS — K0889 Other specified disorders of teeth and supporting structures: Secondary | ICD-10-CM

## 2022-05-01 DIAGNOSIS — M654 Radial styloid tenosynovitis [de Quervain]: Secondary | ICD-10-CM | POA: Diagnosis not present

## 2022-05-01 DIAGNOSIS — L920 Granuloma annulare: Secondary | ICD-10-CM

## 2022-05-01 DIAGNOSIS — I10 Essential (primary) hypertension: Secondary | ICD-10-CM

## 2022-05-01 LAB — POCT GLYCOSYLATED HEMOGLOBIN (HGB A1C): HbA1c, POC (controlled diabetic range): 7.2 % — AB (ref 0.0–7.0)

## 2022-05-01 LAB — GLUCOSE, POCT (MANUAL RESULT ENTRY): POC Glucose: 141 mg/dl — AB (ref 70–99)

## 2022-05-01 MED ORDER — OZEMPIC (0.25 OR 0.5 MG/DOSE) 2 MG/3ML ~~LOC~~ SOPN
0.2500 mg | PEN_INJECTOR | SUBCUTANEOUS | 0 refills | Status: DC
  Filled 2022-05-01 (×2): qty 3, 42d supply, fill #0

## 2022-05-01 MED ORDER — AMLODIPINE BESYLATE 2.5 MG PO TABS
2.5000 mg | ORAL_TABLET | Freq: Every day | ORAL | 1 refills | Status: DC
  Filled 2022-05-01: qty 90, 90d supply, fill #0

## 2022-05-01 MED ORDER — OZEMPIC (0.25 OR 0.5 MG/DOSE) 2 MG/3ML ~~LOC~~ SOPN
0.5000 mg | PEN_INJECTOR | SUBCUTANEOUS | 6 refills | Status: DC
  Filled 2022-05-01: qty 3, fill #0

## 2022-05-01 NOTE — Progress Notes (Signed)
Teeth falling out.

## 2022-05-01 NOTE — Progress Notes (Signed)
Subjective:  Patient ID: Karen Whitehead, female    DOB: [birth year]  Age: 56 y.o. MRN: [id number]  CC: Diabetes   HPI Karen Whitehead is a 56 y.o. year old female with a history of Type2 DM (A1c 6.8), anxiety and depression (managed by behavioral health), insomnia, Granuloma Annulare, cervical radiculopathy.   Interval History: A1c is 7.2 up from 6.8 previously. She never picked up Ozempic from the Pharmacy but has been adherent with her glipizide.  She is not up-to-date on annual eye exams.  Denies presence of hypoglycemia, neuropathy.  She did receive trigger point injection from physical medicine and rehab for management of cervical myofascial pain 3 months ago.  She would like a referral for water therapy she benefited from in the past. She Complains of pain in her left MCP joint radiating up the radial aspect of her left wrist, worse with moving the thumb but with no associated swelling.  Her BP at home has been elevated at home with diastolic in the 28N and systolic in the 867E, BP in the clinic is 151/93.  She is concerned because she had a nephew recently had a stroke.  Her teeth are loose and falling out and she would like to see a dentist. Past Medical History:  Diagnosis Date   Anxiety    Arthritis    Chronic kidney disease    Depression    Diabetes mellitus without complication ([place])    Granuloma annulare    Hyperlipidemia     Past Surgical History:  Procedure Laterality Date   APPENDECTOMY      Family History  Problem Relation Age of Onset   Stomach cancer Mother    Heart attack Father    Breast cancer Neg Hx    Colon cancer Neg Hx    Colon polyps Neg Hx     Social History   Socioeconomic History   Marital status: Single    Spouse name: Not on file   Number of children: 1   Years of education: Not on file   Highest education level: Some college, no degree  Occupational History   Not on file  Tobacco Use   Smoking status: Every Day    Packs/day: 0.50     Types: Cigarettes   Smokeless tobacco: Never  Vaping Use   Vaping Use: Never used  Substance and Sexual Activity   Alcohol use: No   Drug use: No   Sexual activity: Not Currently  Other Topics Concern   Not on file  Social History Narrative   Not on file   Social Determinants of Health   Financial Resource Strain: Not on file  Food Insecurity: Food Insecurity Present (03/14/2021)   Hunger Vital Sign    Worried About Running Out of Food in the Last Year: Often true    Ran Out of Food in the Last Year: Sometimes true  Transportation Needs: No Transportation Needs (03/14/2021)   PRAPARE - Hydrologist (Medical): No    Lack of Transportation (Non-Medical): No  Physical Activity: Not on file  Stress: Not on file  Social Connections: Not on file    No Known Allergies  Outpatient Medications Prior to Visit  Medication Sig Dispense Refill   atorvastatin (LIPITOR) 40 MG tablet Take 1 tablet (40 mg total) by mouth daily. 90 tablet 3   Blood Glucose Monitoring Suppl (TRUE METRIX METER) w/Device KIT 1 each by Does not apply route in the morning and at  bedtime. 1 kit 0   clonazePAM (KLONOPIN) 0.5 MG tablet Take 1 tablet (0.5 mg total) by mouth 2 (two) times daily as needed. for anxiety 60 tablet 2   cyclobenzaprine (FLEXERIL) 10 MG tablet Take 1 tablet (10 mg total) by mouth 2 (two) times daily as needed for muscle spasms as needed for muscle pain 60 tablet 6   divalproex (DEPAKOTE ER) 500 MG 24 hr tablet Take 1 tablet (500 mg total) by mouth at bedtime. 30 tablet 6   fluticasone (CUTIVATE) 0.005 % ointment Apply topically 2 times daily. 30 g 0   gabapentin (NEURONTIN) 300 MG capsule Take 1 capsule (300 mg total) by mouth 3 (three) times daily. 90 capsule 6   glipiZIDE (GLUCOTROL) 5 MG tablet TAKE 0.5 TABLETS (2.5 MG TOTAL) BY MOUTH 2 (TWO) TIMES DAILY BEFORE A MEAL. 30 tablet 6   glucose blood (TRUE METRIX BLOOD GLUCOSE TEST) test strip Use as instructed  100 each 12   HYDROcodone-acetaminophen (NORCO) 7.5-325 MG tablet Take 1 tablet by mouth 5 (five) times daily as needed for moderate pain. 120 tablet 0   ibuprofen (ADVIL) 200 MG tablet Take 200 mg by mouth every 6 (six) hours as needed for headache, mild pain or moderate pain.     lidocaine (LIDODERM) 5 % Place 2 patches onto the skin daily. Remove & Discard patch within 12 hours or as directed by MD 60 patch 11   risperiDONE (RISPERDAL) 1 MG tablet Take 1 tablet (1 mg total) by mouth 2 (two) times daily. 60 tablet 3   sertraline (ZOLOFT) 100 MG tablet TAKE 2 TABLETS BY MOUTH ONCE A DAY 60 tablet 3   SUMAtriptan (IMITREX) 50 MG tablet Take 1 tablet (50 mg total) by mouth every 2 (two) hours as needed for migraine. May repeat in 2 hours if headache persists or recurs. 12 tablet 6   topiramate (TOPAMAX) 50 MG tablet Take 1 tablet (50 mg total) by mouth 2 (two) times daily. 60 tablet 3   traZODone (DESYREL) 100 MG tablet TAKE 2 TABLETS BY MOUTH AT BEDTIME 60 tablet 3   TRUEplus Lancets 28G MISC Use as directed in the morning and at bedtime. 100 each 1   Vitamin D, Ergocalciferol, (DRISDOL) 1.25 MG (50000 UNIT) CAPS capsule Take 1 capsule (50,000 Units total) by mouth every 7 (seven) days. 7 capsule 0   Semaglutide,0.25 or 0.5MG/DOS, (OZEMPIC, 0.25 OR 0.5 MG/DOSE,) 2 MG/3ML SOPN Inject 0.25 mg into the skin once a week. 3 mL 6   albuterol (VENTOLIN HFA) 108 (90 Base) MCG/ACT inhaler INHALE 1-2 PUFFS INTO THE LUNGS EVERY 6 (SIX) HOURS AS NEEDED FOR WHEEZING OR SHORTNESS OF BREATH. (Patient taking differently: Inhale 2 puffs into the lungs every 6 (six) hours as needed for wheezing or shortness of breath.) 18 g 1   Facility-Administered Medications Prior to Visit  Medication Dose Route Frequency Provider Last Rate Last Admin   lidocaine (XYLOCAINE) 1 % (with pres) injection 3 mL  3 mL Intradermal Once Raulkar, Clide Deutscher, MD         ROS Review of Systems  Constitutional:  Negative for activity change  and appetite change.  HENT:  Positive for dental problem. Negative for sinus pressure and sore throat.   Respiratory:  Negative for chest tightness, shortness of breath and wheezing.   Cardiovascular:  Negative for chest pain and palpitations.  Gastrointestinal:  Negative for abdominal distention, abdominal pain and constipation.  Genitourinary: Negative.   Musculoskeletal:  See HPI  Skin:  Positive for rash.  Psychiatric/Behavioral:  Negative for behavioral problems and dysphoric mood.     Objective:  BP (!) 151/93   Pulse 73   Temp 98.9 F (37.2 C) (Oral)   Ht _0  (1.626 m)   Wt 231 lb 3.2 oz (104.9 kg)   SpO2 96%   BMI 39.69 kg/m      05/01/2022   11:36 AM 01/28/2022   11:45 AM 11/26/2021   10:33 AM  BP/Weight  Systolic BP 469 629 528  Diastolic BP 93 85 84  Wt. (Lbs) 231.2 232 232.4  BMI 39.69 kg/m2 39.82 kg/m2 39.89 kg/m2      Physical Exam Constitutional:      Appearance: She is well-developed.  Cardiovascular:     Rate and Rhythm: Normal rate.     Heart sounds: Normal heart sounds. No murmur heard. Pulmonary:     Effort: Pulmonary effort is normal.     Breath sounds: Normal breath sounds. No wheezing or rales.  Chest:     Chest wall: No tenderness.  Abdominal:     General: Bowel sounds are normal. There is no distension.     Palpations: Abdomen is soft. There is no mass.     Tenderness: There is no abdominal tenderness.  Musculoskeletal:     Right lower leg: No edema.     Left lower leg: No edema.     Comments: Positive Finkelstein sign in left thumb  Skin:    Findings: Rash present.  Neurological:     Mental Status: She is alert and oriented to person, place, and time.  Psychiatric:        Mood and Affect: Mood normal.        Latest Ref Rng & Units 10/31/2021   12:02 PM 12/17/2020    4:24 PM 10/16/2019    5:51 PM  CMP  Glucose 70 - 99 mg/dL 123  75    BUN 6 - 24 mg/dL 12  14    Creatinine 0.57 - 1.00 mg/dL 0.92  1.03    Sodium 134 -  144 mmol/L 142  136  139   Potassium 3.5 - 5.2 mmol/L 4.5  4.1  3.4   Chloride 96 - 106 mmol/L 101  100    CO2 20 - 29 mmol/L 25  24    Calcium 8.7 - 10.2 mg/dL 10.2  9.6    Total Protein 6.0 - 8.5 g/dL 7.4     Total Bilirubin 0.0 - 1.2 mg/dL 0.3     Alkaline Phos 44 - 121 IU/L 81     AST 0 - 40 IU/L 17     ALT 0 - 32 IU/L 22       Lipid Panel     Component Value Date/Time   CHOL 162 10/31/2021 1202   TRIG 101 10/31/2021 1202   HDL 49 10/31/2021 1202   CHOLHDL 6.0 (H) 10/31/2020 1004   LDLCALC 94 10/31/2021 1202    CBC    Component Value Date/Time   WBC 10.5 10/16/2019 1515   RBC 4.82 10/16/2019 1515   HGB 15.6 (H) 10/16/2019 1751   HCT 46.0 10/16/2019 1751   PLT 289 10/16/2019 1515   MCV 92.5 10/16/2019 1515   MCH 31.5 10/16/2019 1515   MCHC 34.1 10/16/2019 1515   RDW 12.6 10/16/2019 1515   LYMPHSABS 3.0 11/10/2018 1315   MONOABS 0.6 11/10/2018 1315   EOSABS 0.3 11/10/2018 1315   BASOSABS 0.1 11/10/2018 1315  Lab Results  Component Value Date   HGBA1C 7.2 (A) 05/01/2022    Assessment & Plan:  1. Type 2 diabetes mellitus with hyperglycemia, without long-term current use of insulin (HCC) Slightly above goal with A1c of 7.2, goal is less than 7.0 She never picked up her Ozempic and has been advised to do so today Plan is to titrate her up to maximum tolerable dose and to achieve as much weight loss as possible.  Once she moves up to 0.5 mg we can discontinue glipizide and she has been informed accordingly Clinical pharmacist called in to provide education on administration. Counseled on Diabetic diet, my plate method, 592 minutes of moderate intensity exercise/week Blood sugar logs with fasting goals of 80-120 mg/dl, random of less than 180 and in the event of sugars less than 60 mg/dl or greater than 400 mg/dl encouraged to notify the clinic. Advised on the need for annual eye exams, annual foot exams, Pneumonia vaccine. - POCT glucose (manual entry) - POCT  glycosylated hemoglobin (Hb A1C) - Ambulatory referral to Ophthalmology - Semaglutide,0.25 or 0.5MG/DOS, (OZEMPIC, 0.25 OR 0.5 MG/DOSE,) 2 MG/3ML SOPN; Inject 0.25 mg into the skin once a week. For 4 week then increase to 0.38m thereafter  Dispense: 3 mL; Refill: 0 - Semaglutide,0.25 or 0.5MG/DOS, (OZEMPIC, 0.25 OR 0.5 MG/DOSE,) 2 MG/3ML SOPN; Inject 0.5 mg into the skin once a week.  Dispense: 3 mL; Refill: 6 - CMP14+EGFR  2. Loose, teeth - Ambulatory referral to Dentistry  3. De Quervain's tenosynovitis, left She will benefit from cortisone injection and has been referred Advised to use topical NSAIDs meanwhile as well as a brace - AMB referral to orthopedics  4. Chronic pain syndrome Stable Status post trigger point injections by physical medicine and rehab Referred for aquatic therapy - Ambulatory referral to Physical Therapy  5. Primary hypertension New diagnosis Will initiate amlodipine Counseled on blood pressure goal of less than 130/80, low-sodium, DASH diet, medication compliance, 150 minutes of moderate intensity exercise per week. Discussed medication compliance, adverse effects. - amLODipine (NORVASC) 2.5 MG tablet; Take 1 tablet (2.5 mg total) by mouth daily.  Dispense: 90 tablet; Refill: 1  6. Granuloma annulare Stable Using topical steroids     Meds ordered this encounter  Medications   Semaglutide,0.25 or 0.5MG/DOS, (OZEMPIC, 0.25 OR 0.5 MG/DOSE,) 2 MG/3ML SOPN    Sig: Inject 0.25 mg into the skin once a week. For 4 week then increase to 0.546mthereafter    Dispense:  3 mL    Refill:  0   Semaglutide,0.25 or 0.5MG/DOS, (OZEMPIC, 0.25 OR 0.5 MG/DOSE,) 2 MG/3ML SOPN    Sig: Inject 0.5 mg into the skin once a week.    Dispense:  3 mL    Refill:  6   amLODipine (NORVASC) 2.5 MG tablet    Sig: Take 1 tablet (2.5 mg total) by mouth daily.    Dispense:  90 tablet    Refill:  1    Follow-up: Return in about 1 month (around 06/01/2022) for CPE/ Preventive  Health Exam, Pap smear.       EnCharlott RakesMD, FAAFP. [facility]Texas [facility] [place] 05/01/2022, 12:33 PM

## 2022-05-01 NOTE — Patient Instructions (Signed)
De Quervain's Tenosynovitis  De Quervain's tenosynovitis is a condition that causes inflammation of the tendon on the thumb side of the wrist. Tendons are cords of tissue that connect bones to muscles. The tendons in the hand pass through a tunnel called a sheath. A slippery layer of tissue (synovium) lets the tendons move smoothly in the sheath. With de Quervain's tenosynovitis, the sheath swells or thickens, causing friction and pain. The condition is also called de Quervain's disease and de Quervain's syndrome. It occurs most often in women who are 58-35 years old. What are the causes? The exact cause of this condition is not known. It may be associated with overuse of the hand and wrist. What increases the risk? You are more likely to develop this condition if you: Use your hands far more than normal, especially if you repeat certain movements that involve twisting your hand or using a tight grip. Are pregnant. Are a middle-aged woman. Have rheumatoid arthritis. Have diabetes. What are the signs or symptoms? The main symptom of this condition is pain on the thumb side of the wrist. The pain may get worse when you grasp something or turn your wrist. Other symptoms may include: Pain that extends up the forearm. Swelling of your wrist and hand. Trouble moving the thumb and wrist. A sensation of snapping in the wrist. A bump filled with fluid (cyst) in the area of the pain. How is this diagnosed? This condition may be diagnosed based on: Your symptoms and medical history. A physical exam. During the exam, your health care provider may do a simple test Wynn Maudlin test) that involves pulling your thumb and wrist to see if this causes pain. You may also need to have an X-ray or ultrasound. How is this treated? Treatment for this condition may include: Avoiding any activity that causes pain and swelling. Taking medicines. Anti-inflammatory medicines and corticosteroid injections may be used  to reduce inflammation and relieve pain. Wearing a splint. Having surgery. This may be needed if other treatments do not work. Once the pain and swelling have gone down, you may start: Physical therapy. This includes exercises to improve movement and strength in your wrist and thumb. Occupational therapy. This includes adjusting how you move your wrist. Follow these instructions at home: If you have a splint: Wear the splint as told by your health care provider. Remove it only as told by your health care provider. Loosen the splint if your fingers tingle, become numb, or turn cold and blue. Keep the splint clean. If the splint is not waterproof: Do not let it get wet. Cover it with a watertight covering when you take a bath or a shower. Managing pain, stiffness, and swelling  Avoid movements and activities that cause pain and swelling in the wrist area. If directed, put ice on the painful area. This may be helpful after doing activities that involve the sore wrist. To do this: Put ice in a plastic bag. Place a towel between your skin and the bag. Leave the ice on for 20 minutes, 2-3 times a day. Remove the ice if your skin turns bright red. This is very important. If you cannot feel pain, heat, or cold, you have a greater risk of damage to the area. Move your fingers often to reduce stiffness and swelling. Raise (elevate) the injured area above the level of your heart while you are sitting or lying down. General instructions Return to your normal activities as told by your health care provider. Ask your  health care provider what activities are safe for you. Take over-the-counter and prescription medicines only as told by your health care provider. Keep all follow-up visits. This is important. Contact a health care provider if: Your pain medicine does not help. Your pain gets worse. You develop new symptoms. Summary De Quervain's tenosynovitis is a condition that causes inflammation  of the tendon on the thumb side of the wrist. The condition occurs most often in women who are 81-82 years old. The exact cause of this condition is not known. It may be associated with overuse of the hand and wrist. Treatment starts with avoiding activity that causes pain or swelling in the wrist area. Other treatments may include wearing a splint and taking medicine. Sometimes, surgery is needed. This information is not intended to replace advice given to you by your health care provider. Make sure you discuss any questions you have with your health care provider. Document Revised: 08/03/2019 Document Reviewed: 08/03/2019 Elsevier Patient Education  [place].

## 2022-05-02 LAB — CMP14+EGFR
ALT: 25 IU/L (ref 0–32)
AST: 20 IU/L (ref 0–40)
Albumin/Globulin Ratio: 1.7 (ref 1.2–2.2)
Albumin: 4.8 g/dL (ref 3.8–4.9)
Alkaline Phosphatase: 78 IU/L (ref 44–121)
BUN/Creatinine Ratio: 9 (ref 9–23)
BUN: 8 mg/dL (ref 6–24)
Bilirubin Total: 0.4 mg/dL (ref 0.0–1.2)
CO2: 22 mmol/L (ref 20–29)
Calcium: 10.3 mg/dL — ABNORMAL HIGH (ref 8.7–10.2)
Chloride: 101 mmol/L (ref 96–106)
Creatinine, Ser: 0.85 mg/dL (ref 0.57–1.00)
Globulin, Total: 2.8 g/dL (ref 1.5–4.5)
Glucose: 111 mg/dL — ABNORMAL HIGH (ref 70–99)
Potassium: 4.6 mmol/L (ref 3.5–5.2)
Sodium: 143 mmol/L (ref 134–144)
Total Protein: 7.6 g/dL (ref 6.0–8.5)
eGFR: 81 mL/min/1.73

## 2022-05-06 ENCOUNTER — Encounter: Payer: Self-pay | Admitting: Registered Nurse

## 2022-05-06 ENCOUNTER — Encounter: Payer: Medicaid Other | Attending: Physical Medicine and Rehabilitation | Admitting: Registered Nurse

## 2022-05-06 VITALS — BP 129/84 | HR 76 | Ht 64.0 in | Wt 231.0 lb

## 2022-05-06 DIAGNOSIS — G894 Chronic pain syndrome: Secondary | ICD-10-CM

## 2022-05-06 DIAGNOSIS — Z5181 Encounter for therapeutic drug level monitoring: Secondary | ICD-10-CM | POA: Diagnosis not present

## 2022-05-06 DIAGNOSIS — M546 Pain in thoracic spine: Secondary | ICD-10-CM | POA: Insufficient documentation

## 2022-05-06 DIAGNOSIS — M7918 Myalgia, other site: Secondary | ICD-10-CM

## 2022-05-06 DIAGNOSIS — G8929 Other chronic pain: Secondary | ICD-10-CM | POA: Diagnosis present

## 2022-05-06 DIAGNOSIS — M47817 Spondylosis without myelopathy or radiculopathy, lumbosacral region: Secondary | ICD-10-CM | POA: Diagnosis present

## 2022-05-06 DIAGNOSIS — Z79899 Other long term (current) drug therapy: Secondary | ICD-10-CM

## 2022-05-06 DIAGNOSIS — M542 Cervicalgia: Secondary | ICD-10-CM | POA: Diagnosis not present

## 2022-05-06 NOTE — Progress Notes (Signed)
Subjective:    Patient ID: Karen Whitehead, female    DOB: [birth year], 57 y.o.   MRN: [id number]  HPI: Karen Whitehead is a 57 y.o. female who returns for follow up appointment for chronic pain . She  states her pain is located in  her neck, mid- back and lower back pain mainly right side, she reports increase intensity and frequency of lower back pain. In the past she received Right lumbar L2, L3, L4 medial branch blocks under fluoroscopic guidance, on 03/21/2021.   Indication: Right Lumbar pain which is not relieved by medication management or other conservative care and interfering with self-care and mobility. Dr Letta Pate note was reviewed.. She will be scheduled with Dr Letta Pate, to discussed the above. This was discussed with Dr Ranell Patrick and she agrees with the above. Also discussed the above with Dr Letta Pate.  She rates her pain 8. Her current exercise regime is walking short distances with her cane  and performing stretching exercises.She was encourage to continue with HEP as tolerated, she verbalizes understanding.   Ms. Dalia reporting her pain is not controlled with over counter medications, PMP was reviewed. Last Hydrocodone prescription was e-scribed on 01/30/2022. UDS ordered today, we reviewed narcotic policy. She verbalizes understanding.      Pain Inventory Average Pain 8 Pain Right Now 8 My pain is constant, sharp, dull, stabbing, tingling, and aching  In the last 24 hours, has pain interfered with the following? General activity 0 Relation with others 0 Enjoyment of life 0 What TIME of day is your pain at its worst? morning , daytime, evening, and night Sleep (in general) Poor  Pain is worse with: walking, bending, sitting, standing, and some activites Pain improves with: rest, heat/ice, and medication Relief from Meds: 6  Family History  Problem Relation Age of Onset   Stomach cancer Mother    Heart attack Father    Breast cancer Neg Hx    Colon cancer Neg Hx     Colon polyps Neg Hx    Social History   Socioeconomic History   Marital status: Single    Spouse name: Not on file   Number of children: 1   Years of education: Not on file   Highest education level: Some college, no degree  Occupational History   Not on file  Tobacco Use   Smoking status: Every Day    Packs/day: 0.50    Types: Cigarettes   Smokeless tobacco: Never  Vaping Use   Vaping Use: Never used  Substance and Sexual Activity   Alcohol use: No   Drug use: No   Sexual activity: Not Currently  Other Topics Concern   Not on file  Social History Narrative   Not on file   Social Determinants of Health   Financial Resource Strain: Not on file  Food Insecurity: Food Insecurity Present (03/14/2021)   Hunger Vital Sign    Worried About Running Out of Food in the Last Year: Often true    Ran Out of Food in the Last Year: Sometimes true  Transportation Needs: No Transportation Needs (03/14/2021)   PRAPARE - Hydrologist (Medical): No    Lack of Transportation (Non-Medical): No  Physical Activity: Not on file  Stress: Not on file  Social Connections: Not on file   Past Surgical History:  Procedure Laterality Date   APPENDECTOMY     Past Surgical History:  Procedure Laterality Date   APPENDECTOMY  Past Medical History:  Diagnosis Date   Anxiety    Arthritis    Chronic kidney disease    Depression    Diabetes mellitus without complication ([place])    Granuloma annulare    Hyperlipidemia    There were no vitals taken for this visit.  Opioid Risk Score:   Fall Risk Score:  `1  Depression screen [facility] 2/9     05/01/2022   11:38 AM 11/26/2021   10:46 AM 10/31/2021   11:19 AM 10/28/2021    2:59 PM 07/25/2021   10:23 AM 05/21/2021   10:47 AM 04/16/2021    1:13 PM  Depression screen PHQ 2/9  Decreased Interest 2 0 3 1 0    Down, Depressed, Hopeless 3 0 '3 1 1    '$ PHQ - 2 Score 5 0 '6 2 1    '$ Altered sleeping 3  3      Tired,  decreased energy 3  3      Change in appetite 3  3      Feeling bad or failure about yourself  3  3      Trouble concentrating 2  3      Moving slowly or fidgety/restless 1  1      Suicidal thoughts 1  2      PHQ-9 Score 21  24      Difficult doing work/chores            Information is confidential and restricted. Go to Review Flowsheets to unlock data.    Review of Systems  Musculoskeletal:  Positive for back pain.  All other systems reviewed and are negative.     Objective:   Physical Exam Vitals and nursing note reviewed.  Constitutional:      Appearance: Normal appearance.  Cardiovascular:     Rate and Rhythm: Normal rate and regular rhythm.     Pulses: Normal pulses.     Heart sounds: Normal heart sounds.  Pulmonary:     Effort: Pulmonary effort is normal.     Breath sounds: Normal breath sounds.  Musculoskeletal:     Cervical back: Normal range of motion and neck supple.     Comments: Normal Muscle Bulk and Muscle Testing Reveals:  Upper Extremities: Right: Decreased ROM 90 Degrees and Muscle Strength 5/5 Left Full ROM and Muscle Strength 5/5  Thoracic Paraspinal Tenderness: T-10-T-12 Lumbar Paraspinal Tenderness: L-3-L-5 Lower Extremities: Right: Decreased ROM and Muscle Strength 4/5 Right Lower Extremity Flexion Produces Pain into her Lumbar Left Lower Extremity: Full ROM and Muscle Strength 5/5 Arises from Chair slowly using cane for support Antalgic  Gait     Skin:    General: Skin is warm and dry.  Neurological:     Mental Status: She is alert and oriented to person, place, and time.  Psychiatric:        Mood and Affect: Mood normal.        Behavior: Behavior normal.         Assessment & Plan:  Cervicalgia: Continue HEP as Tolerated. Continue to alternate Heat and Ice Therapy. Continue to Monitor. 05/06/2022 Left Shoulder Tendonitis: No Complaints today. Continue to Alternate Ice and Heat Therapy. Continue HEP as Tolerated. Continue to Monitor.  05/06/2022 Chronic Bilateral Thoracic Back Pain: Continue HEP as Tolerated. Continue current medication regimen. Continue to Monitor. 05/06/2022 Lumbosacral Spondylosis without Myelopathy : S/P Right lumbar L2, L3, L4 medial branch blocks under fluoroscopic guidance: on 03/21/2021 .Continue to Monitor. Ms. Tunnell asked about  injection, she will be scheduled with Dr Letta Pate to discussed the above.  Chronic Pain Syndrome: UDS ordered today, awaiting results. Her Hydrocodone will be resume if UDS consistent. This was discussed with Dr Ranell Patrick and he agrees with the above.   Myofacial Pain Syndrome:Scheduled with Dr Ranell Patrick for Trigger Point Injection.  Continue Flexeril. Continue to Monitor.    F/U iwith Dr Ranell Patrick. And Dr Letta Pate

## 2022-05-08 ENCOUNTER — Encounter: Payer: Self-pay | Admitting: Sports Medicine

## 2022-05-08 ENCOUNTER — Ambulatory Visit (INDEPENDENT_AMBULATORY_CARE_PROVIDER_SITE_OTHER): Payer: Medicaid Other

## 2022-05-08 ENCOUNTER — Ambulatory Visit (INDEPENDENT_AMBULATORY_CARE_PROVIDER_SITE_OTHER): Payer: Medicaid Other | Admitting: Sports Medicine

## 2022-05-08 ENCOUNTER — Encounter: Payer: Medicaid Other | Admitting: Physical Medicine and Rehabilitation

## 2022-05-08 ENCOUNTER — Encounter: Payer: Self-pay | Admitting: Physical Medicine and Rehabilitation

## 2022-05-08 ENCOUNTER — Encounter: Payer: Self-pay | Admitting: Family Medicine

## 2022-05-08 ENCOUNTER — Other Ambulatory Visit: Payer: Self-pay

## 2022-05-08 VITALS — BP 135/73 | HR 80 | Ht 64.0 in

## 2022-05-08 DIAGNOSIS — M7918 Myalgia, other site: Secondary | ICD-10-CM | POA: Diagnosis not present

## 2022-05-08 DIAGNOSIS — M79642 Pain in left hand: Secondary | ICD-10-CM

## 2022-05-08 DIAGNOSIS — M47817 Spondylosis without myelopathy or radiculopathy, lumbosacral region: Secondary | ICD-10-CM | POA: Diagnosis not present

## 2022-05-08 DIAGNOSIS — G894 Chronic pain syndrome: Secondary | ICD-10-CM | POA: Diagnosis not present

## 2022-05-08 DIAGNOSIS — M1812 Unilateral primary osteoarthritis of first carpometacarpal joint, left hand: Secondary | ICD-10-CM

## 2022-05-08 LAB — TOXASSURE SELECT,+ANTIDEPR,UR

## 2022-05-08 MED ORDER — LIDOCAINE HCL 1 % IJ SOLN
3.0000 mL | Freq: Once | INTRAMUSCULAR | Status: AC
  Administered 2022-05-08: 3 mL

## 2022-05-08 MED ORDER — MELOXICAM 15 MG PO TABS
15.0000 mg | ORAL_TABLET | Freq: Every day | ORAL | 1 refills | Status: AC
  Filled 2022-05-08: qty 30, 30d supply, fill #0

## 2022-05-08 NOTE — Progress Notes (Signed)
Left thumb pain No history surgery  Months of pain Denies any OTC medication Does not have a brace for it  Has minimal swelling

## 2022-05-08 NOTE — Progress Notes (Signed)
Karen Whitehead - 57 y.o. female MRN [id number]  Date of birth: 1966-02-16  Office Visit Note: Visit Date: 05/08/2022 PCP: Charlott Rakes, MD Referred by: Charlott Rakes, MD  Subjective: Chief Complaint  Patient presents with   Left Wrist - Pain   HPI: Karen Whitehead is a pleasant 57 y.o. female who presents today for left thumb pain.  She has had pain for a few months now, however here recently she has had more than exacerbation of the pain.  Difficulty with gripping motions and certain movement about the thumb..  Denies any specific injury.  She is not taking any medication, no bracing.  At times she will have some swelling in this region.  Localizes more to the proximal thumb but will sometimes go distal and proximal to this area.  No numbness or tingling.  Last A1c was 7.2 on 05/01/22.  Pertinent ROS were reviewed with the patient and found to be negative unless otherwise specified above in HPI.   Assessment & Plan: Visit Diagnoses:  1. 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 Pain in left hand    Plan: Discussed with Karen Whitehead the etiology of her pain, she does have clinical and radiographic evidence of CMC joint osteoarthritis.  This has been bothering her for few months, but she has had an exacerbation of her chronic OA condition.  Impacting gripping and other activities of daily living.  Options such as oral pharmacologic therapy, home rehab, bracing, OT, injection therapy.  Through shared decision-making, she elected to proceed with prescription anti-inflammatory treatment as well as a cool comfort CMC brace of the thumb.  Take meloxicam once daily for the next 3 weeks, then as needed.  She will wear the cool comfort CMC brace during activity and at nighttime.  Did recommend ice as well for any swelling.  Would like to see how she does over the next 6 weeks and she may follow-up at that time if her pain is not  improved.  Follow-up: Return in about 6 weeks (around 06/19/2022) for if symptoms and pain not improving.   Meds & Orders: No orders of the defined types were placed in this encounter.   Orders Placed This Encounter  Procedures   XR Finger Thumb Left     Procedures: No procedures performed      Clinical History: No specialty comments available.  She reports that she has been smoking cigarettes. She has been smoking an average of .5 packs per day. She has never used smokeless tobacco.  Recent Labs    10/31/21 1123 05/01/22 1142  HGBA1C 6.8 7.2*    Objective:   Vital Signs: There were no vitals taken for this visit.  Physical Exam  Gen: Well-appearing, in no acute distress; non-toxic CV: Regular Rate. Well-perfused. Warm.  Resp: Breathing unlabored on room air; no wheezing. Psych: Fluid speech in conversation; appropriate affect; normal thought process Neuro: Sensation intact throughout. No gross coordination deficits.   Ortho Exam - Left thumb: Positive TTP over the proximal phalanx of the thumb near the [facility] joint.  There is some mild overlying soft tissue swelling without erythema or warmth.  There are heloma annular lesions over bilateral wrist. + CMC grind test.  There is no laxity with UCL testing.  Neurovascular intact distally.  Equivocal Finkelstein's testing.  Imaging: XR Finger Thumb Left  Result Date: 05/08/2022 3 views of the left thumb including AP lateral, oblique views were ordered and reviewed by  myself.  X-rays demonstrate mild to moderate CMC joint arthritis with some mild sclerosis.  Neutral ulnar variance.  No acute fracture noted.   Past Medical/Family/Surgical/Social History: Medications & Allergies reviewed per EMR, new medications updated. 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   History of dengue 11/03/2019   History of Legionnaire's disease 11/03/2019   Past Medical History:  Diagnosis Date   Anxiety    Arthritis    Chronic kidney disease    Depression    Diabetes mellitus without complication ([place])    Granuloma annulare    Hyperlipidemia    Family History  Problem Relation Age of Onset   Stomach cancer Mother    Heart attack Father    Breast cancer Neg Hx    Colon cancer Neg Hx    Colon polyps Neg Hx    Past Surgical History:  Procedure Laterality Date   APPENDECTOMY     Social History   Occupational History   Not on file  Tobacco Use   Smoking status: Every Day    Packs/day: 0.50    Types: Cigarettes   Smokeless tobacco: Never  Vaping Use   Vaping Use: Never used  Substance and Sexual Activity   Alcohol use: No   Drug use: No   Sexual activity: Not Currently

## 2022-05-08 NOTE — Progress Notes (Signed)
Trigger Point Injection  Indication: Lumbar myofascial pain not relieved by medication management and other conservative care.  Informed consent was obtained after describing risk and benefits of the procedure with the patient, this includes bleeding, bruising, infection and medication side effects.  The patient wishes to proceed and has given written consent.  The patient was placed in a seated position.  The area of pain was marked and prepped with Betadine.  It was entered with a 25-gauge 1/2 inch needle and a total of 5 mL of 1% lidocaine and normal saline was injected into a total of 4 trigger points, after negative draw back for blood.  The patient tolerated the procedure well.  Post procedure instructions were given.     

## 2022-05-09 ENCOUNTER — Other Ambulatory Visit: Payer: Self-pay

## 2022-05-09 ENCOUNTER — Telehealth: Payer: Self-pay | Admitting: Registered Nurse

## 2022-05-09 MED ORDER — HYDROCODONE-ACETAMINOPHEN 5-325 MG PO TABS: 1.0000 | ORAL_TABLET | Freq: Three times a day (TID) | ORAL | 0 refills | Status: DC | PRN

## 2022-05-09 NOTE — Telephone Encounter (Signed)
UDS was reviewed.  RX: Hydrocodone. Call placed to Ms. Faria, she verbalizes understanding.

## 2022-05-27 ENCOUNTER — Ambulatory Visit (HOSPITAL_BASED_OUTPATIENT_CLINIC_OR_DEPARTMENT_OTHER): Payer: Medicaid Other | Admitting: Physical Therapy

## 2022-05-28 ENCOUNTER — Other Ambulatory Visit (HOSPITAL_COMMUNITY)
Admission: RE | Admit: 2022-05-28 | Discharge: 2022-05-28 | Disposition: A | Discharge status: Home / Self Care | Payer: Medicaid Other | Source: Ambulatory Visit | Attending: Family Medicine | Admitting: Family Medicine

## 2022-05-28 ENCOUNTER — Encounter: Payer: Self-pay | Admitting: Family Medicine

## 2022-05-28 ENCOUNTER — Other Ambulatory Visit: Payer: Self-pay

## 2022-05-28 ENCOUNTER — Other Ambulatory Visit: Payer: Self-pay | Admitting: Family Medicine

## 2022-05-28 ENCOUNTER — Ambulatory Visit: Payer: Medicaid Other | Attending: Family Medicine | Admitting: Family Medicine

## 2022-05-28 ENCOUNTER — Other Ambulatory Visit (HOSPITAL_COMMUNITY): Payer: Self-pay | Admitting: Psychiatry

## 2022-05-28 VITALS — BP 121/82 | HR 87 | Ht 64.0 in | Wt 229.0 lb

## 2022-05-28 DIAGNOSIS — F17210 Nicotine dependence, cigarettes, uncomplicated: Secondary | ICD-10-CM

## 2022-05-28 DIAGNOSIS — R29818 Other symptoms and signs involving the nervous system: Secondary | ICD-10-CM | POA: Diagnosis not present

## 2022-05-28 DIAGNOSIS — I10 Essential (primary) hypertension: Secondary | ICD-10-CM

## 2022-05-28 DIAGNOSIS — Z0001 Encounter for general adult medical examination with abnormal findings: Secondary | ICD-10-CM

## 2022-05-28 DIAGNOSIS — F3181 Bipolar II disorder: Secondary | ICD-10-CM

## 2022-05-28 DIAGNOSIS — Z124 Encounter for screening for malignant neoplasm of cervix: Secondary | ICD-10-CM | POA: Insufficient documentation

## 2022-05-28 DIAGNOSIS — F411 Generalized anxiety disorder: Secondary | ICD-10-CM

## 2022-05-28 DIAGNOSIS — E1165 Type 2 diabetes mellitus with hyperglycemia: Secondary | ICD-10-CM

## 2022-05-28 DIAGNOSIS — Z1231 Encounter for screening mammogram for malignant neoplasm of breast: Secondary | ICD-10-CM

## 2022-05-28 MED ORDER — OZEMPIC (0.25 OR 0.5 MG/DOSE) 2 MG/3ML ~~LOC~~ SOPN
0.5000 mg | PEN_INJECTOR | SUBCUTANEOUS | 3 refills | Status: DC
  Filled 2022-05-28: qty 3, 28d supply, fill #0
  Filled 2022-07-28: qty 3, 28d supply, fill #1
  Filled 2022-08-22: qty 3, 28d supply, fill #0
  Filled 2022-10-06 – 2022-10-07 (×2): qty 3, 28d supply, fill #1

## 2022-05-28 MED ORDER — AMLODIPINE BESYLATE 5 MG PO TABS
5.0000 mg | ORAL_TABLET | Freq: Every day | ORAL | 1 refills | Status: DC
  Filled 2022-05-28 – 2022-08-22 (×3): qty 90, 90d supply, fill #0

## 2022-05-28 NOTE — Progress Notes (Signed)
Subjective:  Patient ID: Karen Whitehead, female    DOB: October 17, 1965  Age: 57 y.o. MRN: [id number]  CC: Annual Exam   HPI Karen Whitehead is a 57 y.o. year old female with a history of Type2 DM (A1c 6.8), anxiety and depression (managed by behavioral health), insomnia, Granuloma Annulare, cervical radiculopathy, neck low back pain.    Interval History: She presents today for complete physical exam. She is up-to-date on colonoscopy from 05/2021 which was normal with repeat recommended in 10 years.  She is due for mammogram.  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  She is status post colposcopy. Since then she has not had a repeat Pap smear.  She would like to be tested for sleep apnea. Snores, wakes up with headaches she has insomnia on some nights and is  on Trazodone which she does not like to take a lot.  Blood pressure is better controlled now that she is doubling up on her amlodipine from 2.5 mg to 5 mg. Past Medical History:  Diagnosis Date   Anxiety    Arthritis    Chronic kidney disease    Depression    Diabetes mellitus without complication ([place])    Granuloma annulare    Hyperlipidemia     Past Surgical History:  Procedure Laterality Date   APPENDECTOMY      Family History  Problem Relation Age of Onset   Stomach cancer Mother    Heart attack Father    Breast cancer Neg Hx    Colon cancer Neg Hx    Colon polyps Neg Hx     Social History   Socioeconomic History   Marital status: Single    Spouse name: Not on file   Number of children: 1   Years of education: Not on file   Highest education level: Some college, no degree  Occupational History   Not on file  Tobacco Use   Smoking status: Every Day    Packs/day: 0.50    Types: Cigarettes   Smokeless tobacco: Never  Vaping Use   Vaping Use: Never used  Substance and Sexual Activity   Alcohol use: No   Drug use: No   Sexual activity:  Not Currently  Other Topics Concern   Not on file  Social History Narrative   Not on file   Social Determinants of Health   Financial Resource Strain: Not on file  Food Insecurity: Food Insecurity Present (03/14/2021)   Hunger Vital Sign    Worried About Running Out of Food in the Last Year: Often true    Ran Out of Food in the Last Year: Sometimes true  Transportation Needs: No Transportation Needs (03/14/2021)   PRAPARE - Hydrologist (Medical): No    Lack of Transportation (Non-Medical): No  Physical Activity: Not on file  Stress: Not on file  Social Connections: Not on file    No Known Allergies  Outpatient Medications Prior to Visit  Medication Sig Dispense Refill   atorvastatin (LIPITOR) 40 MG tablet Take 1 tablet (40 mg total) by mouth daily. 90 tablet 3   Blood Glucose Monitoring Suppl (TRUE METRIX METER) w/Device KIT 1 each by Does not apply route in the morning and at bedtime. 1 kit 0   clonazePAM (KLONOPIN) 0.5 MG tablet Take 1 tablet (0.5 mg total) by mouth 2 (two) times daily as needed. for anxiety 60  tablet 2   cyclobenzaprine (FLEXERIL) 10 MG tablet Take 1 tablet (10 mg total) by mouth 2 (two) times daily as needed for muscle spasms as needed for muscle pain 60 tablet 6   divalproex (DEPAKOTE ER) 500 MG 24 hr tablet Take 1 tablet (500 mg total) by mouth at bedtime. 30 tablet 6   fluticasone (CUTIVATE) 0.005 % ointment Apply topically 2 times daily. 30 g 0   gabapentin (NEURONTIN) 300 MG capsule Take 1 capsule (300 mg total) by mouth 3 (three) times daily. 90 capsule 6   glipiZIDE (GLUCOTROL) 5 MG tablet TAKE 0.5 TABLETS (2.5 MG TOTAL) BY MOUTH 2 (TWO) TIMES DAILY BEFORE A MEAL. 30 tablet 6   glucose blood (TRUE METRIX BLOOD GLUCOSE TEST) test strip Use as instructed 100 each 12   HYDROcodone-acetaminophen (NORCO/VICODIN) 5-325 MG tablet Take 1 tablet by mouth 3 (three) times daily as needed for moderate pain. 90 tablet 0   ibuprofen  (ADVIL) 200 MG tablet Take 200 mg by mouth every 6 (six) hours as needed for headache, mild pain or moderate pain.     lidocaine (LIDODERM) 5 % Place 2 patches onto the skin daily. Remove & Discard patch within 12 hours or as directed by MD 60 patch 11   meloxicam (MOBIC) 15 MG tablet Take 1 tablet (15 mg total) by mouth daily. 30 tablet 1   risperiDONE (RISPERDAL) 1 MG tablet Take 1 tablet (1 mg total) by mouth 2 (two) times daily. 60 tablet 3   Semaglutide,0.25 or 0.'5MG'$ /DOS, (OZEMPIC, 0.25 OR 0.5 MG/DOSE,) 2 MG/3ML SOPN Inject 0.25 mg into the skin once a week. For 4 week then increase to 0.'5mg'$  thereafter 3 mL 0   Semaglutide,0.25 or 0.'5MG'$ /DOS, (OZEMPIC, 0.25 OR 0.5 MG/DOSE,) 2 MG/3ML SOPN Inject 0.5 mg into the skin once a week. 3 mL 6   sertraline (ZOLOFT) 100 MG tablet TAKE 2 TABLETS BY MOUTH ONCE A DAY 60 tablet 3   SUMAtriptan (IMITREX) 50 MG tablet Take 1 tablet (50 mg total) by mouth every 2 (two) hours as needed for migraine. May repeat in 2 hours if headache persists or recurs. 12 tablet 6   topiramate (TOPAMAX) 50 MG tablet Take 1 tablet (50 mg total) by mouth 2 (two) times daily. 60 tablet 3   traZODone (DESYREL) 100 MG tablet TAKE 2 TABLETS BY MOUTH AT BEDTIME 60 tablet 3   TRUEplus Lancets 28G MISC Use as directed in the morning and at bedtime. 100 each 1   Vitamin D, Ergocalciferol, (DRISDOL) 1.25 MG (50000 UNIT) CAPS capsule Take 1 capsule (50,000 Units total) by mouth every 7 (seven) days. 7 capsule 0   amLODipine (NORVASC) 2.5 MG tablet Take 1 tablet (2.5 mg total) by mouth daily. 90 tablet 1   albuterol (VENTOLIN HFA) 108 (90 Base) MCG/ACT inhaler INHALE 1-2 PUFFS INTO THE LUNGS EVERY 6 (SIX) HOURS AS NEEDED FOR WHEEZING OR SHORTNESS OF BREATH. (Patient taking differently: Inhale 2 puffs into the lungs every 6 (six) hours as needed for wheezing or shortness of breath.) 18 g 1   Facility-Administered Medications Prior to Visit  Medication Dose Route Frequency Provider Last Rate  Last Admin   lidocaine (XYLOCAINE) 1 % (with pres) injection 3 mL  3 mL Intradermal Once Raulkar, Clide Deutscher, MD         ROS Review of Systems  Constitutional:  Negative for activity change and appetite change.  HENT:  Negative for sinus pressure and sore throat.   Respiratory:  Negative for  chest tightness, shortness of breath and wheezing.   Cardiovascular:  Negative for chest pain and palpitations.  Gastrointestinal:  Negative for abdominal distention, abdominal pain and constipation.  Genitourinary: Negative.   Musculoskeletal:  Positive for back pain.  Psychiatric/Behavioral:  Negative for behavioral problems and dysphoric mood.     Objective:  BP 121/82   Pulse 87   Ht '5\' 4"'$  (1.626 m)   Wt 229 lb (103.9 kg)   SpO2 96%   BMI 39.31 kg/m      05/28/2022    2:32 PM 05/08/2022   11:33 AM 05/06/2022    9:36 AM  BP/Weight  Systolic BP 063 016 010  Diastolic BP 82 73 84  Wt. (Lbs) 229  231  BMI 39.31 kg/m2  39.65 kg/m2      Physical Exam Constitutional:      Appearance: She is well-developed.  Cardiovascular:     Rate and Rhythm: Normal rate.     Heart sounds: Normal heart sounds. No murmur heard. Pulmonary:     Effort: Pulmonary effort is normal.     Breath sounds: Normal breath sounds. No wheezing or rales.  Chest:     Chest wall: No tenderness.  Abdominal:     General: Bowel sounds are normal. There is no distension.     Palpations: Abdomen is soft. There is no mass.     Tenderness: There is no abdominal tenderness.  Genitourinary:    Comments: External genitalia, vagina, cervix-normal No CMT Musculoskeletal:     Right lower leg: No edema.     Left lower leg: No edema.     Comments: Lumbar spine tenderness on range of motion  Skin:    Comments: Large patches diffusely distributed in entire body surface  Neurological:     Mental Status: She is alert and oriented to person, place, and time.  Psychiatric:        Mood and Affect: Mood normal.         Latest Ref Rng & Units 05/01/2022   12:21 PM 10/31/2021   12:02 PM 12/17/2020    4:24 PM  CMP  Glucose 70 - 99 mg/dL 111  123  75   BUN 6 - 24 mg/dL '8  12  14   '$ Creatinine 0.57 - 1.00 mg/dL 0.85  0.92  1.03   Sodium 134 - 144 mmol/L 143  142  136   Potassium 3.5 - 5.2 mmol/L 4.6  4.5  4.1   Chloride 96 - 106 mmol/L 101  101  100   CO2 20 - 29 mmol/L '22  25  24   '$ Calcium 8.7 - 10.2 mg/dL 10.3  10.2  9.6   Total Protein 6.0 - 8.5 g/dL 7.6  7.4    Total Bilirubin 0.0 - 1.2 mg/dL 0.4  0.3    Alkaline Phos 44 - 121 IU/L 78  81    AST 0 - 40 IU/L 20  17    ALT 0 - 32 IU/L 25  22      Lipid Panel     Component Value Date/Time   CHOL 162 10/31/2021 1202   TRIG 101 10/31/2021 1202   HDL 49 10/31/2021 1202   CHOLHDL 6.0 (H) 10/31/2020 1004   LDLCALC 94 10/31/2021 1202    CBC    Component Value Date/Time   WBC 10.5 10/16/2019 1515   RBC 4.82 10/16/2019 1515   HGB 15.6 (H) 10/16/2019 1751   HCT 46.0 10/16/2019 1751   PLT 289 10/16/2019  1515   MCV 92.5 10/16/2019 1515   MCH 31.5 10/16/2019 1515   MCHC 34.1 10/16/2019 1515   RDW 12.6 10/16/2019 1515   LYMPHSABS 3.0 11/10/2018 1315   MONOABS 0.6 11/10/2018 1315   EOSABS 0.3 11/10/2018 1315   BASOSABS 0.1 11/10/2018 1315    Lab Results  Component Value Date   HGBA1C 7.2 (A) 05/01/2022    Assessment & Plan:  1. Annual visit for general adult medical examination with abnormal findings Counseled on 150 minutes of exercise per week, healthy eating (including decreased daily intake of saturated fats, cholesterol, added sugars, sodium),routine healthcare maintenance.   2. Suspected sleep apnea - Split night study  3. Primary hypertension Controlled Counseled on blood pressure goal of less than 130/80, low-sodium, DASH diet, medication compliance, 150 minutes of moderate intensity exercise per week. Discussed medication compliance, adverse effects. - amLODipine (NORVASC) 5 MG tablet; Take 1 tablet (5 mg total) by mouth daily.   Dispense: 90 tablet; Refill: 1  4. Encounter for screening mammogram for malignant neoplasm of breast - MM 3D SCREEN BREAST BILATERAL; Future  5. Screening for cervical cancer Previous history of LSIL in 2022 status post colposcopy - Cytology - PAP    Meds ordered this encounter  Medications   amLODipine (NORVASC) 5 MG tablet    Sig: Take 1 tablet (5 mg total) by mouth daily.    Dispense:  90 tablet    Refill:  1    Dose increase    Follow-up: Return in about 6 months (around 11/26/2022) for Chronic medical conditions.       Charlott Rakes, MD, FAAFP. [facility] [place], [place]   05/28/2022, 3:10 PM

## 2022-05-28 NOTE — Patient Instructions (Signed)
This is the Dentist you were referred to, please call for an appointment: Dr Barbaraann Share [facility] PH# [phone] Fax [phone] Address [address], [place], [place] [address] Medicaid

## 2022-06-02 ENCOUNTER — Other Ambulatory Visit: Payer: Self-pay

## 2022-06-04 LAB — CYTOLOGY - PAP
Adequacy: ABSENT
Comment: NEGATIVE
Diagnosis: NEGATIVE
High risk HPV: NEGATIVE

## 2022-06-06 ENCOUNTER — Encounter: Payer: Medicaid Other | Admitting: Physical Medicine & Rehabilitation

## 2022-06-06 ENCOUNTER — Telehealth: Payer: Self-pay | Admitting: Registered Nurse

## 2022-06-06 MED ORDER — HYDROCODONE-ACETAMINOPHEN 5-325 MG PO TABS: 1.0000 | ORAL_TABLET | Freq: Four times a day (QID) | ORAL | 0 refills | Status: DC | PRN

## 2022-06-06 NOTE — Telephone Encounter (Signed)
PMP was Reviewed. Hydrocodone e-scribed to pharmacy. She has a scheduled appointment with Dr Ranell Patrick this month.  Karen Whitehead is aware via My- Chart message.

## 2022-06-09 ENCOUNTER — Other Ambulatory Visit: Payer: Self-pay

## 2022-06-09 ENCOUNTER — Other Ambulatory Visit: Payer: Self-pay | Admitting: Physical Medicine and Rehabilitation

## 2022-06-09 MED ORDER — HYDROCODONE-ACETAMINOPHEN 5-325 MG PO TABS
1.0000 | ORAL_TABLET | Freq: Four times a day (QID) | ORAL | 0 refills | Status: DC | PRN
  Filled 2022-06-09 – 2022-07-03 (×2): qty 110, 28d supply, fill #0

## 2022-06-10 ENCOUNTER — Ambulatory Visit: Payer: Medicaid Other | Admitting: Physical Medicine & Rehabilitation

## 2022-06-17 ENCOUNTER — Encounter
Payer: Medicaid Other | Attending: Physical Medicine and Rehabilitation | Admitting: Physical Medicine and Rehabilitation

## 2022-06-17 ENCOUNTER — Ambulatory Visit: Payer: Medicaid Other | Admitting: Physical Medicine & Rehabilitation

## 2022-06-17 VITALS — BP 125/84 | HR 87 | Ht 64.0 in | Wt 222.0 lb

## 2022-06-17 DIAGNOSIS — G8929 Other chronic pain: Secondary | ICD-10-CM | POA: Diagnosis present

## 2022-06-17 DIAGNOSIS — M7918 Myalgia, other site: Secondary | ICD-10-CM | POA: Insufficient documentation

## 2022-06-17 DIAGNOSIS — M546 Pain in thoracic spine: Secondary | ICD-10-CM | POA: Diagnosis present

## 2022-06-17 DIAGNOSIS — M545 Low back pain, unspecified: Secondary | ICD-10-CM | POA: Diagnosis present

## 2022-06-17 MED ORDER — SODIUM CHLORIDE (PF) 0.9 % IJ SOLN
2.0000 mL | Freq: Every day | INTRAMUSCULAR | Status: AC
  Administered 2022-06-17 – 2022-07-24 (×2): 2 mL via INTRAVENOUS

## 2022-06-17 MED ORDER — LIDOCAINE HCL 1 % IJ SOLN
3.0000 mL | Freq: Once | INTRAMUSCULAR | Status: AC
  Administered 2022-06-17: 3 mL

## 2022-06-17 NOTE — Progress Notes (Signed)
Trigger Point Injection  Indication: Lumbar myofascial pain not relieved by medication management and other conservative care.  Informed consent was obtained after describing risk and benefits of the procedure with the patient, this includes bleeding, bruising, infection and medication side effects.  The patient wishes to proceed and has given written consent.  The patient was placed in a seated position.  The area of pain was marked and prepped with Betadine.  It was entered with a 25-gauge 1/2 inch needle and a total of 5 mL of 1% lidocaine and normal saline was injected into a total of 4 trigger points, after negative draw back for blood.  The patient tolerated the procedure well.  Post procedure instructions were given.

## 2022-06-17 NOTE — Progress Notes (Signed)
Subjective:    Patient ID: Karen Whitehead, female    DOB: 1966-04-27, 57 y.o.   MRN: [id number]  HPI .   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  -she gets lots of headaches -reviewed her hip and knee XRs with her: negative -she would like to get XRs of her spine as well.  -her hands and muscles throb so baldy that she cannot fall asleep at night -she takes one muscle relaxer in the morning -she used to be very active and takes girls trips to the beach.  -in tears today when she heard we had to reschedule her appointment for tomorrow because she was hoping to at least get some relief from the trigger point injections -she can barely get out of bed without assistance due to the severity of pain in the muscles of her back -she does not want to try any more muscle relaxers as these do not help enough -she has never tried tylenol with codeine.  -she does not want to increase the amitriptyline further   2) Insomnia: -she currently takes trazodone and sertraline.  -she has not found relief with the amitriptyline 63m.  -her pain keeps her awake  3) memory loss -has not worked with SLP before -does not follow with neurology -discussed results of her MRI brain with her- shows degeneration atypical for her age but no acute processes.   4) Rash- has to wear everything shortsleeve  5) Lumbar DDD: -she cannot handle hte pain -pain is most severe in right lower  -she finds great relief from the hydrocodone- it helps her to function better -muscle relaxers don't help -she  loves aquatic therapy- the take their time and are trying to build her muscles -she has an EMG/NCS scheduled -she wants a medicine to help her get through the pain today.  -she has tried ibuprofen and was was told not to because of her kidneys -had ESI and could not walk after -still has pain in her lower back where her pain is the worst -pain does not radiate into the legs/ -the trigger point injections helped for 12-13 days -her son heats rags and lays these on her back  6) Fall: -she fell sideways on her elbow when she could not judge the distance appropriately. -she fell on her knee recently.  -she is going to be seeing an orthopedic doctor  7) Vision goes in and out -sometimes she can see the TV and sometimes she can't   8) Thoracic spine pain -she feels large breasts contribute to this  Diet: her son usually cooks her meals for her.    Pain Inventory Average Pain 9 Pain Right Now 9 My pain is constant, sharp, stabbing, tingling and aching  In the last 24 hours, has pain interfered with the following? General activity 10 Relation with others 10 Enjoyment of life 10 What TIME of day is your pain at its worst? morning , daytime, evening and night Sleep (in general) Poor  Pain is worse with: walking, bending, inactivity and standing Pain improves with:  medication, injections Relief from Meds: 3  use a walker how many minutes can you walk? 5 ability to climb steps?  yes do you drive?  no  not employed: date last employed . I need assistance with the following:  dressing, bathing, household duties and shopping  bladder control problems weakness numbness tingling trouble walking spasms dizziness confusion depression anxiety suicidal thoughts        Family History  Problem Relation Age of Onset   Stomach cancer Mother    Heart attack Father    Breast cancer Neg Hx    Colon cancer Neg Hx    Colon polyps Neg Hx    Social History   Socioeconomic  History   Marital status: Single    Spouse name: Not on file   Number of children: 1   Years of education: Not on file   Highest education level: Some college, no degree  Occupational History   Not on file  Tobacco Use   Smoking status: Every Day    Packs/day: 0.50    Types: Cigarettes   Smokeless tobacco: Never  Vaping Use   Vaping Use: Never used  Substance and Sexual Activity   Alcohol use: No   Drug use: No   Sexual activity: Not Currently  Other Topics Concern   Not on file  Social History Narrative   Not on file   Social Determinants of Health   Financial Resource Strain: Not on file  Food Insecurity: Food Insecurity Present (03/14/2021)   Hunger Vital Sign    Worried About Running Out of Food in the Last Year: Often true    Ran Out of Food in the Last Year: Sometimes true  Transportation Needs: No Transportation Needs (03/14/2021)   PRAPARE - Hydrologist (Medical): No    Lack of Transportation (Non-Medical): No  Physical Activity: Not on file  Stress: Not on file  Social Connections: Not on file   Past Surgical History:  Procedure Laterality Date   APPENDECTOMY     Past Medical History:  Diagnosis Date   Anxiety    Arthritis    Chronic kidney disease    Depression    Diabetes mellitus without complication ([facility])    Granuloma annulare    Hyperlipidemia    BP 125/84   Pulse 87   Ht 5' 4"$  (1.626 m)   Wt 222 lb (100.7 kg)   SpO2 95%   BMI 38.11 kg/m   Opioid Risk Score:   Fall Risk Score:  `1  Depression screen [facility] 2/9     05/08/2022   11:34 AM 05/06/2022    9:39 AM 05/01/2022   11:38 AM 11/26/2021   10:46 AM 10/31/2021   11:19 AM 10/28/2021    2:59 PM 07/25/2021   10:23 AM  Depression screen PHQ 2/9  Decreased Interest 0 0 2 0 3 1 0  Down, Depressed, Hopeless 0 0 3 0 3 1 1  $ PHQ - 2 Score 0 0 5 0 6 2 1  $ Altered sleeping   3  3    Tired, decreased energy   3  3    Change in appetite   3  3    Feeling bad or  failure about yourself    3  3    Trouble concentrating   2  3    Moving slowly or fidgety/restless   1  1    Suicidal thoughts   1  2  PHQ-9 Score   21  24      Review of Systems  Constitutional:  Positive for appetite change.  Respiratory:  Positive for cough and shortness of breath.   Gastrointestinal:  Positive for constipation.  Musculoskeletal:  Positive for back pain, gait problem and neck pain.       Left hip and knee pain Pain in back of legs  Neurological:  Positive for dizziness, weakness and numbness.       Tongling  Psychiatric/Behavioral:  Positive for confusion, dysphoric mood and suicidal ideas. The patient is nervous/anxious.   All other systems reviewed and are negative.      Objective:   Physical Exam Gen: no distress, normal appearing HEENT: oral mucosa pink and moist, NCAT Cardio: Reg rate Chest: normal effort, normal rate of breathing Abd: soft, non-distended Ext: no edema Psych: pleasant, normal affect Skin: intact Neuro: Alert and oriented x3    Assessment & Plan:  1)Chronic pain syndrome and fatigue secondary to myalgias and arthralgias following Dengue fever -Discussed current symptoms of pain and history of pain.  -Discussed benefits of exercise in reducing pain. -discussed that XRs of hip and knees are normal -ordered XRs of spine as well. -continue hydrocodone, can consider increasing frequency to 5 tabs per day when she is due for next refill.  -discussed tolerance and addiction, she does experience tolerance  -continue water aerobics -asked staff to schedule appointment for her tomorrow at 8:20am for trigger point injections -discussed stronger muscle relaxer such as tizanidine but she does not want to try this -discussed increasing amitriptyline dose further but she does not want to try this -discussed coming into clinic for urine sample/signing pain contract in order to received controlled medications.  -cannot increase meloxicam  further given risk of GI bleeding.  -cannot use cymbalta due to risk of bleed.  -discussed risks and of tylenol and NSAIDs.  -prescribed lidocaine patches.  -discussed spinal cord stimulator -Discussed Qutenza as an option for neuropathic pain control. Discussed that this is a capsaicin patch, stronger than capsaicin cream. Discussed that it is currently approved for diabetic peripheral neuropathy and post-herpetic neuralgia, but that it has also shown benefit in treating other forms of neuropathy. Provided patient with link to site to learn more about the patch: Cinema[employer].fr. Discussed that the patch would be placed in office and benefits usually last 3 months. Discussed that unintended exposure to capsaicin can cause severe irritation of eyes, mucous membranes, respiratory tract, and skin, but that Qutenza is a local treatment and does not have the systemic side effects of other nerve medications. Discussed that there may be pain, itching, erythema, and decreased sensory function associated with the application of Qutenza. Side effects usually subside within 1 week. A cold pack of analgesic medications can help with these side effects. Blood pressure can also be increased due to pain associated with administration of the patch.  -tylenol does not help -referred to [facility] to discuss spinal cord stimulator -Provided with a pain relief journal and discussed that it contains foods and lifestyle tips to naturally help to improve pain. Discussed that these lifestyle strategies are also very good for health unlike some medications which can have negative side effects. Discussed that the act of keeping a journal can be therapeutic and helpful to realize patterns what helps to trigger and alleviate pain.   -lidocaine patches do help -increase gabapentin to 397m TID -Discussed current symptoms of pain and history of pain.  -Discussed benefits of  exercise in reducing pain. -Discussed  following foods that may reduce pain: 1) Ginger (especially studied for arthritis)- reduce leukotriene production to decrease inflammation 2) Blueberries- high in phytonutrients that decrease inflammation 3) Salmon- marine omega-3s reduce joint swelling and pain 4) Pumpkin seeds- reduce inflammation 5) dark chocolate- reduces inflammation 6) turmeric- reduces inflammation 7) tart cherries - reduce pain and stiffness 8) extra virgin olive oil - its compound olecanthal helps to block prostaglandins  9) chili peppers- can be eaten or applied topically via capsaicin 10) mint- helpful for headache, muscle aches, joint pain, and itching 11) garlic- reduces inflammation  Link to further information on diet for chronic pain: http://www.randall.com/   2) Insomnia:  -Amitripyline discontinued by another doctor.  -seroquel produced hallucinations.   3) Left sided weakness/memory loss -MRI brain ordered to assess for possible contributory neurological process: reviewed results with her: shows degeneration atypical for her age without acute process -referred to neurology -restart 1TB coconut oil daily, discussed may feel nausea at first, goal to increase to 4 TB over time as tolerated  4) Anxiety: discussed that her clonopin has to be filled by her psychiatry or internal medicine.   5) Myofascial pain: trigger point injections performed today -continue muscle relaxers.   6) Lumbar disc degeneration with facet hypertrophy -prescribed topamax 58m BID -lidocaine patches seem to help -continue Aqua Therapy -Reviewed MRI of lumbar spine with her which does mild facet hypertrophy at multiple levels -Discussed poor response to medial branch block.  -continue hydrocodone  7) Visual deficits:  -encouraged follow-up with optometrist.  -advised that this could contribute to headaches.   8) Impaired mobility and ADLs -placed order  for wheelchair  9) Thoracic spine pain: -discussed that large breasts can contribute -continue Ozempic, discussed that she has lost 8 lbs.

## 2022-07-03 ENCOUNTER — Other Ambulatory Visit: Payer: Self-pay

## 2022-07-04 ENCOUNTER — Other Ambulatory Visit: Payer: Self-pay | Admitting: Physical Medicine and Rehabilitation

## 2022-07-04 ENCOUNTER — Other Ambulatory Visit: Payer: Self-pay

## 2022-07-04 MED ORDER — HYDROCODONE-ACETAMINOPHEN 5-325 MG PO TABS
1.0000 | ORAL_TABLET | Freq: Four times a day (QID) | ORAL | 0 refills | Status: DC | PRN
  Filled 2022-07-04: qty 110, 28d supply, fill #0

## 2022-07-04 MED ORDER — HYDROCODONE-ACETAMINOPHEN 5-325 MG PO TABS: 1.0000 | ORAL_TABLET | Freq: Four times a day (QID) | ORAL | 0 refills | Status: DC | PRN

## 2022-07-17 ENCOUNTER — Encounter: Payer: Medicaid Other | Admitting: Physical Medicine and Rehabilitation

## 2022-07-21 NOTE — Progress Notes (Signed)
Subjective:    Patient ID: Karen Whitehead, female    DOB: 02-02-1966, 57 y.o.   MRN: [id number]  HPI .  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  -she gets lots of headaches -reviewed her hip and knee XRs with her: negative -she would like to get XRs of her spine as well.  -her hands and muscles throb so baldy that she cannot fall asleep at night -she takes one muscle relaxer in the morning -she used to be very active and takes girls trips to the beach.  -in tears today when she heard we had to reschedule her appointment for tomorrow because she was hoping to at least get some relief from the trigger point injections -she can barely get out of bed without assistance due to the severity of pain in the muscles of her back -she does not want to try any more muscle relaxers as these do not help enough -she has never tried tylenol with codeine.  -she does not want to increase the amitriptyline further   2) Insomnia: -she currently takes trazodone and sertraline.  -she has not found relief with the amitriptyline 10mg . She would like to try this again -her pain keeps her awake  3) memory loss -has not worked with SLP before -does not follow with neurology -discussed results of her MRI brain with her- shows degeneration atypical for her age but no acute processes.   4) Rash- has to wear everything shortsleeve  5) Lumbar DDD: -she cannot handle the pain -pain is most severe in right lower  -she finds great relief from the hydrocodone- it helps her  to function better -muscle relaxers don't help -she loves aquatic therapy- the take their time and are trying to build her muscles -she has an EMG/NCS scheduled -she wants a medicine to help her get through the pain today.  -she has tried ibuprofen and was was told not to because of her kidneys -had ESI and could not walk after -still has pain in her lower back where her pain is the worst -pain does not radiate into the legs/ -the trigger point injections helped for 12-13 days -her son heats rags and lays these on her back  6) Fall: -she fell sideways on her elbow when she could not judge the distance appropriately. -she fell on her knee recently.  -she is going to be seeing an orthopedic doctor  7) Vision goes in and out -sometimes she can see the TV and sometimes she can't   8) Thoracic spine pain -she feels large breasts contribute to this  9) Right groin pain and numbness -does not feel hip pain -does not feel mass  Diet: her son usually cooks her meals for her.    Pain Inventory Average Pain 9 Pain Right Now 9 My pain is constant, sharp, stabbing, tingling and aching  In the last 24 hours, has pain interfered with the following? General activity 10 Relation with others 10 Enjoyment of life 10 What TIME of day is your pain  at its worst? morning , daytime, evening and night Sleep (in general) Poor  Pain is worse with: walking, bending, inactivity and standing Pain improves with: medication, injections Relief from Meds: 3  use a walker how many minutes can you walk? 5 ability to climb steps?  yes do you drive?  no  not employed: date last employed . I need assistance with the following:  dressing, bathing, household duties and shopping  bladder control problems weakness numbness tingling trouble walking spasms dizziness confusion depression anxiety suicidal thoughts        Family History  Problem Relation Age of Onset   Stomach cancer Mother     Heart attack Father    Breast cancer Neg Hx    Colon cancer Neg Hx    Colon polyps Neg Hx    Social History   Socioeconomic History   Marital status: Single    Spouse name: Not on file   Number of children: 1   Years of education: Not on file   Highest education level: Some college, no degree  Occupational History   Not on file  Tobacco Use   Smoking status: Every Day    Packs/day: .5    Types: Cigarettes   Smokeless tobacco: Never  Vaping Use   Vaping Use: Never used  Substance and Sexual Activity   Alcohol use: No   Drug use: No   Sexual activity: Not Currently  Other Topics Concern   Not on file  Social History Narrative   Not on file   Social Determinants of Health   Financial Resource Strain: Not on file  Food Insecurity: Food Insecurity Present (03/14/2021)   Hunger Vital Sign    Worried About Running Out of Food in the Last Year: Often true    Ran Out of Food in the Last Year: Sometimes true  Transportation Needs: No Transportation Needs (03/14/2021)   PRAPARE - Hydrologist (Medical): No    Lack of Transportation (Non-Medical): No  Physical Activity: Not on file  Stress: Not on file  Social Connections: Not on file   Past Surgical History:  Procedure Laterality Date   APPENDECTOMY     Past Medical History:  Diagnosis Date   Anxiety    Arthritis    Chronic kidney disease    Depression    Diabetes mellitus without complication ([place])    Granuloma annulare    Hyperlipidemia    There were no vitals taken for this visit.  Opioid Risk Score:   Fall Risk Score:  `1  Depression screen [facility] 2/9     05/08/2022   11:34 AM 05/06/2022    9:39 AM 05/01/2022   11:38 AM 11/26/2021   10:46 AM 10/31/2021   11:19 AM 10/28/2021    2:59 PM 07/25/2021   10:23 AM  Depression screen [facility] 2/9  Decreased Interest 0 0 2 0 3 1 0  Down, Depressed, Hopeless 0 0 3 0 3 1 1   PHQ - 2 Score 0 0 5 0 6 2 1   Altered sleeping   3  3    Tired,  decreased energy   3  3    Change in appetite   3  3    Feeling bad or failure about yourself    3  3    Trouble concentrating   2  3    Moving slowly or fidgety/restless   1  1    Suicidal thoughts   1  2    PHQ-9 Score   21  24      Review of Systems  Constitutional:  Positive for appetite change.  Respiratory:  Positive for cough and shortness of breath.   Gastrointestinal:  Positive for constipation.  Musculoskeletal:  Positive for back pain, gait problem and neck pain.       Left hip and knee pain Pain in back of legs  Neurological:  Positive for dizziness, weakness and numbness.       Tongling  Psychiatric/Behavioral:  Positive for confusion, dysphoric mood and suicidal ideas. The patient is nervous/anxious.   All other systems reviewed and are negative.      Objective:   Physical Exam Gen: no distress, normal appearing HEENT: oral mucosa pink and moist, NCAT Cardio: Reg rate Chest: normal effort, normal rate of breathing Abd: soft, non-distended Ext: no edema Psych: pleasant, normal affect Skin: intact Neuro: Alert and oriented x3, exquisitely tender to pain in lumbar spine    Assessment & Plan:  1)Chronic pain syndrome and fatigue secondary to myalgias and arthralgias following Dengue fever -Discussed current symptoms of pain and history of pain.  -Discussed benefits of exercise in reducing pain. -discussed that XRs of hip and knees are normal -ordered XRs of spine as well. -refilled hydrocodone, can consider increasing frequency to 5 tabs per day when she is due for next refill.  -discussed tolerance and addiction, she does experience tolerance  -continue water aerobics -asked staff to schedule appointment for her tomorrow at 8:20am for trigger point injections -discussed stronger muscle relaxer such as tizanidine but she does not want to try this -discussed increasing amitriptyline dose further but she does not want to try this -discussed coming into clinic  for urine sample/signing pain contract in order to received controlled medications.  -cannot increase meloxicam further given risk of GI bleeding.  -cannot use cymbalta due to risk of bleed.  -discussed risks and of tylenol and NSAIDs.  -prescribed lidocaine patches.  -discussed spinal cord stimulator -Discussed Qutenza as an option for neuropathic pain control. Discussed that this is a capsaicin patch, stronger than capsaicin cream. Discussed that it is currently approved for diabetic peripheral neuropathy and post-herpetic neuralgia, but that it has also shown benefit in treating other forms of neuropathy. Provided patient with link to site to learn more about the patch: CinemaBon[employer].fr. Discussed that the patch would be placed in office and benefits usually last 3 months. Discussed that unintended exposure to capsaicin can cause severe irritation of eyes, mucous membranes, respiratory tract, and skin, but that Qutenza is a local treatment and does not have the systemic side effects of other nerve medications. Discussed that there may be pain, itching, erythema, and decreased sensory function associated with the application of Qutenza. Side effects usually subside within 1 week. A cold pack of analgesic medications can help with these side effects. Blood pressure can also be increased due to pain associated with administration of the patch.  -tylenol does not help -referred to [facility] to discuss spinal cord stimulator -Provided with a pain relief journal and discussed that it contains foods and lifestyle tips to naturally help to improve pain. Discussed that these lifestyle strategies are also very good for health unlike some medications which can have negative side effects. Discussed that the act of keeping a journal can be therapeutic and helpful to realize patterns what helps to trigger and alleviate pain.   -lidocaine patches do help -increase gabapentin to 300mg   TID -Discussed  current symptoms of pain and history of pain.  -Discussed benefits of exercise in reducing pain. -Discussed following foods that may reduce pain: 1) Ginger (especially studied for arthritis)- reduce leukotriene production to decrease inflammation 2) Blueberries- high in phytonutrients that decrease inflammation 3) Salmon- marine omega-3s reduce joint swelling and pain 4) Pumpkin seeds- reduce inflammation 5) dark chocolate- reduces inflammation 6) turmeric- reduces inflammation 7) tart cherries - reduce pain and stiffness 8) extra virgin olive oil - its compound olecanthal helps to block prostaglandins  9) chili peppers- can be eaten or applied topically via capsaicin 10) mint- helpful for headache, muscle aches, joint pain, and itching 11) garlic- reduces inflammation  Link to further information on diet for chronic pain: http://www.randall.com/   2) Insomnia:  -start amitriptyline 10mg  HS -seroquel produced hallucinations.  Insomnia: -Try to go outside near sunrise -Get exercise during the day.  -Turn off all devices an hour before bedtime.  -Teas that can benefit: chamomile, valerian root, Brahmi (Bacopa) -Can consider over the counter melatonin, magnesium, and/or L-theanine. Melatonin is an anti-oxidant with multiple health benefits. Magnesium is involved in greater than 300 enzymatic reactions in the body and most of [place] are deficient as our soil is often depleted. There are 7 different types of magnesium- Bioptemizer's is a supplement with all 7 types, and each has unique benefits. Magnesium can also help with constipation and anxiety.  -Pistachios naturally increase the production of melatonin -Cozy Earth bamboo bed sheets are free from toxic chemicals.  -Tart cherry juice or a tart cherry supplement can improve sleep and soreness post-workout    3) Left sided weakness/memory loss -MRI brain  ordered to assess for possible contributory neurological process: reviewed results with her: shows degeneration atypical for her age without acute process -referred to neurology -restart 1TB coconut oil daily, discussed may feel nausea at first, goal to increase to 4 TB over time as tolerated  4) Anxiety: discussed that her clonopin has to be filled by her psychiatry or internal medicine.   5) Myofascial pain: trigger point injections performed today -continue muscle relaxers.  Trigger Point Injection  Indication: Lumbar myofascial pain not relieved by medication management and other conservative care.  Informed consent was obtained after describing risk and benefits of the procedure with the patient, this includes bleeding, bruising, infection and medication side effects.  The patient wishes to proceed and has given written consent.  The patient was placed in a seated position.  The area of pain was marked and prepped with Betadine.  It was entered with a 25-gauge 1/2 inch needle and a total of 5 mL of 1% lidocaine and normal saline was injected into a total of 4 trigger points, after negative draw back for blood.  The patient tolerated the procedure well.  Post procedure instructions were given.   6) Lumbar disc degeneration with facet hypertrophy -prescribed topamax 25mg  BID -lidocaine patches seem to help -continue Aqua Therapy -Reviewed MRI of lumbar spine with her which does mild facet hypertrophy at multiple levels -Discussed poor response to medial branch block.  -continue hydrocodone  7) Visual deficits:  -encouraged follow-up with optometrist.  -advised that this could contribute to headaches.   8) Impaired mobility and ADLs -placed order for wheelchair  9) Thoracic spine pain: -discussed that large breasts can contribute -continue Ozempic, discussed that she has lost 8 lbs.   10) Right sided groin discomfort: -pelvic XR ordered

## 2022-07-24 ENCOUNTER — Encounter: Payer: Self-pay | Admitting: Physical Medicine and Rehabilitation

## 2022-07-24 ENCOUNTER — Other Ambulatory Visit: Payer: Self-pay

## 2022-07-24 ENCOUNTER — Encounter
Payer: Medicaid Other | Attending: Physical Medicine and Rehabilitation | Admitting: Physical Medicine and Rehabilitation

## 2022-07-24 VITALS — BP 131/83 | HR 76 | Temp 98.8°F | Ht 64.0 in | Wt 220.0 lb

## 2022-07-24 DIAGNOSIS — R1031 Right lower quadrant pain: Secondary | ICD-10-CM | POA: Diagnosis not present

## 2022-07-24 DIAGNOSIS — G894 Chronic pain syndrome: Secondary | ICD-10-CM | POA: Insufficient documentation

## 2022-07-24 DIAGNOSIS — M791 Myalgia, unspecified site: Secondary | ICD-10-CM | POA: Diagnosis not present

## 2022-07-24 DIAGNOSIS — G4701 Insomnia due to medical condition: Secondary | ICD-10-CM | POA: Insufficient documentation

## 2022-07-24 MED ORDER — AMITRIPTYLINE HCL 10 MG PO TABS
10.0000 mg | ORAL_TABLET | Freq: Every day | ORAL | 1 refills | Status: DC
  Filled 2022-07-24: qty 30, 30d supply, fill #0
  Filled 2022-08-22: qty 30, 30d supply, fill #1
  Filled 2022-08-22: qty 30, 30d supply, fill #0

## 2022-07-24 MED ORDER — HYDROCODONE-ACETAMINOPHEN 5-325 MG PO TABS
1.0000 | ORAL_TABLET | Freq: Four times a day (QID) | ORAL | 0 refills | Status: DC | PRN
  Filled 2022-07-24 – 2022-07-29 (×2): qty 120, 30d supply, fill #0

## 2022-07-24 MED ORDER — LIDOCAINE HCL 1 % IJ SOLN
5.0000 mL | Freq: Once | INTRAMUSCULAR | Status: AC
  Administered 2022-07-24: 5 mL via INTRADERMAL

## 2022-07-24 NOTE — Patient Instructions (Signed)
Insomnia: ?-Try to go outside near sunrise ?-Get exercise during the day.  ?-Turn off all devices an hour before bedtime.  ?-Teas that can benefit: chamomile, valerian root, Brahmi (Bacopa) ?-Can consider over the counter melatonin, magnesium, and/or L-theanine. Melatonin is an anti-oxidant with multiple health benefits. Magnesium is involved in greater than 300 enzymatic reactions in the body and most of us are deficient as our soil is often depleted. There are 7 different types of magnesium- Bioptemizer's is a supplement with all 7 types, and each has unique benefits. Magnesium can also help with constipation and anxiety.  ?-Pistachios naturally increase the production of melatonin ?-Cozy Earth bamboo bed sheets are free from toxic chemicals.  ?-Tart cherry juice or a tart cherry supplement can improve sleep and soreness post-workout ?  ? ? ?

## 2022-07-25 ENCOUNTER — Other Ambulatory Visit: Payer: Self-pay

## 2022-07-28 ENCOUNTER — Other Ambulatory Visit: Payer: Self-pay

## 2022-07-29 ENCOUNTER — Other Ambulatory Visit: Payer: Self-pay

## 2022-07-31 ENCOUNTER — Ambulatory Visit: Payer: Medicaid Other

## 2022-08-04 ENCOUNTER — Telehealth: Payer: Self-pay | Admitting: Registered Nurse

## 2022-08-04 ENCOUNTER — Encounter: Payer: Self-pay | Admitting: Physical Medicine and Rehabilitation

## 2022-08-04 ENCOUNTER — Other Ambulatory Visit: Payer: Self-pay

## 2022-08-04 MED ORDER — HYDROCODONE-ACETAMINOPHEN 5-325 MG PO TABS: 1.0000 | ORAL_TABLET | Freq: Four times a day (QID) | ORAL | 0 refills | Status: DC | PRN

## 2022-08-04 NOTE — Telephone Encounter (Signed)
PMP was Reviewed.  UDS was Reviewed.  Dr Ranell Patrick note was reviewed.  Placed a call to Ms. Webb, no answer and voice mail not set up.  Sent a My- Chart message. Hydrocodone e-scribed today.

## 2022-08-22 ENCOUNTER — Other Ambulatory Visit: Payer: Self-pay

## 2022-08-22 ENCOUNTER — Other Ambulatory Visit (HOSPITAL_COMMUNITY): Payer: Self-pay

## 2022-09-04 ENCOUNTER — Encounter: Payer: Self-pay | Attending: Physical Medicine and Rehabilitation | Admitting: Physical Medicine and Rehabilitation

## 2022-09-04 ENCOUNTER — Encounter: Payer: Self-pay | Admitting: Physical Medicine and Rehabilitation

## 2022-09-04 VITALS — BP 129/86 | HR 74 | Temp 98.3°F | Ht 64.0 in | Wt 217.0 lb

## 2022-09-04 DIAGNOSIS — G4709 Other insomnia: Secondary | ICD-10-CM

## 2022-09-04 DIAGNOSIS — F411 Generalized anxiety disorder: Secondary | ICD-10-CM

## 2022-09-04 DIAGNOSIS — M791 Myalgia, unspecified site: Secondary | ICD-10-CM

## 2022-09-04 MED ORDER — LIDOCAINE HCL 1 % IJ SOLN: 5.0000 mL | Freq: Once | INTRAMUSCULAR | Status: AC

## 2022-09-04 MED ORDER — HYDROCODONE-ACETAMINOPHEN 5-325 MG PO TABS: 1.0000 | ORAL_TABLET | Freq: Four times a day (QID) | ORAL | 0 refills | Status: DC | PRN

## 2022-09-04 NOTE — Patient Instructions (Signed)
Insomnia: -Try to go outside near sunrise -Get exercise during the day.  -Turn off all devices an hour before bedtime.  -Teas that can benefit: chamomile, valerian root, Brahmi (Bacopa) -Can consider over the counter melatonin, magnesium, and/or L-theanine. Melatonin is an anti-oxidant with multiple health benefits. Magnesium is involved in greater than 300 enzymatic reactions in the body and most of [place] are deficient as our soil is often depleted. There are 7 different types of magnesium- Bioptemizer's is a supplement with all 7 types, and each has unique benefits. Magnesium can also help with constipation and anxiety.  -Pistachios naturally increase the production of melatonin -Cozy Earth bamboo bed sheets are free from toxic chemicals.  -Tart cherry juice or a tart cherry supplement can improve sleep and soreness post-workout    Anxiety: -Discussed exercise and meditation as tools to decrease anxiety. -Recommended Down Dog Yoga app -Discussed spending time outdoors. -Discussed positive re-framing of anxiety.  -Discussed the following foods that have been show to reduce anxiety: 1) [place] nuts, mushrooms, soy beans due to their high selenium content. Upper limit of toxicity of selenium is 469mcg/day so no more than 3-4 [place] nuts per day.  2) Fatty fish such as salmon, mackerel, sardines, trout, and herring- high in omega-3 fatty acids 3) Eggs- increases serotonin and dopamine 4) Pumpkin seeds- high in omega-3 fatty acids 5) dark chocolate- high in flavanols that increase blood flow to brain 6) turmeric- take with black pepper to increase absorption 7) chamomile tea- antioxidant and anti-inflammatory properties 8) yogurt without sugar- supports gut-brain axis 9) green tea- contains L- theanine 10) blueberries- high in vitamin C and antioxidants 11) [place]- high in tryptophan which gets converted to serotonin 12) bell peppers- rich in vitamin C and antioxidants 13) citrus fruits- rich  in vitamin C and antioxidants 14) almonds- high in vitamin E and healthy fats 15) chia seeds- high in omega-3 fatty acids -Made goal to ____

## 2022-09-04 NOTE — Progress Notes (Signed)
Subjective:    Patient ID: Karen Whitehead, female    DOB: [birth year], 57 y.o.   MRN: [id number]  HPI .  Karen Whitehead is a 57 year old woman who presents for f/u of myalgias post-dengue fever, and chronic lumbar and thoracic pain, insomnia, and anxiety.  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  -she gets lots of headaches -reviewed her hip and knee XRs with her: negative -she would like to get XRs of her spine as well.  -her hands and muscles throb so baldy that she cannot fall asleep at night -she takes one muscle relaxer in the morning -she used to be very active and takes girls trips to the beach.  -in tears today when she heard we had to reschedule her appointment for tomorrow because she was hoping to at least get some relief from the trigger point injections -she can barely get out of bed without assistance due to the severity of pain in the muscles of her back -she does not want to try any more muscle relaxers as these do not help enough -she has never tried tylenol with codeine.  -she does not want to increase the amitriptyline further   2) Insomnia: -she currently takes trazodone and sertraline.  -she has not found relief with the amitriptyline 10mg . She would like to try this again -her pain keeps her awake -she found amitriptyline helpful  3) memory loss -has not worked with SLP before -does not follow with neurology -discussed results of her MRI brain with her- shows degeneration atypical for her age but no acute processes.   4) Rash- has to wear everything shortsleeve  5) Lumbar DDD: -she cannot handle the pain -pain is most severe in right lower   -she finds great relief from the hydrocodone- it helps her to function better -muscle relaxers don't help -she loves aquatic therapy- the take their time and are trying to build her muscles -she has an EMG/NCS scheduled -she wants a medicine to help her get through the pain today.  -she has tried ibuprofen and was was told not to because of her kidneys -had ESI and could not walk after -still has pain in her lower back where her pain is the worst -pain does not radiate into the legs/ -the trigger point injections helped for 12-13 days -her son heats rags and lays these on her back  6) Fall: -she fell sideways on her elbow when she could not judge the distance appropriately. -she fell on her knee recently.  -she is going to be seeing an orthopedic doctor  7) Vision goes in and out -sometimes she can see the TV and sometimes she can't   8) Thoracic spine pain -she feels large breasts contribute to this  9) Right groin pain and numbness -does not feel hip pain -does not feel mass  10) Anxiety -she found amitriptyline helpful for her anxiety as well  Diet: her son usually cooks her meals for her.    Pain Inventory Average Pain 9 Pain Right Now 9 My pain is constant, sharp, stabbing, tingling and aching  In the last 24 hours, has pain interfered with the  following? General activity 10 Relation with others 10 Enjoyment of life 10 What TIME of day is your pain at its worst? morning , daytime, evening and night Sleep (in general) Poor  Pain is worse with: walking, bending, inactivity and standing Pain improves with: medication, injections Relief from Meds: 3  use a walker how many minutes can you walk? 5 ability to climb steps?  yes do you drive?  no  not employed: date last employed . I need assistance with the following:  dressing, bathing, household duties and shopping  bladder control problems weakness numbness tingling trouble  walking spasms dizziness confusion depression anxiety suicidal thoughts        Family History  Problem Relation Age of Onset   Stomach cancer Mother    Heart attack Father    Breast cancer Neg Hx    Colon cancer Neg Hx    Colon polyps Neg Hx    Social History   Socioeconomic History   Marital status: Single    Spouse name: Not on file   Number of children: 1   Years of education: Not on file   Highest education level: Some college, no degree  Occupational History   Not on file  Tobacco Use   Smoking status: Every Day    Packs/day: .5    Types: Cigarettes   Smokeless tobacco: Never  Vaping Use   Vaping Use: Never used  Substance and Sexual Activity   Alcohol use: No   Drug use: No   Sexual activity: Not Currently  Other Topics Concern   Not on file  Social History Narrative   Not on file   Social Determinants of Health   Financial Resource Strain: Not on file  Food Insecurity: Food Insecurity Present (03/14/2021)   Hunger Vital Sign    Worried About Running Out of Food in the Last Year: Often true    Ran Out of Food in the Last Year: Sometimes true  Transportation Needs: No Transportation Needs (03/14/2021)   PRAPARE - Administrator, Civil Service (Medical): No    Lack of Transportation (Non-Medical): No  Physical Activity: Not on file  Stress: Not on file  Social Connections: Not on file   Past Surgical History:  Procedure Laterality Date   APPENDECTOMY     Past Medical History:  Diagnosis Date   Anxiety    Arthritis    Chronic kidney disease    Depression    Diabetes mellitus without complication (HCC)    Granuloma annulare    Hyperlipidemia    BP 129/86   Pulse 74   Temp 98.3 F (36.8 C)   Ht 5\' 4"  (1.626 m)   Wt 217 lb (98.4 kg)   SpO2 95%   BMI 37.25 kg/m   Opioid Risk Score:   Fall Risk Score:  `1  Depression screen [facility] 2/9     09/04/2022   10:26 AM 07/24/2022   10:07 AM 05/08/2022   11:34 AM 05/06/2022    9:39  AM 05/01/2022   11:38 AM 11/26/2021   10:46 AM 10/31/2021   11:19 AM  Depression screen PHQ 2/9  Decreased Interest 1 1 0 0 2 0 3  Down, Depressed, Hopeless 1 1 0 0 3 0 3  PHQ - 2 Score 2 2 0 0 5 0 6  Altered sleeping     3  3  Tired, decreased energy     3  3  Change in appetite  3  3  Feeling bad or failure about yourself      3  3  Trouble concentrating     2  3  Moving slowly or fidgety/restless     1  1  Suicidal thoughts     1  2  PHQ-9 Score     21  24    Review of Systems  Constitutional:  Positive for appetite change.  Respiratory:  Positive for cough and shortness of breath.   Gastrointestinal:  Positive for constipation.  Musculoskeletal:  Positive for back pain, gait problem and neck pain.       Left hip and knee pain Pain in back of legs  Neurological:  Positive for dizziness, weakness and numbness.       Tongling  Psychiatric/Behavioral:  Positive for confusion, dysphoric mood and suicidal ideas. The patient is nervous/anxious.   All other systems reviewed and are negative.      Objective:   Physical Exam Gen: no distress, normal appearing HEENT: oral mucosa pink and moist, NCAT Cardio: Reg rate Chest: normal effort, normal rate of breathing Abd: soft, non-distended Ext: no edema Psych: pleasant, normal affect Skin: intact Neuro: Alert and oriented x3, exquisitely tender to pain in lumbar spine    Assessment & Plan:  1)Chronic pain syndrome and fatigue secondary to myalgias and arthralgias following Dengue fever -Discussed current symptoms of pain and history of pain.  -Discussed benefits of exercise in reducing pain. -discussed that XRs of hip and knees are normal -ordered XRs of spine as well. -refilled hydrocodone, can consider increasing frequency to 5 tabs per day when she is due for next refill.  -discussed tolerance and addiction, she does experience tolerance  -continue water aerobics -asked staff to schedule appointment for her tomorrow at  8:20am for trigger point injections -discussed stronger muscle relaxer such as tizanidine but she does not want to try this -discussed increasing amitriptyline dose further but she does not want to try this -discussed coming into clinic for urine sample/signing pain contract in order to received controlled medications.  -cannot increase meloxicam further given risk of GI bleeding.  -cannot use cymbalta due to risk of bleed.  -discussed risks and of tylenol and NSAIDs.  -prescribed lidocaine patches.  -discussed spinal cord stimulator -Discussed Qutenza as an option for neuropathic pain control. Discussed that this is a capsaicin patch, stronger than capsaicin cream. Discussed that it is currently approved for diabetic peripheral neuropathy and post-herpetic neuralgia, but that it has also shown benefit in treating other forms of neuropathy. Provided patient with link to site to learn more about the patch: https://www.clark.biz/. Discussed that the patch would be placed in office and benefits usually last 3 months. Discussed that unintended exposure to capsaicin can cause severe irritation of eyes, mucous membranes, respiratory tract, and skin, but that Qutenza is a local treatment and does not have the systemic side effects of other nerve medications. Discussed that there may be pain, itching, erythema, and decreased sensory function associated with the application of Qutenza. Side effects usually subside within 1 week. A cold pack of analgesic medications can help with these side effects. Blood pressure can also be increased due to pain associated with administration of the patch.  -tylenol does not help -referred to [facility] to discuss spinal cord stimulator -Provided with a pain relief journal and discussed that it contains foods and lifestyle tips to naturally help to improve pain. Discussed that these lifestyle strategies are also very good  for health unlike some medications which  can have negative side effects. Discussed that the act of keeping a journal can be therapeutic and helpful to realize patterns what helps to trigger and alleviate pain.   -lidocaine patches do help -increase gabapentin to 300mg  TID -Discussed current symptoms of pain and history of pain.  -Discussed benefits of exercise in reducing pain. -Discussed following foods that may reduce pain: 1) Ginger (especially studied for arthritis)- reduce leukotriene production to decrease inflammation 2) Blueberries- high in phytonutrients that decrease inflammation 3) Salmon- marine omega-3s reduce joint swelling and pain 4) Pumpkin seeds- reduce inflammation 5) dark chocolate- reduces inflammation 6) turmeric- reduces inflammation 7) tart cherries - reduce pain and stiffness 8) extra virgin olive oil - its compound olecanthal helps to block prostaglandins  9) chili peppers- can be eaten or applied topically via capsaicin 10) mint- helpful for headache, muscle aches, joint pain, and itching 11) garlic- reduces inflammation  Link to further information on diet for chronic pain: http://www.bray.com/   2) Insomnia:  -continue  amitriptyline 10mg  HS -seroquel produced hallucinations.  -Try to go outside near sunrise -Get exercise during the day.  -Turn off all devices an hour before bedtime.  -Teas that can benefit: chamomile, valerian root, Brahmi (Bacopa) -Can consider over the counter melatonin, magnesium, and/or L-theanine. Melatonin is an anti-oxidant with multiple health benefits. Magnesium is involved in greater than 300 enzymatic reactions in the body and most of [place] are deficient as our soil is often depleted. There are 7 different types of magnesium- Bioptemizer's is a supplement with all 7 types, and each has unique benefits. Magnesium can also help with constipation and anxiety.  -Pistachios naturally increase the production of  melatonin -Cozy Earth bamboo bed sheets are free from toxic chemicals.  -Tart cherry juice or a tart cherry supplement can improve sleep and soreness post-workout      3) Left sided weakness/memory loss -MRI brain ordered to assess for possible contributory neurological process: reviewed results with her: shows degeneration atypical for her age without acute process -referred to neurology -restart 1TB coconut oil daily, discussed may feel nausea at first, goal to increase to 4 TB over time as tolerated  .   5) Myofascial pain: trigger point injections performed today -continue muscle relaxers.  Trigger Point Injection  Indication: Lumbar myofascial pain not relieved by medication management and other conservative care.  Informed consent was obtained after describing risk and benefits of the procedure with the patient, this includes bleeding, bruising, infection and medication side effects.  The patient wishes to proceed and has given written consent.  The patient was placed in a seated position.  The area of pain was marked and prepped with Betadine.  It was entered with a 25-gauge 1/2 inch needle and a total of 5 mL of 1% lidocaine and normal saline was injected into a total of 4 trigger points, after negative draw back for blood.  The patient tolerated the procedure well.  Post procedure instructions were given.    6) Lumbar disc degeneration with facet hypertrophy -prescribed topamax 25mg  BID -lidocaine patches seem to help -continue Aqua Therapy -Reviewed MRI of lumbar spine with her which does mild facet hypertrophy at multiple levels -Discussed poor response to medial branch block.  -continue hydrocodone  7) Visual deficits:  -encouraged follow-up with optometrist.  -advised that this could contribute to headaches.   8) Impaired mobility and ADLs -placed order for wheelchair  9) Thoracic spine pain: -discussed that large breasts can  contribute -continue Ozempic, discussed  that she has lost 8 lbs.   10) Right sided groin discomfort: -pelvic XR ordered  11) Anxiety: -Discussed exercise and meditation as tools to decrease anxiety. -Recommended Down Dog Yoga app -Discussed spending time outdoors. -Discussed positive re-framing of anxiety.  -Discussed the following foods that have been show to reduce anxiety: 1) [place] nuts, mushrooms, soy beans due to their high selenium content. Upper limit of toxicity of selenium is 479mcg/day so no more than 3-4 [place] nuts per day.  2) Fatty fish such as salmon, mackerel, sardines, trout, and herring- high in omega-3 fatty acids 3) Eggs- increases serotonin and dopamine 4) Pumpkin seeds- high in omega-3 fatty acids 5) dark chocolate- high in flavanols that increase blood flow to brain 6) turmeric- take with black pepper to increase absorption 7) chamomile tea- antioxidant and anti-inflammatory properties 8) yogurt without sugar- supports gut-brain axis 9) green tea- contains L- theanine 10) blueberries- high in vitamin C and antioxidants 11) [place]- high in tryptophan which gets converted to serotonin 12) bell peppers- rich in vitamin C and antioxidants 13) citrus fruits- rich in vitamin C and antioxidants 14) almonds- high in vitamin E and healthy fats 15) chia seeds- high in omega-3 fatty acids

## 2022-09-23 IMAGING — CR DG KNEE 1-2V*R*
2 series · 2 of 2 positions shown · non-contrast
Comparison: None.

CLINICAL DATA: Knee injury

EXAM:
RIGHT KNEE - 1-2 VIEW

[knee ap]
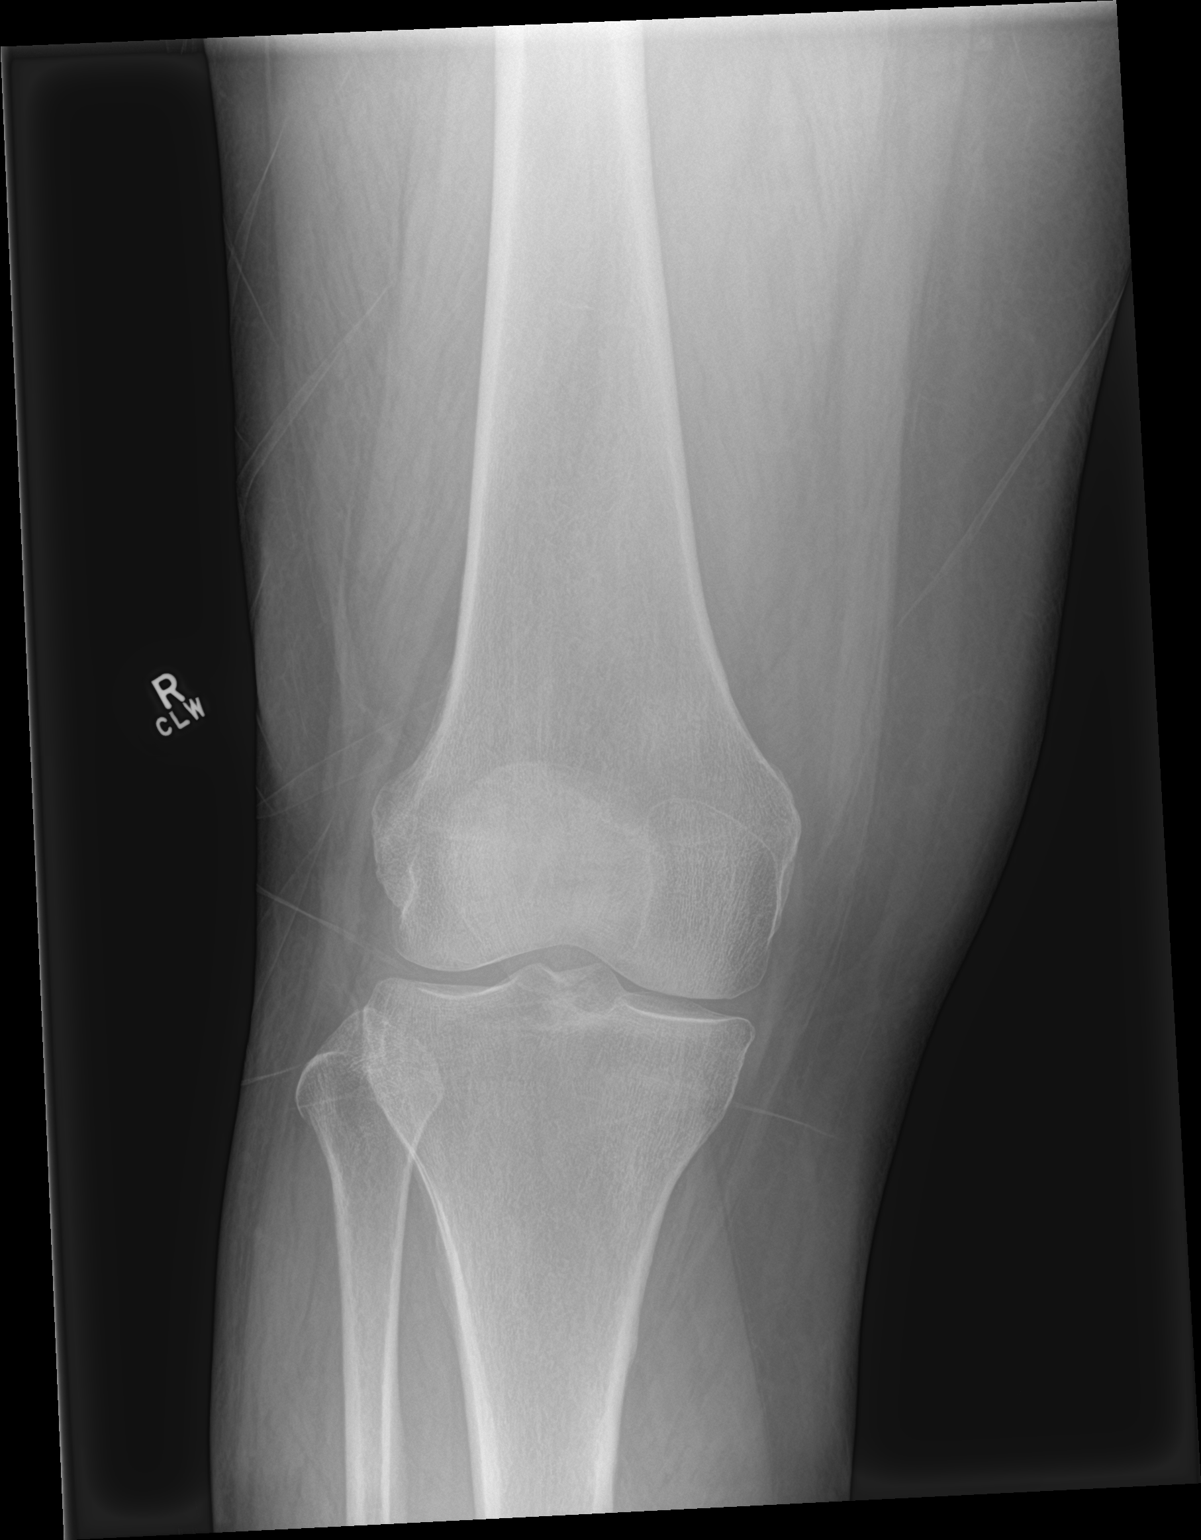

[knee lat]
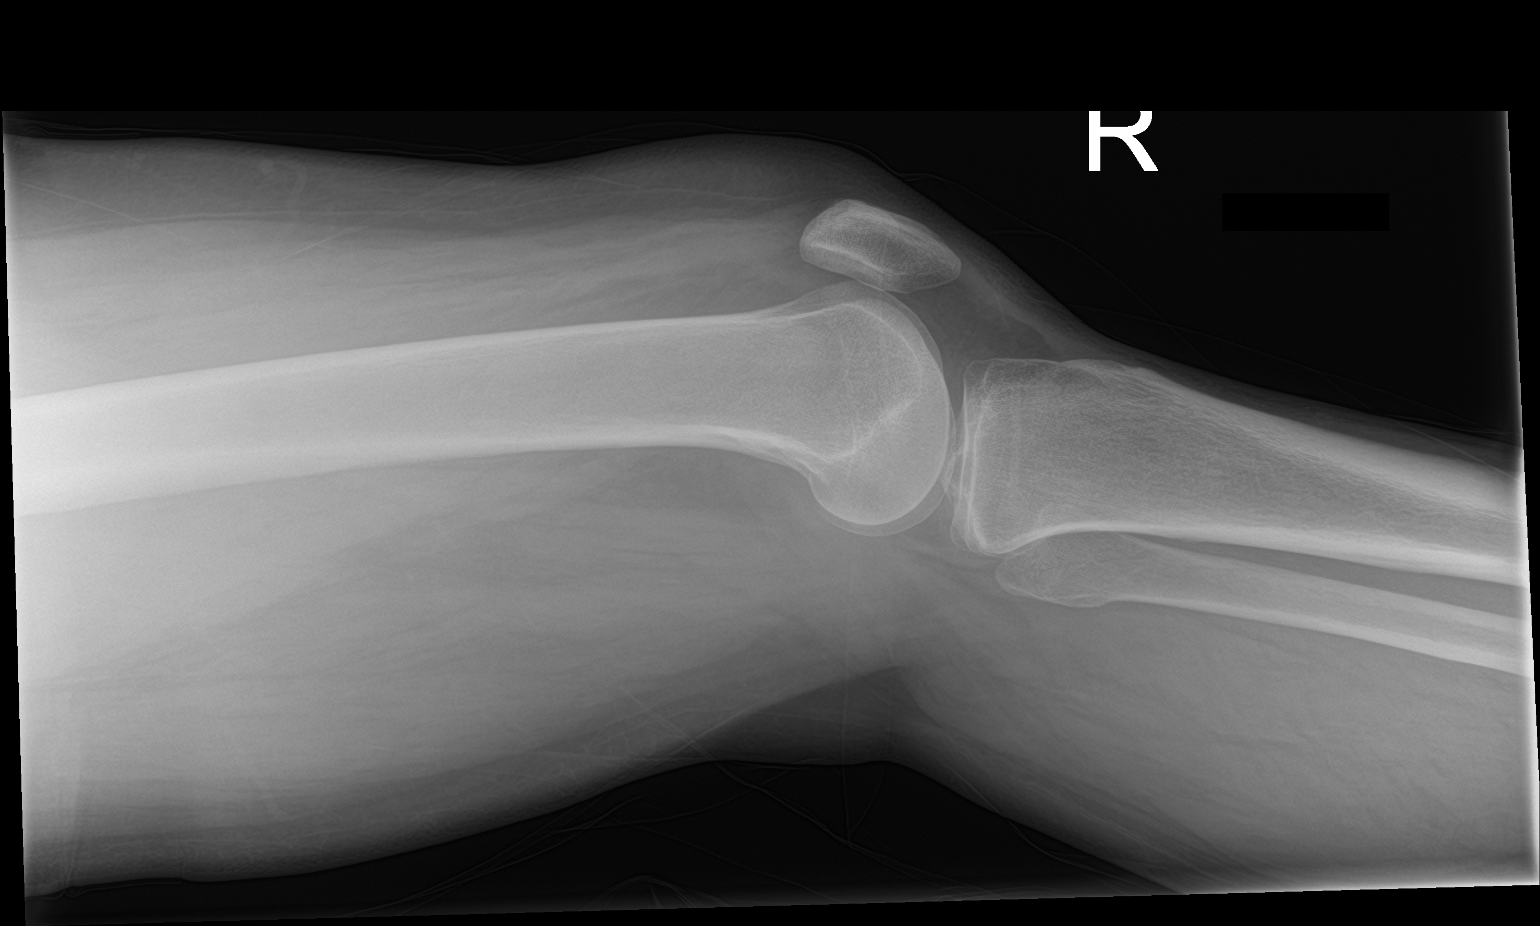

[2 of 2 positions shown; findings below may reference images not displayed]

FINDINGS: No evidence of fracture, dislocation, or joint effusion. No evidence
of arthropathy or other focal bone abnormality. Soft tissues are
unremarkable.
IMPRESSION: Negative.

## 2022-10-06 ENCOUNTER — Other Ambulatory Visit (HOSPITAL_COMMUNITY): Payer: Self-pay

## 2022-10-06 ENCOUNTER — Other Ambulatory Visit (HOSPITAL_COMMUNITY): Payer: Self-pay | Admitting: Psychiatry

## 2022-10-06 ENCOUNTER — Other Ambulatory Visit: Payer: Self-pay | Admitting: Physical Medicine and Rehabilitation

## 2022-10-06 ENCOUNTER — Encounter: Payer: Self-pay | Admitting: Physical Medicine and Rehabilitation

## 2022-10-06 DIAGNOSIS — F3181 Bipolar II disorder: Secondary | ICD-10-CM

## 2022-10-06 DIAGNOSIS — F411 Generalized anxiety disorder: Secondary | ICD-10-CM

## 2022-10-06 MED ORDER — AMITRIPTYLINE HCL 10 MG PO TABS
10.0000 mg | ORAL_TABLET | Freq: Every day | ORAL | 1 refills | Status: AC
  Filled 2022-10-06 – 2022-10-07 (×2): qty 30, 30d supply, fill #0
  Filled 2022-11-18: qty 30, 30d supply, fill #1

## 2022-10-07 ENCOUNTER — Other Ambulatory Visit (HOSPITAL_COMMUNITY): Payer: Self-pay

## 2022-10-07 ENCOUNTER — Other Ambulatory Visit: Payer: Self-pay

## 2022-10-07 ENCOUNTER — Encounter (HOSPITAL_COMMUNITY): Payer: Self-pay

## 2022-10-07 ENCOUNTER — Other Ambulatory Visit: Payer: Self-pay | Admitting: Physical Medicine and Rehabilitation

## 2022-10-07 MED ORDER — HYDROCODONE-ACETAMINOPHEN 5-325 MG PO TABS
1.0000 | ORAL_TABLET | Freq: Four times a day (QID) | ORAL | 0 refills | Status: DC | PRN
  Filled 2022-10-07 (×2): qty 120, 30d supply, fill #0

## 2022-10-13 ENCOUNTER — Other Ambulatory Visit (HOSPITAL_COMMUNITY): Payer: Self-pay

## 2022-10-14 ENCOUNTER — Encounter: Payer: No Typology Code available for payment source | Admitting: Physical Medicine and Rehabilitation

## 2022-11-12 ENCOUNTER — Encounter: Payer: Self-pay | Admitting: Physical Medicine and Rehabilitation

## 2022-11-18 ENCOUNTER — Other Ambulatory Visit: Payer: Self-pay | Admitting: Family Medicine

## 2022-11-18 ENCOUNTER — Other Ambulatory Visit: Payer: Self-pay

## 2022-11-18 ENCOUNTER — Other Ambulatory Visit (HOSPITAL_COMMUNITY): Payer: Self-pay | Admitting: Psychiatry

## 2022-11-18 DIAGNOSIS — F3181 Bipolar II disorder: Secondary | ICD-10-CM

## 2022-11-18 DIAGNOSIS — E1165 Type 2 diabetes mellitus with hyperglycemia: Secondary | ICD-10-CM

## 2022-11-18 DIAGNOSIS — F411 Generalized anxiety disorder: Secondary | ICD-10-CM

## 2022-11-18 MED ORDER — OZEMPIC (0.25 OR 0.5 MG/DOSE) 2 MG/3ML ~~LOC~~ SOPN
0.5000 mg | PEN_INJECTOR | SUBCUTANEOUS | 0 refills | Status: DC
Start: 2022-11-18 — End: 2022-11-26
  Filled 2022-11-18: qty 3, 28d supply, fill #0

## 2022-11-19 ENCOUNTER — Other Ambulatory Visit: Payer: Self-pay

## 2022-11-19 ENCOUNTER — Other Ambulatory Visit (HOSPITAL_COMMUNITY): Payer: Self-pay

## 2022-11-24 ENCOUNTER — Encounter
Payer: No Typology Code available for payment source | Attending: Physical Medicine and Rehabilitation | Admitting: Registered Nurse

## 2022-11-24 ENCOUNTER — Encounter: Payer: Self-pay | Admitting: Registered Nurse

## 2022-11-24 VITALS — BP 118/86 | HR 81 | Ht 64.0 in | Wt 209.0 lb

## 2022-11-24 DIAGNOSIS — M47817 Spondylosis without myelopathy or radiculopathy, lumbosacral region: Secondary | ICD-10-CM

## 2022-11-24 DIAGNOSIS — Z79899 Other long term (current) drug therapy: Secondary | ICD-10-CM

## 2022-11-24 DIAGNOSIS — Z5181 Encounter for therapeutic drug level monitoring: Secondary | ICD-10-CM | POA: Diagnosis not present

## 2022-11-24 DIAGNOSIS — G8929 Other chronic pain: Secondary | ICD-10-CM | POA: Diagnosis present

## 2022-11-24 DIAGNOSIS — M546 Pain in thoracic spine: Secondary | ICD-10-CM

## 2022-11-24 DIAGNOSIS — M7918 Myalgia, other site: Secondary | ICD-10-CM

## 2022-11-24 DIAGNOSIS — G894 Chronic pain syndrome: Secondary | ICD-10-CM | POA: Diagnosis not present

## 2022-11-24 MED ORDER — HYDROCODONE-ACETAMINOPHEN 5-325 MG PO TABS: 1.0000 | ORAL_TABLET | Freq: Four times a day (QID) | ORAL | 0 refills | Status: DC | PRN

## 2022-11-24 NOTE — Progress Notes (Unsigned)
Subjective:    Patient ID: Karen Whitehead, female    DOB: 1966-03-07, 57 y.o.   MRN: [id number]  HPI: [name] is a 57 y.o. female who returns for follow up appointment for chronic pain and medication refill. states *** pain is located in  ***. rates pain ***. current exercise regime is walking and performing stretching exercises.  [name] Morphine equivalent is *** MME.        Pain Inventory Average Pain 9 Pain Right Now 9 My pain is constant, burning, dull, stabbing, tingling, and aching  In the last 24 hours, has pain interfered with the following? General activity 8 Relation with others 7 Enjoyment of life 10 What TIME of day is your pain at its worst? morning , daytime, evening, and night Sleep (in general) Poor  Pain is worse with: walking, bending, sitting, and standing Pain improves with: heat/ice, medication, and TENS Relief from Meds: 5  Family History  Problem Relation Age of Onset   Stomach cancer Mother    Heart attack Father    Breast cancer Neg Hx    Colon cancer Neg Hx    Colon polyps Neg Hx    Social History   Socioeconomic History   Marital status: Single    Spouse name: Not on file   Number of children: 1   Years of education: Not on file   Highest education level: Some college, no degree  Occupational History   Not on file  Tobacco Use   Smoking status: Every Day    Current packs/day: 0.50    Types: Cigarettes   Smokeless tobacco: Never  Vaping Use   Vaping status: Never Used  Substance and Sexual Activity   Alcohol use: No   Drug use: No   Sexual activity: Not Currently  Other Topics Concern   Not on file  Social History Narrative   Not on file   Social Determinants of Health   Financial Resource Strain: Not on file  Food Insecurity: Food Insecurity Present (03/14/2021)   Hunger Vital Sign    Worried About Running Out of Food in the Last Year: Often true    Ran Out of Food in the Last Year: Sometimes true   Transportation Needs: No Transportation Needs (03/14/2021)   PRAPARE - Administrator, Civil Service (Medical): No    Lack of Transportation (Non-Medical): No  Physical Activity: Not on file  Stress: Not on file  Social Connections: Not on file   Past Surgical History:  Procedure Laterality Date   APPENDECTOMY     Past Surgical History:  Procedure Laterality Date   APPENDECTOMY     Past Medical History:  Diagnosis Date   Anxiety    Arthritis    Chronic kidney disease    Depression    Diabetes mellitus without complication (HCC)    Granuloma annulare    Hyperlipidemia    There were no vitals taken for this visit.  Opioid Risk Score:   Fall Risk Score:  `1  Depression screen [facility] 2/9     09/04/2022   10:26 AM 07/24/2022   10:07 AM 05/08/2022   11:34 AM 05/06/2022    9:39 AM 05/01/2022   11:38 AM 11/26/2021   10:46 AM 10/31/2021   11:19 AM  Depression screen PHQ 2/9  Decreased Interest 1 1 0 0 2 0 3  Down, Depressed, Hopeless 1 1 0 0 3 0 3  PHQ - 2 Score 2 2 0  0 5 0 6  Altered sleeping     3  3  Tired, decreased energy     3  3  Change in appetite     3  3  Feeling bad or failure about yourself      3  3  Trouble concentrating     2  3  Moving slowly or fidgety/restless     1  1  Suicidal thoughts     1  2  PHQ-9 Score     21  24    Review of Systems  Musculoskeletal:  Positive for back pain and gait problem.       Pain in both legs  All other systems reviewed and are negative.      Objective:   Physical Exam        Assessment & Plan:

## 2022-11-25 ENCOUNTER — Other Ambulatory Visit (HOSPITAL_COMMUNITY): Payer: Self-pay | Admitting: Psychiatry

## 2022-11-25 ENCOUNTER — Other Ambulatory Visit (HOSPITAL_COMMUNITY): Payer: Self-pay

## 2022-11-25 ENCOUNTER — Other Ambulatory Visit: Payer: Self-pay | Admitting: Family Medicine

## 2022-11-25 DIAGNOSIS — F3181 Bipolar II disorder: Secondary | ICD-10-CM

## 2022-11-25 DIAGNOSIS — I10 Essential (primary) hypertension: Secondary | ICD-10-CM

## 2022-11-25 DIAGNOSIS — F411 Generalized anxiety disorder: Secondary | ICD-10-CM

## 2022-11-25 DIAGNOSIS — E1165 Type 2 diabetes mellitus with hyperglycemia: Secondary | ICD-10-CM

## 2022-11-26 ENCOUNTER — Encounter: Payer: Self-pay | Admitting: Family Medicine

## 2022-11-26 ENCOUNTER — Other Ambulatory Visit: Payer: Self-pay

## 2022-11-26 ENCOUNTER — Ambulatory Visit: Payer: No Typology Code available for payment source | Attending: Family Medicine | Admitting: Family Medicine

## 2022-11-26 VITALS — BP 118/79 | HR 76 | Temp 98.2°F | Ht 64.0 in | Wt 211.6 lb

## 2022-11-26 DIAGNOSIS — Z7985 Long-term (current) use of injectable non-insulin antidiabetic drugs: Secondary | ICD-10-CM | POA: Diagnosis not present

## 2022-11-26 DIAGNOSIS — E1169 Type 2 diabetes mellitus with other specified complication: Secondary | ICD-10-CM

## 2022-11-26 DIAGNOSIS — Z23 Encounter for immunization: Secondary | ICD-10-CM | POA: Diagnosis not present

## 2022-11-26 DIAGNOSIS — G894 Chronic pain syndrome: Secondary | ICD-10-CM | POA: Diagnosis not present

## 2022-11-26 DIAGNOSIS — I10 Essential (primary) hypertension: Secondary | ICD-10-CM | POA: Diagnosis not present

## 2022-11-26 DIAGNOSIS — E1165 Type 2 diabetes mellitus with hyperglycemia: Secondary | ICD-10-CM | POA: Diagnosis not present

## 2022-11-26 DIAGNOSIS — F339 Major depressive disorder, recurrent, unspecified: Secondary | ICD-10-CM

## 2022-11-26 DIAGNOSIS — E785 Hyperlipidemia, unspecified: Secondary | ICD-10-CM | POA: Diagnosis not present

## 2022-11-26 LAB — POCT GLYCOSYLATED HEMOGLOBIN (HGB A1C): HbA1c, POC (controlled diabetic range): 6.2 % (ref 0.0–7.0)

## 2022-11-26 LAB — TOXASSURE SELECT,+ANTIDEPR,UR

## 2022-11-26 MED ORDER — SEMAGLUTIDE (1 MG/DOSE) 4 MG/3ML ~~LOC~~ SOPN
1.0000 mg | PEN_INJECTOR | SUBCUTANEOUS | 3 refills | Status: DC
Start: 2022-11-26 — End: 2023-05-19
  Filled 2022-11-26 (×2): qty 3, 28d supply, fill #0
  Filled 2023-01-21 (×2): qty 3, 28d supply, fill #1
  Filled 2023-02-19: qty 3, 28d supply, fill #2
  Filled 2023-03-18: qty 3, 28d supply, fill #3

## 2022-11-26 MED ORDER — AMLODIPINE BESYLATE 5 MG PO TABS
5.0000 mg | ORAL_TABLET | Freq: Every day | ORAL | 1 refills | Status: DC
Start: 2022-11-26 — End: 2023-12-19
  Filled 2022-11-26 (×2): qty 90, 90d supply, fill #0

## 2022-11-26 MED ORDER — ATORVASTATIN CALCIUM 40 MG PO TABS
40.0000 mg | ORAL_TABLET | Freq: Every day | ORAL | 1 refills | Status: DC
Start: 2022-11-26 — End: 2023-12-19
  Filled 2022-11-26 – 2022-12-22 (×3): qty 90, 90d supply, fill #0
  Filled 2023-11-19: qty 90, 90d supply, fill #1

## 2022-11-26 NOTE — Patient Instructions (Signed)
Chronic Pain, Adult Chronic pain is a type of pain that lasts or keeps coming back for at least 3-6 months. You may have headaches, pain in the abdomen, or pain in other areas of the body. Chronic pain may be related to an illness, injury, or a health condition. Sometimes, the cause of chronic pain is not known. Chronic pain can make it hard for you to do daily activities. If it is not treated, chronic pain can lead to anxiety and depression. Treatment depends on the cause of your pain and how severe it is. You may need to work with a pain specialist to come up with a treatment plan. Many people benefit from two or more types of treatment to control their pain. Follow these instructions at home: Treatment plan Follow your treatment plan as told by your health care provider. This may include: Gentle, regular exercise. Eating a healthy diet that includes foods such as vegetables, fruits, fish, and lean meats. Mental health therapy (cognitive or behavioral therapy) that changes the way you think or act in response to the pain. This may help improve how you feel. Doing physical therapy exercises to improve movement and strength. Meditation, yoga, acupuncture, or massage therapy. Using the oils from plants in your environment or on your skin (aromatherapy). Other treatments may include: Over-the-counter or prescription medicines. Color, light, or sound therapy. Local electrical stimulation. The electrical pulses help to relieve pain by temporarily stopping the nerve impulses that cause you to feel pain. Injections. These deliver numbing or pain-relieving medicines into the spine or the area of pain.  Medicines Take over-the-counter and prescription medicines only as told by your health care provider. Ask your health care provider if the medicine prescribed to you: Requires you to avoid driving or using machinery. Can cause constipation. You may need to take these actions to prevent or treat  constipation: Drink enough fluid to keep your urine pale yellow. Take over-the-counter or prescription medicines. Eat foods that are high in fiber, such as beans, whole grains, and fresh fruits and vegetables. Limit foods that are high in fat and processed sugars, such as fried or sweet foods. Lifestyle  Ask your health care provider whether you should keep a pain diary. Your health care provider will tell you what information to write in the diary. This may include: When you have pain. What the pain feels like. How medicines and other behaviors or treatments help to reduce the pain. Consider talking with a mental health care provider about how to help manage chronic pain. Consider joining a chronic pain support group. Try to control or lower your stress levels. Talk with your health care provider about ways to do this. General instructions Learn as much as you can about how to manage your chronic pain. Ask your health care provider if an intensive pain rehabilitation program or a chronic pain specialist would be helpful. Check your pain level as told by your health care provider. Ask your health care provider if you should use a pain scale. Contact a health care provider if: Your pain is not controlled with treatment. You have new pain. You have side effects from pain medicine. You feel weak or you have trouble doing your normal activities. You have trouble sleeping or you develop confusion. You lose feeling or have numbness in your body. You lose control of your bowels or bladder. Get help right away if: Your pain suddenly gets much worse. You develop chest pain. You have trouble breathing or shortness of  breath. You faint, or another person sees you faint. These symptoms may be an emergency. Get help right away. Call 911. Do not wait to see if the symptoms will go away. Do not drive yourself to the hospital. Also, get help right away if: You have thoughts about hurting yourself  or others. Take one of these steps if you feel like you may hurt yourself or others, or have thoughts about taking your own life: Go to your nearest emergency room. Call 911. Call the National Suicide Prevention Lifeline at 613-055-8912 or 988. This is open 24 hours a day. Text the Crisis Text Line at 323-193-1954. This information is not intended to replace advice given to you by your health care provider. Make sure you discuss any questions you have with your health care provider. Document Revised: 12/11/2021 Document Reviewed: 11/13/2021 Elsevier Patient Education  2024 [employer].

## 2022-11-26 NOTE — Progress Notes (Signed)
Subjective:  Patient ID: Karen Whitehead, female    DOB: 01/27/1966  Age: 57 y.o. MRN: [id number]  CC: Diabetes   HPI Karen Whitehead is a 57 y.o. year old female with a history of Type2 DM (A1c 6.2), anxiety and depression (managed by behavioral health), insomnia, Granuloma Annulare, cervical radiculopathy, neck low back pain.   Interval History: Discussed the use of AI scribe software for clinical note transcription with the patient, who gave verbal consent to proceed.   She reports running out of her antihypertensive medication and is due for a refill of her Ozempic and atorvastatin. She has been compliant with her medications and has noticed an improvement in her diabetes control since starting Ozempic. She has discontinued glipizide and is only taking Ozempic for her diabetes. She expresses a desire to lose weight and is willing to increase her Ozempic dose to 1mg  weekly.  She has been struggling with transportation issues and was unable to see her behavioral health provider due to a lapse in Medicaid coverage. She is still taking Risperdal, Zoloft, and Depakote for her mental health conditions. She expresses difficulty connecting with her assigned counselor and is planning to reschedule her appointment.  She continues to experience chronic back pain and is due for her back injections. She has been seeing physical medicine and rehab doctors for her chronic pain and is considering a spinal stimulator. She continues to smoke but has reduced her intake and is considering using a patch to quit.        Past Medical History:  Diagnosis Date   Anxiety    Arthritis    Chronic kidney disease    Depression    Diabetes mellitus without complication (HCC)    Granuloma annulare    Hyperlipidemia     Past Surgical History:  Procedure Laterality Date   APPENDECTOMY      Family History  Problem Relation Age of Onset   Stomach cancer Mother    Heart attack Father    Breast cancer Neg Hx     Colon cancer Neg Hx    Colon polyps Neg Hx     Social History   Socioeconomic History   Marital status: Single    Spouse name: Not on file   Number of children: 1   Years of education: Not on file   Highest education level: Some college, no degree  Occupational History   Not on file  Tobacco Use   Smoking status: Every Day    Current packs/day: 0.50    Types: Cigarettes   Smokeless tobacco: Never  Vaping Use   Vaping status: Never Used  Substance and Sexual Activity   Alcohol use: No   Drug use: No   Sexual activity: Not Currently  Other Topics Concern   Not on file  Social History Narrative   Not on file   Social Determinants of Health   Financial Resource Strain: Not on file  Food Insecurity: Food Insecurity Present (03/14/2021)   Hunger Vital Sign    Worried About Running Out of Food in the Last Year: Often true    Ran Out of Food in the Last Year: Sometimes true  Transportation Needs: No Transportation Needs (03/14/2021)   PRAPARE - Administrator, Civil Service (Medical): No    Lack of Transportation (Non-Medical): No  Physical Activity: Not on file  Stress: Not on file  Social Connections: Not on file    No Known Allergies  Outpatient Medications Prior  to Visit  Medication Sig Dispense Refill   amitriptyline (ELAVIL) 10 MG tablet Take 1 tablet (10 mg total) by mouth at bedtime. 30 tablet 1   Blood Glucose Monitoring Suppl (TRUE METRIX METER) w/Device KIT 1 each by Does not apply route in the morning and at bedtime. 1 kit 0   clonazePAM (KLONOPIN) 0.5 MG tablet Take 1 tablet (0.5 mg total) by mouth 2 (two) times daily as needed. for anxiety 60 tablet 2   cyclobenzaprine (FLEXERIL) 10 MG tablet Take 1 tablet (10 mg total) by mouth 2 (two) times daily as needed for muscle spasms as needed for muscle pain 60 tablet 6   divalproex (DEPAKOTE ER) 500 MG 24 hr tablet Take 1 tablet (500 mg total) by mouth at bedtime. 30 tablet 6   fluticasone  (CUTIVATE) 0.005 % ointment Apply topically 2 times daily. 30 g 0   gabapentin (NEURONTIN) 300 MG capsule Take 1 capsule (300 mg total) by mouth 3 (three) times daily. 90 capsule 6   glucose blood (TRUE METRIX BLOOD GLUCOSE TEST) test strip Use as instructed 100 each 12   HYDROcodone-acetaminophen (NORCO/VICODIN) 5-325 MG tablet Take 1 tablet by mouth 4 (four) times daily as needed for moderate pain. 120 tablet 0   ibuprofen (ADVIL) 200 MG tablet Take 200 mg by mouth every 6 (six) hours as needed for headache, mild pain or moderate pain.     lidocaine (LIDODERM) 5 % Place 2 patches onto the skin daily. Remove & Discard patch within 12 hours or as directed by MD 60 patch 11   risperiDONE (RISPERDAL) 1 MG tablet Take 1 tablet (1 mg total) by mouth 2 (two) times daily. 60 tablet 3   sertraline (ZOLOFT) 100 MG tablet TAKE 2 TABLETS BY MOUTH ONCE A DAY 60 tablet 3   SUMAtriptan (IMITREX) 50 MG tablet Take 1 tablet (50 mg total) by mouth every 2 (two) hours as needed for migraine. May repeat in 2 hours if headache persists or recurs. 12 tablet 6   topiramate (TOPAMAX) 50 MG tablet Take 1 tablet (50 mg total) by mouth 2 (two) times daily. 60 tablet 3   traZODone (DESYREL) 100 MG tablet TAKE 2 TABLETS BY MOUTH AT BEDTIME 60 tablet 3   TRUEplus Lancets 28G MISC Use as directed in the morning and at bedtime. 100 each 1   Vitamin D, Ergocalciferol, (DRISDOL) 1.25 MG (50000 UNIT) CAPS capsule Take 1 capsule (50,000 Units total) by mouth every 7 (seven) days. 7 capsule 0   amLODipine (NORVASC) 5 MG tablet Take 1 tablet (5 mg total) by mouth daily. 90 tablet 1   atorvastatin (LIPITOR) 40 MG tablet Take 1 tablet (40 mg total) by mouth daily. 90 tablet 3   Semaglutide,0.25 or 0.5MG /DOS, (OZEMPIC, 0.25 OR 0.5 MG/DOSE,) 2 MG/3ML SOPN Inject 0.5 mg into the skin once a week. 3 mL 0   albuterol (VENTOLIN HFA) 108 (90 Base) MCG/ACT inhaler INHALE 1-2 PUFFS INTO THE LUNGS EVERY 6 (SIX) HOURS AS NEEDED FOR WHEEZING OR  SHORTNESS OF BREATH. (Patient taking differently: Inhale 2 puffs into the lungs every 6 (six) hours as needed for wheezing or shortness of breath.) 18 g 1   amoxicillin (AMOXIL) 500 MG capsule Take 500 mg by mouth daily. (Patient not taking: Reported on 11/24/2022)     glipiZIDE (GLUCOTROL) 5 MG tablet TAKE 0.5 TABLETS (2.5 MG TOTAL) BY MOUTH 2 (TWO) TIMES DAILY BEFORE A MEAL. 30 tablet 6   Facility-Administered Medications Prior to Visit  Medication  Dose Route Frequency Provider Last Rate Last Admin   lidocaine (XYLOCAINE) 1 % (with pres) injection 3 mL  3 mL Intradermal Once Raulkar, Drema Pry, MD       lidocaine (XYLOCAINE) 1 % (with pres) injection 5 mL  5 mL Intradermal Once Raulkar, Drema Pry, MD       sodium chloride (PF) 0.9 % injection 2 mL  2 mL Intravenous Daily Raulkar, Drema Pry, MD   2 mL at 07/24/22 1138     ROS Review of Systems  Constitutional:  Negative for activity change and appetite change.  HENT:  Negative for sinus pressure and sore throat.   Respiratory:  Negative for chest tightness, shortness of breath and wheezing.   Cardiovascular:  Negative for chest pain and palpitations.  Gastrointestinal:  Negative for abdominal distention, abdominal pain and constipation.  Genitourinary: Negative.   Musculoskeletal:        See HPI  Psychiatric/Behavioral:  Negative for behavioral problems and dysphoric mood.     Objective:  BP 118/79   Pulse 76   Temp 98.2 F (36.8 C) (Oral)   Ht 5\' 4"  (1.626 m)   Wt 211 lb 9.6 oz (96 kg)   SpO2 98%   BMI 36.32 kg/m      11/26/2022    3:38 PM 11/26/2022    2:54 PM 11/24/2022    1:38 PM  BP/Weight  Systolic BP 118 140 118  Diastolic BP 79 85 86  Wt. (Lbs)  211.6 209  BMI  36.32 kg/m2 35.87 kg/m2      Physical Exam Constitutional:      Appearance: She is well-developed.  Cardiovascular:     Rate and Rhythm: Normal rate.     Heart sounds: Normal heart sounds. No murmur heard. Pulmonary:     Effort: Pulmonary effort is  normal.     Breath sounds: Normal breath sounds. No wheezing or rales.  Chest:     Chest wall: No tenderness.  Abdominal:     General: Bowel sounds are normal. There is no distension.     Palpations: Abdomen is soft. There is no mass.     Tenderness: There is no abdominal tenderness.  Musculoskeletal:        General: Tenderness (lumbar spine) present.     Right lower leg: No edema.     Left lower leg: No edema.     Comments: Reduced ROM of lumbar spine  Skin:    Comments: Large skin rashes in patches on forearm from granuloma annulare  Neurological:     Mental Status: She is alert and oriented to person, place, and time.  Psychiatric:        Mood and Affect: Mood normal.        Latest Ref Rng & Units 05/01/2022   12:21 PM 10/31/2021   12:02 PM 12/17/2020    4:24 PM  CMP  Glucose 70 - 99 mg/dL 161  096  75   BUN 6 - 24 mg/dL 8  12  14    Creatinine 0.57 - 1.00 mg/dL 0.45  4.09  8.11   Sodium 134 - 144 mmol/L 143  142  136   Potassium 3.5 - 5.2 mmol/L 4.6  4.5  4.1   Chloride 96 - 106 mmol/L 101  101  100   CO2 20 - 29 mmol/L 22  25  24    Calcium 8.7 - 10.2 mg/dL 91.4  78.2  9.6   Total Protein 6.0 - 8.5 g/dL 7.6  7.4    Total Bilirubin 0.0 - 1.2 mg/dL 0.4  0.3    Alkaline Phos 44 - 121 IU/L 78  81    AST 0 - 40 IU/L 20  17    ALT 0 - 32 IU/L 25  22      Lipid Panel     Component Value Date/Time   CHOL 162 10/31/2021 1202   TRIG 101 10/31/2021 1202   HDL 49 10/31/2021 1202   CHOLHDL 6.0 (H) 10/31/2020 1004   LDLCALC 94 10/31/2021 1202    CBC    Component Value Date/Time   WBC 10.5 10/16/2019 1515   RBC 4.82 10/16/2019 1515   HGB 15.6 (H) 10/16/2019 1751   HCT 46.0 10/16/2019 1751   PLT 289 10/16/2019 1515   MCV 92.5 10/16/2019 1515   MCH 31.5 10/16/2019 1515   MCHC 34.1 10/16/2019 1515   RDW 12.6 10/16/2019 1515   LYMPHSABS 3.0 11/10/2018 1315   MONOABS 0.6 11/10/2018 1315   EOSABS 0.3 11/10/2018 1315   BASOSABS 0.1 11/10/2018 1315    Lab Results   Component Value Date   HGBA1C 6.2 11/26/2022    Assessment & Plan:      Hypertension: Patient ran out of medication. Blood pressure elevated at today's visit. -Refill Amlodipine 90-day supply  -Counseled on blood pressure goal of less than 130/80, low-sodium, DASH diet, medication compliance, 150 minutes of moderate intensity exercise per week. Discussed medication compliance, adverse effects.  Type 2 Diabetes Mellitus: Well controlled on Ozempic. Patient has discontinued Glipizide. Expressed desire for weight loss. -Discontinue Glipizide from medication list. -Increase Ozempic from 0.5mg  to 1mg  weekly for potential additional weight loss benefit. -Refill Ozempic 90-day supply   Hyperlipidemia: Patient needs refill of Atorvastatin. -Refill Atorvastatin 90-day supply  -Plan for fasting lipid panel at next visit.  Anxiety and Depression: Patient has not been able to attend behavioral health appointments due to transportation issues. Continues to take Risperdal, Zoloft, and Depakote. -Encourage patient to reschedule behavioral health appointment and discuss transportation issues with them.  Chronic Pain: Patient has been unable to receive regular back injections due to insurance issues. Continues to see physical medicine and rehab doctors. -Continue current management with physical medicine and rehab.  Tobacco Use: Patient continues to smoke but has reduced amount. Expressed interest in trying nicotine patch. -Encourage patient to consider nicotine patch or other smoking cessation aids when ready.  General Health Maintenance: -Order urine microalbumin and other routine blood work today. -Plan for fasting lipid panel at next visit. -Follow-up in 6 months.           Meds ordered this encounter  Medications   amLODipine (NORVASC) 5 MG tablet    Sig: Take 1 tablet (5 mg total) by mouth daily.    Dispense:  90 tablet    Refill:  1    Dose increase   Semaglutide, 1 MG/DOSE, 4  MG/3ML SOPN    Sig: Inject 1 mg as directed once a week.    Dispense:  3 mL    Refill:  3    Discontinue 0.5mg    atorvastatin (LIPITOR) 40 MG tablet    Sig: Take 1 tablet (40 mg total) by mouth daily.    Dispense:  90 tablet    Refill:  1    Discontinue 20 mg    Follow-up: Return in about 6 months (around 05/29/2023) for Chronic medical conditions.       Hoy Register, MD, FAAFP. [facility]  [place], [place] [phone]   11/26/2022, 5:56 PM

## 2022-11-27 ENCOUNTER — Other Ambulatory Visit: Payer: Self-pay

## 2022-11-27 ENCOUNTER — Other Ambulatory Visit (HOSPITAL_COMMUNITY): Payer: Self-pay

## 2022-11-27 LAB — CMP14+EGFR
AST: 31 IU/L (ref 0–40)
Albumin: 4.6 g/dL (ref 3.8–4.9)
BUN: 11 mg/dL (ref 6–24)
Bilirubin Total: 0.3 mg/dL (ref 0.0–1.2)
CO2: 25 mmol/L (ref 20–29)
Calcium: 9.5 mg/dL (ref 8.7–10.2)
Chloride: 98 mmol/L (ref 96–106)
Creatinine, Ser: 0.95 mg/dL (ref 0.57–1.00)
Globulin, Total: 2.8 g/dL (ref 1.5–4.5)
Glucose: 94 mg/dL (ref 70–99)
Potassium: 4.5 mmol/L (ref 3.5–5.2)
Sodium: 138 mmol/L (ref 134–144)
Total Protein: 7.4 g/dL (ref 6.0–8.5)
eGFR: 70 mL/min/{1.73_m2} (ref >59)

## 2022-11-27 LAB — MICROALBUMIN / CREATININE URINE RATIO
Microalb/Creat Ratio: 27 mg/g creat (ref 0–29)
Microalbumin, Urine: 8.8 ug/mL

## 2022-12-01 ENCOUNTER — Telehealth: Payer: Self-pay | Admitting: *Deleted

## 2022-12-01 NOTE — Telephone Encounter (Signed)
Urine drug screen for this encounter is consistent for prescribed medication. Her last dose of hydrocodone was reported as 11/05/22 and test was done on 11/24/22 so negative for narcotic would be appropriate.

## 2022-12-04 ENCOUNTER — Other Ambulatory Visit: Payer: Self-pay

## 2022-12-04 ENCOUNTER — Encounter
Payer: No Typology Code available for payment source | Attending: Physical Medicine and Rehabilitation | Admitting: Physical Medicine and Rehabilitation

## 2022-12-04 VITALS — BP 142/90 | HR 81 | Ht 64.0 in | Wt 208.0 lb

## 2022-12-04 DIAGNOSIS — M47817 Spondylosis without myelopathy or radiculopathy, lumbosacral region: Secondary | ICD-10-CM | POA: Insufficient documentation

## 2022-12-04 DIAGNOSIS — Z5181 Encounter for therapeutic drug level monitoring: Secondary | ICD-10-CM | POA: Insufficient documentation

## 2022-12-04 DIAGNOSIS — G894 Chronic pain syndrome: Secondary | ICD-10-CM | POA: Insufficient documentation

## 2022-12-04 DIAGNOSIS — G8929 Other chronic pain: Secondary | ICD-10-CM | POA: Diagnosis present

## 2022-12-04 DIAGNOSIS — Z79899 Other long term (current) drug therapy: Secondary | ICD-10-CM | POA: Insufficient documentation

## 2022-12-04 DIAGNOSIS — M546 Pain in thoracic spine: Secondary | ICD-10-CM | POA: Insufficient documentation

## 2022-12-04 DIAGNOSIS — M7918 Myalgia, other site: Secondary | ICD-10-CM | POA: Diagnosis not present

## 2022-12-04 DIAGNOSIS — F17210 Nicotine dependence, cigarettes, uncomplicated: Secondary | ICD-10-CM | POA: Insufficient documentation

## 2022-12-04 MED ORDER — LIDOCAINE HCL 1 % IJ SOLN
3.0000 mL | Freq: Once | INTRAMUSCULAR | Status: AC
Start: 2022-12-04 — End: 2022-12-04
  Administered 2022-12-04: 3 mL

## 2022-12-04 MED ORDER — SODIUM CHLORIDE (PF) 0.9 % IJ SOLN
2.0000 mL | Freq: Once | INTRAMUSCULAR | Status: AC
Start: 2022-12-04 — End: 2022-12-04
  Administered 2022-12-04: 2 mL via INTRAVENOUS

## 2022-12-04 MED ORDER — HYDROCODONE-ACETAMINOPHEN 5-325 MG PO TABS
1.0000 | ORAL_TABLET | Freq: Four times a day (QID) | ORAL | 0 refills | Status: DC | PRN
  Filled 2022-12-04: qty 120, 30d supply, fill #0

## 2022-12-04 NOTE — Addendum Note (Signed)
Addended by: Silas Sacramento T on: 12/04/2022 12:03 PM   Modules accepted: Orders

## 2022-12-04 NOTE — Progress Notes (Signed)
Trigger Point Injection  Indication: Lumbar myofascial pain not relieved by medication management and other conservative care.  Informed consent was obtained after describing risk and benefits of the procedure with the patient, this includes bleeding, bruising, infection and medication side effects.  The patient wishes to proceed and has given written consent.  The patient was placed in a seated position.  The area of pain was marked and prepped with Betadine.  It was entered with a 25-gauge 1/2 inch needle and a total of 5 mL of 1% lidocaine and normal saline was injected into a total of 4 trigger points, after negative draw back for blood.  The patient tolerated the procedure well.  Post procedure instructions were given.   Prescribed Zynex Nexwave

## 2022-12-17 ENCOUNTER — Encounter (HOSPITAL_COMMUNITY): Payer: Self-pay

## 2022-12-17 ENCOUNTER — Emergency Department (HOSPITAL_COMMUNITY): Payer: No Typology Code available for payment source

## 2022-12-17 ENCOUNTER — Emergency Department (HOSPITAL_COMMUNITY)
Admission: EM | Admit: 2022-12-17 | Discharge: 2022-12-18 | Disposition: A | Discharge status: Home / Self Care | Payer: No Typology Code available for payment source | Attending: Emergency Medicine | Admitting: Emergency Medicine

## 2022-12-17 ENCOUNTER — Other Ambulatory Visit: Payer: Self-pay

## 2022-12-17 DIAGNOSIS — E119 Type 2 diabetes mellitus without complications: Secondary | ICD-10-CM | POA: Diagnosis not present

## 2022-12-17 DIAGNOSIS — I10 Essential (primary) hypertension: Secondary | ICD-10-CM | POA: Insufficient documentation

## 2022-12-17 DIAGNOSIS — E876 Hypokalemia: Secondary | ICD-10-CM | POA: Insufficient documentation

## 2022-12-17 DIAGNOSIS — U071 COVID-19: Secondary | ICD-10-CM | POA: Insufficient documentation

## 2022-12-17 DIAGNOSIS — R112 Nausea with vomiting, unspecified: Secondary | ICD-10-CM

## 2022-12-17 DIAGNOSIS — R509 Fever, unspecified: Secondary | ICD-10-CM | POA: Diagnosis present

## 2022-12-17 DIAGNOSIS — K760 Fatty (change of) liver, not elsewhere classified: Secondary | ICD-10-CM | POA: Diagnosis not present

## 2022-12-17 DIAGNOSIS — R1013 Epigastric pain: Secondary | ICD-10-CM | POA: Diagnosis not present

## 2022-12-17 LAB — COMPREHENSIVE METABOLIC PANEL
ALT: 27 U/L (ref 0–44)
AST: 22 U/L (ref 15–41)
Albumin: 4.1 g/dL (ref 3.5–5.0)
Alkaline Phosphatase: 44 U/L (ref 38–126)
Anion gap: 12 (ref 5–15)
BUN: 5 mg/dL — ABNORMAL LOW (ref 6–20)
CO2: 29 mmol/L (ref 22–32)
Calcium: 9.3 mg/dL (ref 8.9–10.3)
Chloride: 97 mmol/L — ABNORMAL LOW (ref 98–111)
Creatinine, Ser: 0.83 mg/dL (ref 0.44–1.00)
GFR, Estimated: >60 mL/min (ref >60)
Glucose, Bld: 110 mg/dL — ABNORMAL HIGH (ref 70–99)
Potassium: 2.8 mmol/L — ABNORMAL LOW (ref 3.5–5.1)
Sodium: 138 mmol/L (ref 135–145)
Total Bilirubin: 0.9 mg/dL (ref 0.3–1.2)
Total Protein: 7.6 g/dL (ref 6.5–8.1)

## 2022-12-17 LAB — CBC
HCT: 44.5 % (ref 36.0–46.0)
Hemoglobin: 15.4 g/dL — ABNORMAL HIGH (ref 12.0–15.0)
MCH: 30.1 pg (ref 26.0–34.0)
MCHC: 34.6 g/dL (ref 30.0–36.0)
MCV: 86.9 fL (ref 80.0–100.0)
Platelets: 290 10*3/uL (ref 150–400)
RBC: 5.12 MIL/uL — ABNORMAL HIGH (ref 3.87–5.11)
RDW: 12.4 % (ref 11.5–15.5)
WBC: 10 10*3/uL (ref 4.0–10.5)
nRBC: 0 % (ref 0.0–0.2)

## 2022-12-17 LAB — URINALYSIS, ROUTINE W REFLEX MICROSCOPIC
Bilirubin Urine: NEGATIVE
Glucose, UA: NEGATIVE mg/dL
Ketones, ur: NEGATIVE mg/dL
Leukocytes,Ua: NEGATIVE
Nitrite: NEGATIVE
Protein, ur: NEGATIVE mg/dL
Specific Gravity, Urine: 1.008 (ref 1.005–1.030)
pH: 6 (ref 5.0–8.0)

## 2022-12-17 LAB — HCG, SERUM, QUALITATIVE: Preg, Serum: NEGATIVE

## 2022-12-17 LAB — LIPASE, BLOOD: Lipase: 32 U/L (ref 11–51)

## 2022-12-17 MED ORDER — PANTOPRAZOLE SODIUM 40 MG IV SOLR
40.0000 mg | Freq: Once | INTRAVENOUS | Status: AC
  Administered 2022-12-17: 40 mg via INTRAVENOUS
  Filled 2022-12-17: qty 10

## 2022-12-17 MED ORDER — ONDANSETRON 4 MG PO TBDP
4.0000 mg | ORAL_TABLET | Freq: Once | ORAL | Status: AC | PRN
  Administered 2022-12-17: 4 mg via ORAL
  Filled 2022-12-17: qty 1

## 2022-12-17 MED ORDER — POTASSIUM CHLORIDE CRYS ER 20 MEQ PO TBCR
40.0000 meq | EXTENDED_RELEASE_TABLET | Freq: Once | ORAL | Status: AC
  Administered 2022-12-18: 40 meq via ORAL
  Filled 2022-12-17: qty 2

## 2022-12-17 MED ORDER — ONDANSETRON 4 MG PO TBDP
4.0000 mg | ORAL_TABLET | Freq: Three times a day (TID) | ORAL | 0 refills | Status: AC | PRN
  Filled 2022-12-17 – 2022-12-18 (×2): qty 20, 7d supply, fill #0

## 2022-12-17 MED ORDER — SODIUM CHLORIDE 0.9 % IV BOLUS
1000.0000 mL | Freq: Once | INTRAVENOUS | Status: AC
  Administered 2022-12-17: 1000 mL via INTRAVENOUS

## 2022-12-17 NOTE — ED Triage Notes (Signed)
Pt reports she has been vomiting for a week and unable to keep anything down associated with epigastric pain. She also reports a headache and states when she vomits her chest hurts and the pain radiates down her left arm.

## 2022-12-17 NOTE — ED Provider Notes (Signed)
[place] EMERGENCY DEPARTMENT AT [facility] Provider Note   CSN: [id number] Arrival date & time: 12/17/22  1538     History {Add pertinent medical, surgical, social history, OB history to HPI:1} Chief Complaint  Patient presents with   Emesis    Karen Whitehead is a 57 y.o. female.  She has a history of hypertension diabetes hyperlipidemia.  She said she had a high fever last week.  This week she has had multiple days of nausea and vomiting.  Causing pain in her upper abdomen into her chest.  No diarrhea.  Feeling generally weak.  Family members at home have COVID, she tested negative.  The history is provided by the patient.  Emesis Severity:  Severe Duration:  4 days Timing:  Intermittent Quality:  Stomach contents Progression:  Unchanged Chronicity:  New Relieved by:  None tried Worsened by:  Liquids Ineffective treatments:  None tried Associated symptoms: abdominal pain, fever and headaches   Associated symptoms: no cough and no diarrhea   Risk factors: sick contacts   Risk factors: no travel to endemic areas        Home Medications Prior to Admission medications   Medication Sig Start Date End Date Taking? Authorizing Provider  albuterol (VENTOLIN HFA) 108 (90 Base) MCG/ACT inhaler INHALE 1-2 PUFFS INTO THE LUNGS EVERY 6 (SIX) HOURS AS NEEDED FOR WHEEZING OR SHORTNESS OF BREATH. Patient taking differently: Inhale 2 puffs into the lungs every 6 (six) hours as needed for wheezing or shortness of breath. 01/31/21 01/31/22  Hoy Register, MD  amitriptyline (ELAVIL) 10 MG tablet Take 1 tablet (10 mg total) by mouth at bedtime. 10/06/22   Raulkar, Drema Pry, MD  amLODipine (NORVASC) 5 MG tablet Take 1 tablet (5 mg total) by mouth daily. 11/26/22   Hoy Register, MD  amoxicillin (AMOXIL) 500 MG capsule Take 500 mg by mouth daily. Patient not taking: Reported on 11/24/2022 06/17/22   [provider]  atorvastatin (LIPITOR) 40 MG tablet Take 1 tablet (40 mg  total) by mouth daily. 11/26/22   Hoy Register, MD  Blood Glucose Monitoring Suppl (TRUE METRIX METER) w/Device KIT 1 each by Does not apply route in the morning and at bedtime. 11/03/19   Anders Simmonds, PA-C  clonazePAM (KLONOPIN) 0.5 MG tablet Take 1 tablet (0.5 mg total) by mouth 2 (two) times daily as needed. for anxiety 05/08/21   Shanna Cisco, NP  cyclobenzaprine (FLEXERIL) 10 MG tablet Take 1 tablet (10 mg total) by mouth 2 (two) times daily as needed for muscle spasms as needed for muscle pain 10/31/20   Hoy Register, MD  divalproex (DEPAKOTE ER) 500 MG 24 hr tablet Take 1 tablet (500 mg total) by mouth at bedtime. 05/21/21   Toy Cookey E, NP  fluticasone (CUTIVATE) 0.005 % ointment Apply topically 2 times daily. 10/31/21   Hoy Register, MD  gabapentin (NEURONTIN) 300 MG capsule Take 1 capsule (300 mg total) by mouth 3 (three) times daily. 04/12/21   Raulkar, Drema Pry, MD  glucose blood (TRUE METRIX BLOOD GLUCOSE TEST) test strip Use as instructed 05/09/20   Hoy Register, MD  HYDROcodone-acetaminophen (NORCO/VICODIN) 5-325 MG tablet Take 1 tablet by mouth 4 (four) times daily as needed for moderate pain. 12/04/22   Raulkar, Drema Pry, MD  ibuprofen (ADVIL) 200 MG tablet Take 200 mg by mouth every 6 (six) hours as needed for headache, mild pain or moderate pain.    [provider]  lidocaine (LIDODERM) 5 % Place  2 patches onto the skin daily. Remove & Discard patch within 12 hours or as directed by MD 04/12/21   Raulkar, Drema Pry, MD  risperiDONE (RISPERDAL) 1 MG tablet Take 1 tablet (1 mg total) by mouth 2 (two) times daily. 10/31/21   Shanna Cisco, NP  Semaglutide, 1 MG/DOSE, 4 MG/3ML SOPN Inject 1 mg as directed once a week. 11/26/22   Hoy Register, MD  sertraline (ZOLOFT) 100 MG tablet TAKE 2 TABLETS BY MOUTH ONCE A DAY 10/31/21   Toy Cookey E, NP  SUMAtriptan (IMITREX) 50 MG tablet Take 1 tablet (50 mg total) by mouth every 2 (two) hours as needed  for migraine. May repeat in 2 hours if headache persists or recurs. 12/21/20   Levert Feinstein, MD  topiramate (TOPAMAX) 50 MG tablet Take 1 tablet (50 mg total) by mouth 2 (two) times daily. 03/08/21   Raulkar, Drema Pry, MD  traZODone (DESYREL) 100 MG tablet TAKE 2 TABLETS BY MOUTH AT BEDTIME 10/31/21   Toy Cookey E, NP  TRUEplus Lancets 28G MISC Use as directed in the morning and at bedtime. 11/03/19   Anders Simmonds, PA-C  Vitamin D, Ergocalciferol, (DRISDOL) 1.25 MG (50000 UNIT) CAPS capsule Take 1 capsule (50,000 Units total) by mouth every 7 (seven) days. 04/12/21   Raulkar, Drema Pry, MD  potassium chloride (K-DUR) 10 MEQ tablet Take 1 tablet (10 mEq total) by mouth daily. Patient not taking: Reported on 02/19/2016 10/15/14 11/10/18  Myra Rude, MD      Allergies    Patient has no known allergies.    Review of Systems   Review of Systems  Constitutional:  Positive for fatigue and fever.  Respiratory:  Negative for cough.   Cardiovascular:  Positive for chest pain.  Gastrointestinal:  Positive for abdominal pain, nausea and vomiting. Negative for diarrhea.  Genitourinary:  Negative for dysuria.  Neurological:  Positive for headaches.    Physical Exam Updated Vital Signs BP (!) 178/103 (BP Location: Right Arm)   Pulse 94   Temp 98.6 F (37 C)   Resp 18   Ht 5\' 4"  (1.626 m)   Wt 94.3 kg   LMP  (LMP Unknown)   SpO2 97%   BMI 35.70 kg/m  Physical Exam Vitals and nursing note reviewed.  Constitutional:      General: She is not in acute distress.    Appearance: Normal appearance. She is well-developed.  HENT:     Head: Normocephalic and atraumatic.  Eyes:     Conjunctiva/sclera: Conjunctivae normal.  Cardiovascular:     Rate and Rhythm: Normal rate and regular rhythm.     Heart sounds: No murmur heard. Pulmonary:     Effort: Pulmonary effort is normal. No respiratory distress.     Breath sounds: Normal breath sounds.  Abdominal:     Palpations: Abdomen is soft.      Tenderness: There is no abdominal tenderness. There is no guarding or rebound.  Musculoskeletal:        General: No deformity.     Cervical back: Neck supple.  Skin:    General: Skin is warm and dry.     Capillary Refill: Capillary refill takes less than 2 seconds.  Neurological:     General: No focal deficit present.     Mental Status: She is alert.     Gait: Gait normal.     ED Results / Procedures / Treatments   Labs (all labs ordered are listed, but only abnormal results  are displayed) Labs Reviewed  COMPREHENSIVE METABOLIC PANEL - Abnormal; Notable for the following components:      Result Value   Potassium 2.8 (*)    Chloride 97 (*)    Glucose, Bld 110 (*)    BUN 5 (*)    All other components within normal limits  CBC - Abnormal; Notable for the following components:   RBC 5.12 (*)    Hemoglobin 15.4 (*)    All other components within normal limits  LIPASE, BLOOD  HCG, SERUM, QUALITATIVE  URINALYSIS, ROUTINE W REFLEX MICROSCOPIC  MAGNESIUM    EKG EKG Interpretation Date/Time:  Wednesday December 17 2022 16:48:10 EDT Ventricular Rate:  93 PR Interval:  154 QRS Duration:  64 QT Interval:  330 QTC Calculation: 410 R Axis:   42  Text Interpretation: Normal sinus rhythm Nonspecific T wave abnormality Abnormal ECG When compared with ECG of 16-Oct-2019 15:00, No significant change since last tracing Confirmed by Meridee Score [phone]) on 12/17/2022 9:43:24 PM  Radiology No results found.  Procedures Procedures  {Document cardiac monitor, telemetry assessment procedure when appropriate:1}  Medications Ordered in ED Medications  sodium chloride 0.9 % bolus 1,000 mL (has no administration in time range)  pantoprazole (PROTONIX) injection 40 mg (has no administration in time range)  ondansetron (ZOFRAN-ODT) disintegrating tablet 4 mg (4 mg Oral Given 12/17/22 1652)    ED Course/ Medical Decision Making/ A&P   {   Click here for ABCD2, HEART and other  calculatorsREFRESH Note before signing :1}                              Medical Decision Making Amount and/or Complexity of Data Reviewed Labs: ordered. Radiology: ordered.  Risk Prescription drug management.   This patient complains of ***; this involves an extensive number of treatment Options and is a complaint that carries with it a high risk of complications and morbidity. The differential includes ***  I ordered, reviewed and interpreted labs, which included *** I ordered medication *** and reviewed PMP when indicated. I ordered imaging studies which included *** and I independently    visualized and interpreted imaging which showed *** Additional history obtained from *** Previous records obtained and reviewed *** I consulted *** and discussed lab and imaging findings and discussed disposition.  Cardiac monitoring reviewed, *** Social determinants considered, *** Critical Interventions: ***  After the interventions stated above, I reevaluated the patient and found *** Admission and further testing considered, ***   {Document critical care time when appropriate:1} {Document review of labs and clinical decision tools ie heart score, Chads2Vasc2 etc:1}  {Document your independent review of radiology images, and any outside records:1} {Document your discussion with family members, caretakers, and with consultants:1} {Document social determinants of health affecting pt's care:1} {Document your decision making why or why not admission, treatments were needed:1} Final Clinical Impression(s) / ED Diagnoses Final diagnoses:  None    Rx / DC Orders ED Discharge Orders     None

## 2022-12-18 ENCOUNTER — Other Ambulatory Visit (HOSPITAL_COMMUNITY): Payer: Self-pay

## 2022-12-18 ENCOUNTER — Other Ambulatory Visit: Payer: Self-pay

## 2022-12-18 LAB — SARS CORONAVIRUS 2 BY RT PCR: SARS Coronavirus 2 by RT PCR: POSITIVE — AB

## 2022-12-18 LAB — TROPONIN I (HIGH SENSITIVITY): Troponin I (High Sensitivity): 5 ng/L (ref <18)

## 2022-12-18 LAB — MAGNESIUM: Magnesium: 1.9 mg/dL (ref 1.7–2.4)

## 2022-12-19 ENCOUNTER — Encounter: Payer: No Typology Code available for payment source | Admitting: Registered Nurse

## 2022-12-22 ENCOUNTER — Other Ambulatory Visit (HOSPITAL_COMMUNITY): Payer: Self-pay

## 2022-12-22 ENCOUNTER — Other Ambulatory Visit: Payer: Self-pay

## 2022-12-23 ENCOUNTER — Telehealth: Payer: Self-pay | Admitting: *Deleted

## 2022-12-23 ENCOUNTER — Encounter: Payer: Self-pay | Admitting: Registered Nurse

## 2022-12-23 ENCOUNTER — Telehealth: Payer: Self-pay

## 2022-12-23 ENCOUNTER — Encounter: Payer: No Typology Code available for payment source | Admitting: Registered Nurse

## 2022-12-23 VITALS — Ht 64.0 in | Wt 208.0 lb

## 2022-12-23 DIAGNOSIS — Z79899 Other long term (current) drug therapy: Secondary | ICD-10-CM

## 2022-12-23 DIAGNOSIS — M47817 Spondylosis without myelopathy or radiculopathy, lumbosacral region: Secondary | ICD-10-CM | POA: Diagnosis not present

## 2022-12-23 DIAGNOSIS — M7918 Myalgia, other site: Secondary | ICD-10-CM

## 2022-12-23 DIAGNOSIS — G894 Chronic pain syndrome: Secondary | ICD-10-CM

## 2022-12-23 DIAGNOSIS — M65812 Other synovitis and tenosynovitis, left shoulder: Secondary | ICD-10-CM | POA: Diagnosis not present

## 2022-12-23 DIAGNOSIS — M542 Cervicalgia: Secondary | ICD-10-CM

## 2022-12-23 DIAGNOSIS — Z5181 Encounter for therapeutic drug level monitoring: Secondary | ICD-10-CM | POA: Diagnosis not present

## 2022-12-23 DIAGNOSIS — G8929 Other chronic pain: Secondary | ICD-10-CM

## 2022-12-23 MED ORDER — HYDROCODONE-ACETAMINOPHEN 5-325 MG PO TABS: 1.0000 | ORAL_TABLET | Freq: Four times a day (QID) | ORAL | 0 refills | Status: DC | PRN

## 2022-12-23 NOTE — Progress Notes (Signed)
Subjective:    Patient ID: Karen Whitehead, female    DOB: [birth year], 57 y.o.   MRN: [id number]  HPI: LUJUANA Whitehead is a 57 y.o. female who  called office requesting Virtual Visit, she was diagnosed with COVID.  I connected with  Ms. Tymika S Lant @ by a video enabled telemedicine application and verified that I am speaking with the correct person using two identifiers.  Location: Patient: In her Home  Provider: In the office    I discussed the limitations of evaluation and management by telemedicine and the availability of in person appointments. The patient expressed understanding and agreed to proceed.  She states  her pain is located in her mid- lower back . She rates her pain 7. Her current exercise regime is walking and performing stretching exercises.  Ms. Ellstrom Morphine equivalent is 20.00 MME.   Last UDS was Performed on 11/24/2022, it was consistent.      Pain Inventory Average Pain 9 Pain Right Now 7 My pain is intermittent, constant, sharp, burning, dull, stabbing, tingling, and aching  In the last 24 hours, has pain interfered with the following? General activity 10 Relation with others 0 Enjoyment of life 8 What TIME of day is your pain at its worst? morning , daytime, evening, night, and varies Sleep (in general) Poor  Pain is worse with: walking, bending, sitting, standing, and some activites Pain improves with: rest, medication, and heat Relief from Meds: 6  Family History  Problem Relation Age of Onset   Stomach cancer Mother    Heart attack Father    Breast cancer Neg Hx    Colon cancer Neg Hx    Colon polyps Neg Hx    Social History   Socioeconomic History   Marital status: Single    Spouse name: Not on file   Number of children: 1   Years of education: Not on file   Highest education level: Some college, no degree  Occupational History   Not on file  Tobacco Use   Smoking status: Every Day    Current packs/day: 0.50    Types: Cigarettes    Smokeless tobacco: Never  Vaping Use   Vaping status: Never Used  Substance and Sexual Activity   Alcohol use: No   Drug use: No   Sexual activity: Not Currently  Other Topics Concern   Not on file  Social History Narrative   Not on file   Social Determinants of Health   Financial Resource Strain: Not on file  Food Insecurity: Food Insecurity Present (03/14/2021)   Hunger Vital Sign    Worried About Running Out of Food in the Last Year: Often true    Ran Out of Food in the Last Year: Sometimes true  Transportation Needs: No Transportation Needs (03/14/2021)   PRAPARE - Administrator, Civil Service (Medical): No    Lack of Transportation (Non-Medical): No  Physical Activity: Not on file  Stress: Not on file  Social Connections: Not on file   Past Surgical History:  Procedure Laterality Date   APPENDECTOMY     Past Surgical History:  Procedure Laterality Date   APPENDECTOMY     Past Medical History:  Diagnosis Date   Anxiety    Arthritis    Chronic kidney disease    Depression    Diabetes mellitus without complication (HCC)    Granuloma annulare    Hyperlipidemia    LMP  (LMP Unknown)   Opioid  Risk Score:   Fall Risk Score:  `1  Depression screen [facility] 2/9     11/26/2022    2:56 PM 11/24/2022    1:44 PM 09/04/2022   10:26 AM 07/24/2022   10:07 AM 05/08/2022   11:34 AM 05/06/2022    9:39 AM 05/01/2022   11:38 AM  Depression screen PHQ 2/9  Decreased Interest 3 1 1 1  0 0 2  Down, Depressed, Hopeless 2 1 1 1  0 0 3  PHQ - 2 Score 5 2 2 2  0 0 5  Altered sleeping 3      3  Tired, decreased energy 3      3  Change in appetite 1      3  Feeling bad or failure about yourself  3      3  Trouble concentrating 3      2  Moving slowly or fidgety/restless 1      1  Suicidal thoughts 1      1  PHQ-9 Score 20      21    Review of Systems  Musculoskeletal:  Positive for back pain, gait problem and neck pain.  All other systems reviewed and are  negative.      Objective:   Physical Exam Vitals and nursing note reviewed.  Musculoskeletal:     Comments: No Physical Exam Performed Virtual Visit          Assessment & Plan:  Cervicalgia: No complaints today. Continue HEP as Tolerated. Continue to alternate Heat and Ice Therapy. Continue to Monitor. 12/23/2022 Left Shoulder Tendonitis: No Complaints today. Continue to Alternate Ice and Heat Therapy. Continue HEP as Tolerated. Continue to Monitor. 12/23/2022 Chronic Bilateral Thoracic Back Pain: Continue HEP as Tolerated. Continue current medication regimen. Continue to Monitor. 12/23/2022 Lumbosacral Spondylosis without Myelopathy : S/P Right lumbar L2, L3, L4 medial branch blocks under fluoroscopic guidance: on 03/21/2021 .Continue to Monitor. 12/23/2022 Chronic Pain Syndrome:, Refilled:  Hydrocodone 5mg /325 one tablet 4 times a day as needed for pain #120. We will continue the opioid monitoring program, this consists of regular clinic visits, examinations, urine drug screen, pill counts as well as use of [place] Controlled Substance Reporting system. A 12 month History has been reviewed on the [place] Controlled Substance Reporting System on 12/23/2022  Myofacial Pain Syndrome:S/P Trigger Point Injection, with good relief noted   Continue Flexeril. Continue to Monitor. 12/23/2022  Virtual Visit Established Patient  Location of Patient: In her Home  Location of Provider: In he Office

## 2022-12-23 NOTE — Transitions of Care (Post Inpatient/ED Visit) (Addendum)
   12/23/2022  Name: Karen Whitehead MRN: [id number] DOB: [birth year]  Today's TOC FU Call Status: Today's TOC FU Call Status:: Unsuccessful Call (1st Attempt) Unsuccessful Call (1st Attempt) Date: 12/23/22  Red on EMMI-ED Discharge Alert Date & Reason:12/19/22 "Scheduled follow-up appt? No"   Attempted to reach the patient regarding the most recent Inpatient/ED visit.  Follow Up Plan: Additional outreach attempts will be made to reach the patient to complete the Transitions of Care (Post Inpatient/ED visit) call.     Antionette Fairy, RN,BSN,CCM [facility] Care Management Care Management Community Coordinator Direct Phone: [phone] Toll Free: [phone] Fax: [phone]

## 2022-12-23 NOTE — Progress Notes (Signed)
  Care Coordination   Note   12/23/2022 Name: Karen Whitehead MRN: [id number] DOB: 1965/07/17  [name] is a 57 y.o. year old female who sees Hoy Register, MD for primary care. I reached out to [name] by phone today to offer care coordination services.  [name] was given information about Care Coordination services today including:   The Care Coordination services include support from the care team which includes your Nurse Coordinator, Clinical Social Worker, or Pharmacist.  The Care Coordination team is here to help remove barriers to the health concerns and goals most important to you. Care Coordination services are voluntary, and the patient may decline or stop services at any time by request to their care team member.   Care Coordination Consent Status: Patient agreed to services and verbal consent obtained.   Follow up plan:  Telephone appointment with care coordination team member scheduled for:  12/24/22  Encounter Outcome:  Pt. Scheduled  [facility] Coordination Care Guide  Direct Dial: [phone]

## 2022-12-24 ENCOUNTER — Telehealth: Payer: Self-pay

## 2022-12-24 ENCOUNTER — Ambulatory Visit: Payer: Self-pay | Admitting: Licensed Clinical Social Worker

## 2022-12-24 NOTE — Transitions of Care (Post Inpatient/ED Visit) (Signed)
12/24/2022  Name: Karen Whitehead MRN: [id number] DOB: 10-18-1965  Today's TOC FU Call Status: Today's TOC FU Call Status:: Successful TOC FU Call Completed TOC FU Call Complete Date: 12/24/22  Red on EMMI-ED Discharge Alert Date & Reason:12/19/22 "Scheduled follow-up appt? No"   Transition Care Management Follow-up Telephone Call Date of Discharge: 12/17/22 Discharge Facility: [facility]) Type of Discharge: Emergency Department Reason for ED Visit: Other: ("N&V,unspecified type") How have you been since you were released from the hospital?: Better (Pt reports she "feels a little better today-not as weak-has not vomited in last two days"-appetite remains decreased-drinking fluids to stay hydrated along with soup-having no coughing or fevers-gets "shortwinded with overexertion-relieved with rest.) Any questions or concerns?: No  Items Reviewed: Did you receive and understand the discharge instructions provided?: Yes Medications obtained,verified, and reconciled?: Partial Review Completed Reason for Partial Mediation Review: pt did not feel up to med review Any new allergies since your discharge?: No Dietary orders reviewed?: Yes Type of Diet Ordered:: low salt/heart healthy Do you have support at home?: Yes People in Home: alone  Medications Reviewed Today: Medications Reviewed Today     Reviewed by Charlyn Minerva, RN (Registered Nurse) on 12/24/22 at 1141  Med List Status: <None>   Medication Order Taking? Sig Documenting Provider Last Dose Status Informant  albuterol (VENTOLIN HFA) 108 (90 Base) MCG/ACT inhaler [id number]  INHALE 1-2 PUFFS INTO THE LUNGS EVERY 6 (SIX) HOURS AS NEEDED FOR WHEEZING OR SHORTNESS OF BREATH.  Patient taking differently: Inhale 2 puffs into the lungs every 6 (six) hours as needed for wheezing or shortness of breath.   Hoy Register, MD  Expired 01/31/22 2359 Self  amitriptyline (ELAVIL) 10 MG tablet [id number] No Take 1 tablet (10 mg  total) by mouth at bedtime.  Patient not taking: Reported on 12/23/2022   Horton Chin, MD Not Taking Active   amLODipine (NORVASC) 5 MG tablet [id number]  Take 1 tablet (5 mg total) by mouth daily.  Patient not taking: Reported on 12/23/2022   Hoy Register, MD  Active   amoxicillin (AMOXIL) 500 MG capsule [id number]  Take 500 mg by mouth daily.  Patient not taking: Reported on 11/24/2022   [provider]  Active   atorvastatin (LIPITOR) 40 MG tablet [id number] Yes Take 1 tablet (40 mg total) by mouth daily. discontinue 20mg  Hoy Register, MD Taking Active   Blood Glucose Monitoring Suppl (TRUE METRIX METER) w/Device KIT [id number] Yes 1 each by Does not apply route in the morning and at bedtime. Anders Simmonds, PA-C Taking Active Self  clonazePAM (KLONOPIN) 0.5 MG tablet [id number] No Take 1 tablet (0.5 mg total) by mouth 2 (two) times daily as needed. for anxiety  Patient not taking: Reported on 12/24/2022   Shanna Cisco, NP Not Taking Active   cyclobenzaprine (FLEXERIL) 10 MG tablet [id number]  Take 1 tablet (10 mg total) by mouth 2 (two) times daily as needed for muscle spasms as needed for muscle pain Hoy Register, MD  Active Self  divalproex (DEPAKOTE ER) 500 MG 24 hr tablet [id number]  Take 1 tablet (500 mg total) by mouth at bedtime. Toy Cookey E, NP  Active   fluticasone (CUTIVATE) 0.005 % ointment [id number]  Apply topically 2 times daily. Hoy Register, MD  Active   gabapentin (NEURONTIN) 300 MG capsule [id number] No Take 1 capsule (300 mg total) by mouth 3 (three) times daily.  Patient not taking: Reported on 12/24/2022   Horton Chin, MD  Not Taking Active   glucose blood (TRUE METRIX BLOOD GLUCOSE TEST) test strip [id number]  Use as instructed Hoy Register, MD  Active Self  HYDROcodone-acetaminophen (NORCO/VICODIN) 5-325 MG tablet [id number] Yes Take 1 tablet by mouth 4 (four) times daily as needed for moderate pain. Jones Bales, NP Taking  Active   ibuprofen (ADVIL) 200 MG tablet [id number]  Take 200 mg by mouth every 6 (six) hours as needed for headache, mild pain or moderate pain. [provider]  Active Self  lidocaine (LIDODERM) 5 % [id number]  Place 2 patches onto the skin daily. Remove & Discard patch within 12 hours or as directed by MD Raulkar, Drema Pry, MD  Active   lidocaine (XYLOCAINE) 1 % (with pres) injection 3 mL [id number]   Raulkar, Drema Pry, MD  Active   lidocaine (XYLOCAINE) 1 % (with pres) injection 5 mL [id number]   Raulkar, Drema Pry, MD  Active   ondansetron (ZOFRAN-ODT) 4 MG disintegrating tablet [id number] Yes Take 1 tablet (4 mg total) by mouth every 8 (eight) hours as needed for nausea or vomiting. Terrilee Files, MD Taking Active    Patient not taking:   Discontinued 11/10/18 1242   risperiDONE (RISPERDAL) 1 MG tablet [id number]  Take 1 tablet (1 mg total) by mouth 2 (two) times daily. Shanna Cisco, NP  Active   Semaglutide, 1 MG/DOSE, 4 MG/3ML SOPN [id number]  Inject 1 mg as directed once a week. Hoy Register, MD  Active   sertraline (ZOLOFT) 100 MG tablet [id number]  TAKE 2 TABLETS BY MOUTH ONCE A DAY Toy Cookey E, NP  Active   sodium chloride (PF) 0.9 % injection 2 mL [id number]   Raulkar, Drema Pry, MD  Active   SUMAtriptan (IMITREX) 50 MG tablet [id number]  Take 1 tablet (50 mg total) by mouth every 2 (two) hours as needed for migraine. May repeat in 2 hours if headache persists or recurs. Levert Feinstein, MD  Active Self  topiramate (TOPAMAX) 50 MG tablet [id number]  Take 1 tablet (50 mg total) by mouth 2 (two) times daily. Horton Chin, MD  Active Self           Med Note Gabriel Earing Mar 18, 2021  7:00 PM) Pt needs a refill   traZODone (DESYREL) 100 MG tablet [id number]  TAKE 2 TABLETS BY MOUTH AT BEDTIME Toy Cookey E, NP  Active   TRUEplus Lancets 28G MISC [id number]  Use as directed in the morning and at bedtime. Anders Simmonds, PA-C  Active Self  Vitamin D,  Ergocalciferol, (DRISDOL) 1.25 MG (50000 UNIT) CAPS capsule [id number]  Take 1 capsule (50,000 Units total) by mouth every 7 (seven) days. Horton Chin, MD  Active             Home Care and Equipment/Supplies: Were Home Health Services Ordered?: NA Any new equipment or medical supplies ordered?: NA  Functional Questionnaire: Do you need assistance with bathing/showering or dressing?: No Do you need assistance with meal preparation?: No Do you need assistance with eating?: No Do you have difficulty maintaining continence: No Do you need assistance with getting out of bed/getting out of a chair/moving?: No Do you have difficulty managing or taking your medications?: No  Follow up appointments reviewed: PCP Follow-up appointment confirmed?: Yes (Pt requested virtual visit-due to COVID & transportation reasons and only wanted to see regular MD-not anyone else in office-care guide scheduled appt-pt aware to contact office if sxs worsen and/or unresolved  for sooner appt) Date of PCP follow-up appointment?: 01/26/23 Follow-up Provider: Dr. Alvis Lemmings Do you need transportation to your follow-up appointment?: Yes (Pt spoke with SW earlier this morning and was given transportation resources for her to follow up on) Do you understand care options if your condition(s) worsen?: Yes-patient verbalized understanding  SDOH Interventions Today    Flowsheet Row Most Recent Value  SDOH Interventions   Food Insecurity Interventions Intervention Not Indicated  Transportation Interventions Intervention Not Indicated  [Pt spoke with SW this morning and was given transportation resources to follow up on]      TOC Interventions Today    Flowsheet Row Most Recent Value  TOC Interventions   TOC Interventions Discussed/Reviewed TOC Interventions Discussed      Interventions Today    Flowsheet Row Most Recent Value  Chronic Disease   Chronic disease during today's visit Diabetes  General  Interventions   General Interventions Discussed/Reviewed General Interventions Discussed, Doctor Visits, Vaccines, Durable Medical Equipment (DME)  Vaccines COVID-19  Doctor Visits Discussed/Reviewed Doctor Visits Discussed, PCP, Specialist  Durable Medical Equipment (DME) Glucomoter  [pt has not been checking cbgs while she has been sick-encouraged to montior cbgs]  PCP/Specialist Visits Compliance with follow-up visit  Education Interventions   Education Provided Provided Education  Provided Verbal Education On Nutrition, Blood Sugar Monitoring, When to see the doctor, Medication, Other  [sx mgmt]  Nutrition Interventions   Nutrition Discussed/Reviewed Nutrition Discussed, Fluid intake  Pharmacy Interventions   Pharmacy Dicussed/Reviewed Pharmacy Topics Discussed, Medications and their functions  Safety Interventions   Safety Discussed/Reviewed Safety Discussed       Alessandra Grout [facility] Care Management Care Management Community Coordinator Direct Phone: [phone] Toll Free: [phone] Fax: [phone]

## 2022-12-24 NOTE — Patient Instructions (Signed)
Visit Information  Thank you for taking time to visit with me today. Please don't hesitate to contact me if I can be of assistance to you.   Following are the goals we discussed today:   Goals Addressed             This Visit's Progress    Patient Stated she gets sad sometimes related to managing medical needs       Intervention:  Spoke with client about client needs Spoke with client about program support with RN, LCSW, Pharmacist Spoke with client about anxiety issues, about occasional panic attacks. She said she did see counselor in the past but is not seeing a counselor at present.  She could not say what triggers a panic attack Provided counseling support for client Client said she gets some anxiety in crowds.  Discussed sleeping issues of client. She has some difficulty sleeping Discussed medication procurement Discussed coping skills of client. She said she takes Trazodone periodically to help her sleep Client likes to relax by meditating, likes to watch TV, used to like to walk (must be accompanied by someone) Discussed role of LCSW and discussed Care coordination program support Discussed family support. Her son is supportive (helps with obtaining groceries, clean home, transportation) Discussed transport needs of client. She is not on Medicaid at present. She has Medicare A and B.   Discussed [employer] support. Discussed application completion Discussed pain issues.  She has back pain issues Discussed ambulation of client. She uses a walker or a cane to help her walk Thanked client for phone call Encouraged client to call LCSW as needed at [phone].          Our next appointment is by telephone on 01/14/23 at 9:30 AM   Please call the care guide team at [phone] if you need to cancel or reschedule your appointment.   If you are experiencing a Mental Health or Behavioral Health Crisis or need someone to talk to, please go to [facility] [address] [place] [phone])   The patient verbalized understanding of instructions, educational materials, and care plan provided today and DECLINED offer to receive copy of patient instructions, educational materials, and care plan.   The patient has been provided with contact information for the care management team and has been advised to call with any health related questions or concerns.   Kelton Pillar.Tamee Battin MSW, LCSW Licensed Visual merchandiser [facility] Care Management [phone]

## 2022-12-24 NOTE — Patient Outreach (Signed)
Care Coordination   Initial Visit Note   12/24/2022 Name: Karen Whitehead MRN: [id number] DOB: Dec 14, 1965  Karen Whitehead is a 57 y.o. year old female who sees Hoy Register, MD for primary care. I spoke with  Karen Whitehead by phone today.  What matters to the patients health and wellness today?  Patient stated she gets sad sometimes  related to managing medical care needs.    Goals Addressed             This Visit's Progress    Patient Stated she gets sad sometimes related to managing medical needs       Intervention:  Spoke with client about client needs Spoke with client about program support with RN, LCSW, Pharmacist Spoke with client about anxiety issues, about occasional panic attacks. She said she did see counselor in the past but is not seeing a counselor at present.  She could not say what triggers a panic attack Provided counseling support for client Client said she gets some anxiety in crowds.  Discussed sleeping issues of client. She has some difficulty sleeping Discussed medication procurement Discussed coping skills of client. She said she takes Trazodone periodically to help her sleep Client likes to relax by meditating, likes to watch TV, used to like to walk (must be accompanied by someone) Discussed role of LCSW and discussed Care coordination program support Discussed family support. Her son is supportive (helps with obtaining groceries, clean home, transportation) Discussed transport needs of client. She is not on Medicaid at present. She has Medicare A and B.   Discussed GTA support. Discussed application completion Discussed pain issues.  She has back pain issues Discussed ambulation of client. She uses a walker or a cane to help her walk Client said when she is feeling better she will call counseling center where she previously received counseling to see if she can set up appointment for client Thanked client for phone call Encouraged client to call LCSW  as needed at [phone].          SDOH assessments and interventions completed:  Yes  SDOH Interventions Today    Flowsheet Row Most Recent Value  SDOH Interventions   Depression Interventions/Treatment  Counseling  Physical Activity Interventions Other (Comments)  [uses walker or a cane to help her walk]  Stress Interventions Provide Counseling  [has stress in managing medical needs]        Care Coordination Interventions:  Yes, provided   Interventions Today    Flowsheet Row Most Recent Value  Chronic Disease   Chronic disease during today's visit Other  [talked with client about client needs]  General Interventions   General Interventions Discussed/Reviewed General Interventions Discussed, [employer] program support with RN, LCSW, ,Pharmacist]  Exercise Interventions   Exercise Discussed/Reviewed Physical Activity  [uses a walker to help her walk]  Physical Activity Discussed/Reviewed Physical Activity Reviewed  Education Interventions   Education Provided Provided Education  Provided Verbal Education On Community Resources  Mental Health Interventions   Mental Health Discussed/Reviewed Coping Strategies  [discussed mood of client. she is sad sometimes related to managing medical needs]  Nutrition Interventions   Nutrition Discussed/Reviewed Nutrition Discussed  Pharmacy Interventions   Pharmacy Dicussed/Reviewed Pharmacy Topics Discussed       Follow up plan: Follow up call scheduled for 01/14/23 at 9:30 AM    Encounter Outcome:  Pt. Visit Completed   Kelton Pillar.Koriana Stepien MSW, LCSW Licensed Visual merchandiser [facility] Care Management [phone]

## 2023-01-06 ENCOUNTER — Encounter: Payer: No Typology Code available for payment source | Admitting: Physical Medicine and Rehabilitation

## 2023-01-14 ENCOUNTER — Ambulatory Visit: Payer: Self-pay | Admitting: Licensed Clinical Social Worker

## 2023-01-14 NOTE — Patient Instructions (Signed)
Visit Information  Thank you for taking time to visit with me today. Please don't hesitate to contact me if I can be of assistance to you.   Following are the goals we discussed today:   Goals Addressed             This Visit's Progress    Patient Stated she gets sad sometimes related to managing medical needs       Intervention:  Spoke with client via phone today about her status and current needs face Spoke with client about program support with RN, LCSW, Pharmacist Spoke with client about anxiety issues, about occasional panic attacks. LCSW and client discussed client use of relaxation techniques such as meditation, deep breathing, muscle tensing and relaxation, taking a nap.  She said she is planning to go to mental health agency she had gone to before for mental health support for client. She is working on transport arrangements to go to and from that particular mental health agency Provided counseling support for client Discussed sleeping issues of client. She has some difficulty sleeping. She said sometimes she only sleeps a few hours  at a time at night. Discussed medication procurement Discussed family support. Her son is supportive (helps with obtaining groceries, clean home, transportation). Her sister is also supportive of client Discussed client insurance coverage.  She is not on Medicaid at present. She has Medicare A and B.   [place]ed [place]. Discussed application completion. She said with her walking challenges, [place] Discussed pain issues.  She has back pain issues Discussed ambulation of client. She uses a walker or a cane to help her walk Thanked client for phone call Encouraged client to call LCSW as needed at [phone].           Our next appointment is by telephone on 03/04/23 at 9:30 AM   Please call the care guide team at [phone] if you need to cancel or reschedule your appointment.   If you  are experiencing a Mental Health or Behavioral Health Crisis or need someone to talk to, please go to [facility] [address], [place] [phone])   The patient verbalized understanding of instructions, educational materials, and care plan provided today and DECLINED offer to receive copy of patient instructions, educational materials, and care plan.   The patient has been provided with contact information for the care management team and has been advised to call with any health related questions or concerns.   Kelton Pillar.Miasia Crabtree MSW, LCSW Licensed Visual merchandiser [facility] Care Management [phone]

## 2023-01-14 NOTE — Patient Outreach (Signed)
Care Coordination   Follow Up Visit Note   01/14/2023 Name: Karen Whitehead MRN: [id number] DOB: Sep 03, 1965  Karen Whitehead is a 57 y.o. year old female who sees Hoy Register, MD for primary care. I spoke with  Karen Whitehead by phone today.  What matters to the patients health and wellness today?  Patient stated she gets sad sometimes related   to managing medical needs.     Goals Addressed             This Visit's Progress    Patient Stated she gets sad sometimes related to managing medical needs       Intervention:  Spoke with client via phone today about her status and current needs face Spoke with client about program support with RN, LCSW, Pharmacist Spoke with client about anxiety issues, about occasional panic attacks. LCSW and client discussed client use of relaxation techniques such as meditation, deep breathing, muscle tensing and relaxation, taking a nap.  She said she is planning to go to mental health agency she had gone to before for mental health support for client. She is working on transport arrangements to go to and from that particular mental health agency Provided counseling support for client Discussed sleeping issues of client. She has some difficulty sleeping. She said sometimes she only sleeps a few hours  at a time at night. Discussed medication procurement Discussed family support. Her son is supportive (helps with obtaining groceries, clean home, transportation). Her sister is also supportive of client Discussed client insurance coverage.  She is not on Medicaid at present. She has Medicare A and B.   Discussed [place]. Discussed application completion. She said with her walking challenges, it [place] Discussed pain issues.  She has back pain issues Discussed ambulation of client. She uses a walker or a cane to help her walk Thanked client for phone call Encouraged client to call LCSW as needed at  [phone].           SDOH assessments and interventions completed:  Yes  SDOH Interventions Today    Flowsheet Row Most Recent Value  SDOH Interventions   Depression Interventions/Treatment  Counseling  Physical Activity Interventions Other (Comments)  [uses a walker or a cane as needed to help her walk]  Stress Interventions Provide Counseling  [has stress related to finances,  has stress in managing medical needs]        Care Coordination Interventions:  Yes, provided    Interventions Today    Flowsheet Row Most Recent Value  Chronic Disease   Chronic disease during today's visit Other  [spoke with client about client needs]  General Interventions   General Interventions Discussed/Reviewed General Interventions Discussed, Community Resources  Exercise Interventions   Exercise Discussed/Reviewed Physical Activity  [client uses a walker or a cane as needed to help her walk]  Physical Activity Discussed/Reviewed Physical Activity Discussed  Education Interventions   Education Provided Provided Education  Provided Verbal Education On Community Resources  Mental Health Interventions   Mental Health Discussed/Reviewed Coping Strategies  [client has panic episodes. she is anxious occasionally . she has some difficultly sleeping.  She plans to visit mental health center soon to talk witih agency about her mental health needs. She has been to this mental health agency previously]  Pharmacy Interventions   Pharmacy Dicussed/Reviewed Pharmacy Topics Discussed       Follow up plan: Follow up call  scheduled for 03/04/23 at 9:30 AM    Encounter Outcome:  Patient Visit Completed   Kelton Pillar.Heli Dino MSW, LCSW Licensed Visual merchandiser [facility] Care Management [phone]

## 2023-01-15 ENCOUNTER — Encounter
Payer: No Typology Code available for payment source | Attending: Physical Medicine and Rehabilitation | Admitting: Physical Medicine and Rehabilitation

## 2023-01-15 ENCOUNTER — Other Ambulatory Visit (HOSPITAL_COMMUNITY): Payer: Self-pay

## 2023-01-15 ENCOUNTER — Other Ambulatory Visit: Payer: Self-pay

## 2023-01-15 VITALS — BP 136/91 | HR 82 | Ht 64.0 in | Wt 207.0 lb

## 2023-01-15 DIAGNOSIS — M7918 Myalgia, other site: Secondary | ICD-10-CM | POA: Insufficient documentation

## 2023-01-15 MED ORDER — LIDOCAINE HCL 1 % IJ SOLN
3.0000 mL | Freq: Once | INTRAMUSCULAR | Status: AC
Start: 2023-01-15 — End: 2023-01-15
  Administered 2023-01-15: 3 mL

## 2023-01-15 MED ORDER — HYDROCODONE-ACETAMINOPHEN 5-325 MG PO TABS
1.0000 | ORAL_TABLET | Freq: Four times a day (QID) | ORAL | 0 refills | Status: DC | PRN
Start: 2023-01-15 — End: 2023-02-17
  Filled 2023-01-15 – 2023-01-21 (×4): qty 120, 30d supply, fill #0

## 2023-01-15 MED ORDER — SODIUM CHLORIDE (PF) 0.9 % IJ SOLN
2.0000 mL | Freq: Once | INTRAMUSCULAR | Status: AC
Start: 2023-01-15 — End: 2023-01-15
  Administered 2023-01-15: 2 mL via INTRAVENOUS

## 2023-01-15 NOTE — Progress Notes (Signed)
Trigger Point Injection  Indication: lumbar myofascial pain not relieved by medication management and other conservative care.  Informed consent was obtained after describing risk and benefits of the procedure with the patient, this includes bleeding, bruising, infection and medication side effects.  The patient wishes to proceed and has given written consent.  The patient was placed in a seated position.  The area of pain was marked and prepped with Betadine.  It was entered with a 25-gauge 1/2 inch needle and a total of 5 mL of 1% lidocaine and normal saline was injected into a total of 4 trigger points, after negative draw back for blood.  The patient tolerated the procedure well.  Post procedure instructions were given.

## 2023-01-16 ENCOUNTER — Other Ambulatory Visit (HOSPITAL_COMMUNITY): Payer: Self-pay

## 2023-01-21 ENCOUNTER — Other Ambulatory Visit: Payer: Self-pay

## 2023-01-21 ENCOUNTER — Other Ambulatory Visit: Payer: Self-pay | Admitting: Family Medicine

## 2023-01-21 ENCOUNTER — Other Ambulatory Visit (HOSPITAL_COMMUNITY): Payer: Self-pay

## 2023-01-21 DIAGNOSIS — F411 Generalized anxiety disorder: Secondary | ICD-10-CM

## 2023-01-21 DIAGNOSIS — F3181 Bipolar II disorder: Secondary | ICD-10-CM

## 2023-01-22 MED ORDER — TRAZODONE HCL 100 MG PO TABS
200.0000 mg | ORAL_TABLET | Freq: Every day | ORAL | 3 refills | Status: DC
  Filled 2023-01-22: qty 60, 30d supply, fill #0
  Filled 2023-03-07: qty 60, 30d supply, fill #1
  Filled 2023-06-20: qty 60, 30d supply, fill #2
  Filled 2023-10-19: qty 60, 30d supply, fill #3

## 2023-01-22 NOTE — Telephone Encounter (Signed)
Requested medication (s) are due for refill today - provider review   Requested medication (s) are on the active medication list -yes  Future visit scheduled -yes  Last refill: 10/31/21 #60 3RF  Notes to clinic: outside provider  Requested Prescriptions  Pending Prescriptions Disp Refills   traZODone (DESYREL) 100 MG tablet 60 tablet 3    Sig: TAKE 2 TABLETS BY MOUTH AT BEDTIME     Psychiatry: Antidepressants - Serotonin Modulator Passed - 01/22/2023  3:20 PM      Passed - Completed PHQ-2 or PHQ-9 in the last 360 days      Passed - Valid encounter within last 6 months    Recent Outpatient Visits           1 month ago Type 2 diabetes mellitus with hyperglycemia, without long-term current use of insulin (HCC)   [facility] [place] [facility] Hoy Register, MD   7 months ago Annual visit for general adult medical examination with abnormal findings   [facility] [place], Odette Horns, MD   8 months ago Type 2 diabetes mellitus with hyperglycemia, without long-term current use of insulin (HCC)   [facility] [place] [facility] [place], [place], MD   1 year ago Type 2 diabetes mellitus with hyperglycemia, without long-term current use of insulin (HCC)   [facility] Hoy Register, MD   2 years ago Annual physical exam   [place] [facility] Hoy [facility], MD       Future Appointments             In 4 months Hoy Register, MD [facility]               Requested Prescriptions  Pending Prescriptions Disp Refills   traZODone (DESYREL) 100 MG tablet 60 tablet 3    Sig: TAKE 2 TABLETS BY MOUTH AT BEDTIME     Psychiatry: Antidepressants - Serotonin Modulator Passed - 01/22/2023  3:20 PM      Passed - Completed PHQ-2 or PHQ-9 in the last 360 days      Passed - Valid encounter within last 6 months    Recent  Outpatient Visits           1 month ago Type 2 diabetes mellitus with hyperglycemia, without long-term current use of insulin (HCC)   [facility] Hoy Register, MD   7 months ago Annual visit for general adult medical examination with abnormal findings   [place] [facility] [place], [place], MD   8 months ago Type 2 diabetes mellitus with hyperglycemia, without long-term current use of insulin [facility])   [place] [facility] [place], [place], MD   1 year ago Type 2 diabetes mellitus with hyperglycemia, without long-term current use of insulin [facility])   [place] [facility] Hoy Register, MD   2 years ago Annual physical exam   [place] [facility] Hoy Register, MD       Future Appointments             In 4 months Hoy Register, MD [facility]

## 2023-01-23 ENCOUNTER — Other Ambulatory Visit: Payer: Self-pay

## 2023-02-12 DIAGNOSIS — H524 Presbyopia: Secondary | ICD-10-CM | POA: Diagnosis not present

## 2023-02-12 DIAGNOSIS — H5213 Myopia, bilateral: Secondary | ICD-10-CM | POA: Diagnosis not present

## 2023-02-12 DIAGNOSIS — E119 Type 2 diabetes mellitus without complications: Secondary | ICD-10-CM | POA: Diagnosis not present

## 2023-02-12 DIAGNOSIS — H52223 Regular astigmatism, bilateral: Secondary | ICD-10-CM | POA: Diagnosis not present

## 2023-02-12 DIAGNOSIS — R6889 Other general symptoms and signs: Secondary | ICD-10-CM | POA: Diagnosis not present

## 2023-02-17 ENCOUNTER — Other Ambulatory Visit: Payer: Self-pay

## 2023-02-17 ENCOUNTER — Encounter: Payer: Self-pay | Admitting: Physical Medicine and Rehabilitation

## 2023-02-17 ENCOUNTER — Other Ambulatory Visit (HOSPITAL_COMMUNITY): Payer: Self-pay

## 2023-02-17 ENCOUNTER — Encounter: Payer: Medicare HMO | Attending: Physical Medicine and Rehabilitation | Admitting: Physical Medicine and Rehabilitation

## 2023-02-17 VITALS — BP 127/80 | HR 81 | Ht 64.0 in | Wt 207.0 lb

## 2023-02-17 DIAGNOSIS — M7918 Myalgia, other site: Secondary | ICD-10-CM | POA: Insufficient documentation

## 2023-02-17 DIAGNOSIS — M48062 Spinal stenosis, lumbar region with neurogenic claudication: Secondary | ICD-10-CM | POA: Diagnosis not present

## 2023-02-17 DIAGNOSIS — R6889 Other general symptoms and signs: Secondary | ICD-10-CM | POA: Diagnosis not present

## 2023-02-17 MED ORDER — OXYCODONE-ACETAMINOPHEN 7.5-325 MG PO TABS
1.0000 | ORAL_TABLET | ORAL | 0 refills | Status: DC | PRN
Start: 2023-02-17 — End: 2023-03-12
  Filled 2023-02-17 (×2): qty 180, 30d supply, fill #0

## 2023-02-17 MED ORDER — LIDOCAINE HCL 1 % IJ SOLN
3.0000 mL | Freq: Once | INTRAMUSCULAR | Status: AC
Start: 2023-02-17 — End: 2023-02-17
  Administered 2023-02-17: 3 mL

## 2023-02-17 NOTE — Progress Notes (Signed)

## 2023-02-19 ENCOUNTER — Other Ambulatory Visit: Payer: Self-pay

## 2023-02-19 ENCOUNTER — Other Ambulatory Visit (HOSPITAL_COMMUNITY): Payer: Self-pay

## 2023-02-23 ENCOUNTER — Ambulatory Visit (HOSPITAL_COMMUNITY)
Admission: RE | Admit: 2023-02-23 | Discharge: 2023-02-23 | Disposition: A | Discharge status: Home / Self Care | Payer: Medicare HMO | Source: Ambulatory Visit | Attending: Physical Medicine and Rehabilitation | Admitting: Physical Medicine and Rehabilitation

## 2023-02-23 DIAGNOSIS — M48061 Spinal stenosis, lumbar region without neurogenic claudication: Secondary | ICD-10-CM | POA: Diagnosis not present

## 2023-02-23 DIAGNOSIS — M48062 Spinal stenosis, lumbar region with neurogenic claudication: Secondary | ICD-10-CM | POA: Diagnosis not present

## 2023-02-23 DIAGNOSIS — M5416 Radiculopathy, lumbar region: Secondary | ICD-10-CM | POA: Diagnosis not present

## 2023-02-23 DIAGNOSIS — R6889 Other general symptoms and signs: Secondary | ICD-10-CM | POA: Diagnosis not present

## 2023-02-23 DIAGNOSIS — M4316 Spondylolisthesis, lumbar region: Secondary | ICD-10-CM | POA: Diagnosis not present

## 2023-02-23 DIAGNOSIS — M545 Low back pain, unspecified: Secondary | ICD-10-CM | POA: Diagnosis not present

## 2023-02-27 ENCOUNTER — Other Ambulatory Visit: Payer: Self-pay

## 2023-02-27 ENCOUNTER — Other Ambulatory Visit: Payer: Self-pay | Admitting: Physician Assistant

## 2023-02-27 DIAGNOSIS — E1165 Type 2 diabetes mellitus with hyperglycemia: Secondary | ICD-10-CM

## 2023-02-27 MED ORDER — BLOOD GLUCOSE MONITOR SYSTEM W/DEVICE KIT
1.0000 | PACK | Freq: Two times a day (BID) | 0 refills | Status: DC
  Filled 2023-02-27: qty 1, 30d supply, fill #0

## 2023-03-02 ENCOUNTER — Other Ambulatory Visit: Payer: Self-pay | Admitting: Physical Medicine and Rehabilitation

## 2023-03-02 DIAGNOSIS — G894 Chronic pain syndrome: Secondary | ICD-10-CM

## 2023-03-02 MED ORDER — LIDOCAINE 5 % EX PTCH
2.0000 | MEDICATED_PATCH | CUTANEOUS | 11 refills | Status: AC
  Filled 2023-03-02: qty 60, 30d supply, fill #0

## 2023-03-03 ENCOUNTER — Encounter (HOSPITAL_COMMUNITY): Payer: Self-pay

## 2023-03-03 ENCOUNTER — Other Ambulatory Visit: Payer: Self-pay

## 2023-03-04 ENCOUNTER — Other Ambulatory Visit: Payer: Self-pay | Admitting: Family Medicine

## 2023-03-04 ENCOUNTER — Other Ambulatory Visit: Payer: Self-pay

## 2023-03-04 ENCOUNTER — Other Ambulatory Visit (HOSPITAL_COMMUNITY): Payer: Self-pay

## 2023-03-04 ENCOUNTER — Ambulatory Visit: Payer: Self-pay | Admitting: Licensed Clinical Social Worker

## 2023-03-04 ENCOUNTER — Telehealth: Payer: Self-pay

## 2023-03-04 DIAGNOSIS — E1165 Type 2 diabetes mellitus with hyperglycemia: Secondary | ICD-10-CM

## 2023-03-04 MED ORDER — ONETOUCH VERIO VI STRP
ORAL_STRIP | 6 refills | Status: DC
  Filled 2023-03-04: qty 100, 50d supply, fill #0

## 2023-03-04 MED ORDER — ONETOUCH DELICA PLUS LANCET33G MISC
6 refills | Status: DC
  Filled 2023-03-04: qty 100, 50d supply, fill #0
  Filled 2023-10-19: qty 100, 50d supply, fill #1

## 2023-03-04 MED ORDER — ONETOUCH VERIO W/DEVICE KIT
PACK | 0 refills | Status: DC
  Filled 2023-03-04: qty 1, 30d supply, fill #0

## 2023-03-04 NOTE — Telephone Encounter (Signed)
Outcome Lidocaine patches Approved today by [facility] NCPDP 2017 [place] Case: [id number], Status: Approved, Coverage Starts on: 05/05/2022 12:00:00 AM, Coverage Ends on: 05/04/2024 12:00:00 AM. Questions? Contact [phone]. Authorization Expiration Date: 05/03/2024

## 2023-03-04 NOTE — Patient Outreach (Signed)
Care Coordination   03/04/2023 Name: Karen Whitehead MRN: [id number] DOB: 1965/12/26   Care Coordination Outreach Attempts:  An unsuccessful telephone outreach was attempted today to offer the patient information about available care coordination services.  Follow Up Plan:  Additional outreach attempts will be made to offer the patient care coordination information and services.   Encounter Outcome:  No Answer   Care Coordination Interventions:  No, not indicated    Kelton Pillar.Shedric Fredericks MSW, LCSW Licensed Visual merchandiser [facility] Care Management [phone]

## 2023-03-05 ENCOUNTER — Other Ambulatory Visit (HOSPITAL_COMMUNITY): Payer: Self-pay

## 2023-03-07 ENCOUNTER — Other Ambulatory Visit (HOSPITAL_COMMUNITY): Payer: Self-pay

## 2023-03-12 ENCOUNTER — Encounter: Payer: Medicare HMO | Attending: Physical Medicine and Rehabilitation | Admitting: Physical Medicine and Rehabilitation

## 2023-03-12 ENCOUNTER — Other Ambulatory Visit: Payer: Self-pay

## 2023-03-12 ENCOUNTER — Encounter: Payer: Self-pay | Admitting: Physical Medicine and Rehabilitation

## 2023-03-12 VITALS — BP 136/89 | HR 82 | Ht 64.0 in | Wt 199.0 lb

## 2023-03-12 DIAGNOSIS — R6889 Other general symptoms and signs: Secondary | ICD-10-CM | POA: Diagnosis not present

## 2023-03-12 DIAGNOSIS — G894 Chronic pain syndrome: Secondary | ICD-10-CM | POA: Diagnosis not present

## 2023-03-12 DIAGNOSIS — R202 Paresthesia of skin: Secondary | ICD-10-CM | POA: Diagnosis not present

## 2023-03-12 DIAGNOSIS — G629 Polyneuropathy, unspecified: Secondary | ICD-10-CM | POA: Diagnosis not present

## 2023-03-12 DIAGNOSIS — M7918 Myalgia, other site: Secondary | ICD-10-CM | POA: Diagnosis not present

## 2023-03-12 DIAGNOSIS — R200 Anesthesia of skin: Secondary | ICD-10-CM | POA: Diagnosis not present

## 2023-03-12 MED ORDER — LIDOCAINE HCL 1 % IJ SOLN
3.0000 mL | Freq: Once | INTRAMUSCULAR | Status: AC
Start: 2023-03-12 — End: 2023-03-12
  Administered 2023-03-12: 3 mL

## 2023-03-12 MED ORDER — SODIUM CHLORIDE (PF) 0.9 % IJ SOLN
2.0000 mL | INTRAMUSCULAR | Status: AC | PRN
Start: 2023-03-12 — End: ?

## 2023-03-12 MED ORDER — OXYCODONE-ACETAMINOPHEN 7.5-325 MG PO TABS
1.0000 | ORAL_TABLET | ORAL | 0 refills | Status: DC | PRN
  Filled 2023-03-12 – 2023-03-20 (×2): qty 180, 30d supply, fill #0

## 2023-03-12 NOTE — Progress Notes (Signed)
Subjective:    Patient ID: Karen Whitehead, female    DOB: 1965/12/10, 57 y.o.   MRN: [id number]  HPI .  Karen Whitehead is a 57 year old woman who presents for f/u of myalgias post-dengue fever, and chronic lumbar and thoracic pain, insomnia, and anxiety.  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  -she gets lots of headaches -reviewed her hip and knee XRs with her: negative -she would like to get XRs of her spine as well.  -her hands and muscles throb so baldy that she cannot fall asleep at night -she takes one muscle relaxer in the morning -she used to be very active and takes girls trips to the beach.  -in tears today when she heard we had to reschedule her appointment for tomorrow because she was hoping to at least get some relief from the trigger point injections -she can barely get out of bed without assistance due to the severity of pain in the muscles of her back -she does not want to try any more muscle relaxers as these do not help enough -she has never tried tylenol with codeine.  -she does not want to increase the amitriptyline further   2) Insomnia: -she currently takes trazodone and sertraline.  -she has not found relief with the amitriptyline 10mg . She would like to try this again -her pain keeps her awake -she found amitriptyline helpful  3) memory loss -has not worked with SLP before -does not follow with neurology -discussed results of her MRI brain with her- shows degeneration atypical for her age but no acute processes.   4) Rash- has to wear everything shortsleeve  5) Lumbar DDD: -she cannot handle the pain -pain is most severe in right lower   -she finds great relief from the hydrocodone- it helps her to function better -muscle relaxers don't help -she loves aquatic therapy- the take their time and are trying to build her muscles -she has an EMG/NCS scheduled -she wants a medicine to help her get through the pain today.  -she has tried ibuprofen and was was told not to because of her kidneys -had ESI and could not walk after -still has pain in her lower back where her pain is the worst -pain does not radiate into the legs/ -the trigger point injections helped for 12-13 days -her son heats rags and lays these on her back  6) Fall: -she fell sideways on her elbow when she could not judge the distance appropriately. -she fell on her knee recently.  -she is going to be seeing an orthopedic doctor  7) Vision goes in and out -sometimes she can see the TV and sometimes she can't   8) Thoracic spine pain -she feels large breasts contribute to this  9) Right groin pain and numbness -does not feel hip pain -does not feel mass  10) Anxiety -she found amitriptyline helpful for her anxiety as well  Diet: her son usually cooks her meals for her.   11) Numbness and tingling -present in right groin and right lateral hip -has associated right sided back pain -MRI lumbar spine results are still pending -not associated with tight pants -  interested in another steroid injection   Pain Inventory Average Pain 9 Pain Right Now 9 My pain is constant, sharp, stabbing, tingling and aching  In the last 24 hours, has pain interfered with the following? General activity 10 Relation with others 10 Enjoyment of life 10 What TIME of day is your pain at its worst? morning , daytime, evening and night Sleep (in general) Poor  Pain is worse with: walking, bending, inactivity and standing Pain improves with: medication, injections Relief from Meds: 3  use a walker how many minutes can you walk? 5 ability to climb steps?  yes do you  drive?  no  not employed: date last employed . I need assistance with the following:  dressing, bathing, household duties and shopping  bladder control problems weakness numbness tingling trouble walking spasms dizziness confusion depression anxiety suicidal thoughts        Family History  Problem Relation Age of Onset   Stomach cancer Mother    Heart attack Father    Breast cancer Neg Hx    Colon cancer Neg Hx    Colon polyps Neg Hx    Social History   Socioeconomic History   Marital status: Single    Spouse name: Not on file   Number of children: 1   Years of education: Not on file   Highest education level: Some college, no degree  Occupational History   Not on file  Tobacco Use   Smoking status: Every Day    Current packs/day: 0.50    Types: Cigarettes   Smokeless tobacco: Never  Vaping Use   Vaping status: Never Used  Substance and Sexual Activity   Alcohol use: No   Drug use: No   Sexual activity: Not Currently  Other Topics Concern   Not on file  Social History Narrative   Not on file   Social Determinants of Health   Financial Resource Strain: Not on file  Food Insecurity: No Food Insecurity (12/24/2022)   Hunger Vital Sign    Worried About Running Out of Food in the Last Year: Never true    Ran Out of Food in the Last Year: Never true  Transportation Needs: Unmet Transportation Needs (12/24/2022)   PRAPARE - Transportation    Lack of Transportation (Medical): Yes    Lack of Transportation (Non-Medical): Yes  Physical Activity: Inactive (01/14/2023)   Exercise Vital Sign    Days of Exercise per Week: 0 days    Minutes of Exercise per Session: 0 min  Stress: Stress Concern Present (01/14/2023)   [employer] of Occupational Health - Occupational Stress Questionnaire    Feeling of Stress : Rather much  Social Connections: Not on file   Past Surgical History:  Procedure Laterality Date   APPENDECTOMY     Past Medical History:   Diagnosis Date   Anxiety    Arthritis    Chronic kidney disease    Depression    Diabetes mellitus without complication (HCC)    Granuloma annulare    Hyperlipidemia    BP 136/89   Pulse 82   Ht 5\' 4"  (1.626 m)   Wt 199 lb (90.3 kg)   SpO2 98%   BMI 34.16 kg/m   Opioid Risk Score:   Fall Risk Score:  `1  Depression screen [facility] 2/9     02/17/2023   10:27 AM 01/14/2023    9:28 AM 12/24/2022    9:42 AM 12/23/2022    1:24 PM 11/26/2022  2:56 PM 11/24/2022    1:44 PM 09/04/2022   10:26 AM  Depression screen PHQ 2/9  Decreased Interest 0 1 1 1 3 1 1   Down, Depressed, Hopeless 0 1 1 1 2 1 1   PHQ - 2 Score 0 2 2 2 5 2 2   Altered sleeping  2 1  3     Tired, decreased energy  1 2  3     Change in appetite  1 1  1     Feeling bad or failure about yourself   1 1  3     Trouble concentrating  1 1  3     Moving slowly or fidgety/restless  1 1  1     Suicidal thoughts  0 0  1    PHQ-9 Score  9 9  20     Difficult doing work/chores  Somewhat difficult Somewhat difficult        Review of Systems  Constitutional:  Positive for appetite change.  Respiratory:  Positive for cough and shortness of breath.   Gastrointestinal:  Positive for constipation.  Musculoskeletal:  Positive for back pain, gait problem and neck pain.       Left hip and knee pain Pain in back of legs  Neurological:  Positive for dizziness, weakness and numbness.       Tongling  Psychiatric/Behavioral:  Positive for confusion, dysphoric mood and suicidal ideas. The patient is nervous/anxious.   All other systems reviewed and are negative.      Objective:   Physical Exam Gen: no distress, normal appearing HEENT: oral mucosa pink and moist, NCAT Cardio: Reg rate Chest: normal effort, normal rate of breathing Abd: soft, non-distended Ext: no edema Psych: pleasant, normal affect Skin: intact Neuro: Alert and oriented x3, exquisitely tender to pain in lumbar spine, particularly right lower side    Assessment &  Plan:  1)Chronic pain syndrome and fatigue secondary to myalgias and arthralgias following Dengue fever -Discussed current symptoms of pain and history of pain.  -Discussed benefits of exercise in reducing pain. -discussed that XRs of hip and knees are normal -ordered XRs of spine as well. -refilled Percocet -discussed tolerance and addiction, she does experience tolerance  -continue water aerobics -asked staff to schedule appointment for her tomorrow at 8:20am for trigger point injections -discussed stronger muscle relaxer such as tizanidine but she does not want to try this -discussed increasing amitriptyline dose further but she does not want to try this -discussed coming into clinic for urine sample/signing pain contract in order to received controlled medications.  -cannot increase meloxicam further given risk of GI bleeding.  -cannot use cymbalta due to risk of bleed.  -discussed risks and of tylenol and NSAIDs.  -prescribed lidocaine patches.  -discussed spinal cord stimulator -Discussed Qutenza as an option for neuropathic pain control. Discussed that this is a capsaicin patch, stronger than capsaicin cream. Discussed that it is currently approved for diabetic peripheral neuropathy and post-herpetic neuralgia, but that it has also shown benefit in treating other forms of neuropathy. Provided patient with link to site to learn more about the patch: https://www.clark.biz/. Discussed that the patch would be placed in office and benefits usually last 3 months. Discussed that unintended exposure to capsaicin can cause severe irritation of eyes, mucous membranes, respiratory tract, and skin, but that Qutenza is a local treatment and does not have the systemic side effects of other nerve medications. Discussed that there may be pain, itching, erythema, and decreased sensory function associated with the application of Qutenza.  Side effects usually subside within 1 week. A cold pack of analgesic  medications can help with these side effects. Blood pressure can also be increased due to pain associated with administration of the patch.  -tylenol does not help -referred to [facility] to discuss spinal cord stimulator -Provided with a pain relief journal and discussed that it contains foods and lifestyle tips to naturally help to improve pain. Discussed that these lifestyle strategies are also very good for health unlike some medications which can have negative side effects. Discussed that the act of keeping a journal can be therapeutic and helpful to realize patterns what helps to trigger and alleviate pain.   -lidocaine patches do help -increase gabapentin to 300mg  TID -Discussed current symptoms of pain and history of pain.  -Discussed benefits of exercise in reducing pain. -Discussed following foods that may reduce pain: 1) Ginger (especially studied for arthritis)- reduce leukotriene production to decrease inflammation 2) Blueberries- high in phytonutrients that decrease inflammation 3) Salmon- marine omega-3s reduce joint swelling and pain 4) Pumpkin seeds- reduce inflammation 5) dark chocolate- reduces inflammation 6) turmeric- reduces inflammation 7) tart cherries - reduce pain and stiffness 8) extra virgin olive oil - its compound olecanthal helps to block prostaglandins  9) chili peppers- can be eaten or applied topically via capsaicin 10) mint- helpful for headache, muscle aches, joint pain, and itching 11) garlic- reduces inflammation  Link to further information on diet for chronic pain: http://www.bray.com/   2) Insomnia:  -continue  amitriptyline 10mg  HS -seroquel produced hallucinations.  -Try to go outside near sunrise -Get exercise during the day.  -Turn off all devices an hour before bedtime.  -Teas that can benefit: chamomile, valerian root, Brahmi (Bacopa) -Can consider over the  counter melatonin, magnesium, and/or L-theanine. Melatonin is an anti-oxidant with multiple health benefits. Magnesium is involved in greater than 300 enzymatic reactions in the body and most of [place] are deficient as our soil is often depleted. There are 7 different types of magnesium- Bioptemizer's is a supplement with all 7 types, and each has unique benefits. Magnesium can also help with constipation and anxiety.  -Pistachios naturally increase the production of melatonin -Cozy Earth bamboo bed sheets are free from toxic chemicals.  -Tart cherry juice or a tart cherry supplement can improve sleep and soreness post-workout      3) Left sided weakness/memory loss -MRI brain ordered to assess for possible contributory neurological process: reviewed results with her: shows degeneration atypical for her age without acute process -referred to neurology -restart 1TB coconut oil daily, discussed may feel nausea at first, goal to increase to 4 TB over time as tolerated  .   5) Myofascial pain: trigger point injections performed today -continue muscle relaxers.  Trigger Point Injection  Indication: Lumbar myofascial pain not relieved by medication management and other conservative care.  Informed consent was obtained after describing risk and benefits of the procedure with the patient, this includes bleeding, bruising, infection and medication side effects.  The patient wishes to proceed and has given written consent.  The patient was placed in a seated position.  The area of pain was marked and prepped with Betadine.  It was entered with a 25-gauge 1/2 inch needle and a total of 5 mL of 1% lidocaine and normal saline was injected into a total of 4 trigger points, after negative draw back for blood.  The patient tolerated the procedure well.  Post procedure instructions were given.    6) Lumbar  disc degeneration with facet hypertrophy -prescribed topamax 25mg  BID -lidocaine patches seem to  help -continue Aqua Therapy -Reviewed MRI of lumbar spine with her which does mild facet hypertrophy at multiple levels -Discussed poor response to medial branch block.  -continue hydrocodone  7) Visual deficits:  -encouraged follow-up with optometrist.  -advised that this could contribute to headaches.   8) Impaired mobility and ADLs -placed order for wheelchair  9) Thoracic spine pain: -discussed that large breasts can contribute -continue Ozempic, discussed that she has lost 8 lbs.   10) Right sided groin discomfort: -pelvic XR ordered  11) Anxiety: -Discussed exercise and meditation as tools to decrease anxiety. -Recommended Down Dog Yoga app -Discussed spending time outdoors. -Discussed positive re-framing of anxiety.  -Discussed the following foods that have been show to reduce anxiety: 1) [place] nuts, mushrooms, soy beans due to their high selenium content. Upper limit of toxicity of selenium is 461mcg/day so no more than 3-4 [place] nuts per day.  2) Fatty fish such as salmon, mackerel, sardines, trout, and herring- high in omega-3 fatty acids 3) Eggs- increases serotonin and dopamine 4) Pumpkin seeds- high in omega-3 fatty acids 5) dark chocolate- high in flavanols that increase blood flow to brain 6) turmeric- take with black pepper to increase absorption 7) chamomile tea- antioxidant and anti-inflammatory properties 8) yogurt without sugar- supports gut-brain axis 9) green tea- contains L- theanine 10) blueberries- high in vitamin C and antioxidants 11) [place]- high in tryptophan which gets converted to serotonin 12) bell peppers- rich in vitamin C and antioxidants 13) citrus fruits- rich in vitamin C and antioxidants 14) almonds- high in vitamin E and healthy fats 15) chia seeds- high in omega-3 fatty acids  12) Neuropathy: -Discussed Qutenza as an option for neuropathic pain control. Discussed that this is a capsaicin patch, stronger than capsaicin cream.  Discussed that it is currently approved for diabetic peripheral neuropathy and post-herpetic neuralgia, but that it has also shown benefit in treating other forms of neuropathy. Provided patient with link to site to learn more about the patch: https://www.clark.biz/. Discussed that the patch would be placed in office and benefits usually last 3 months. Discussed that unintended exposure to capsaicin can cause severe irritation of eyes, mucous membranes, respiratory tract, and skin, but that Qutenza is a local treatment and does not have the systemic side effects of other nerve medications. Discussed that there may be pain, itching, erythema, and decreased sensory function associated with the application of Qutenza. Side effects usually subside within 1 week. A cold pack of analgesic medications can help with these side effects. Blood pressure can also be increased due to pain associated with administration of the patch.

## 2023-03-12 NOTE — Progress Notes (Signed)
Trigger Point Injection  Indication: Lumbar myofascial pain not relieved by medication management and other conservative care.  Informed consent was obtained after describing risk and benefits of the procedure with the patient, this includes bleeding, bruising, infection and medication side effects.  The patient wishes to proceed and has given written consent.  The patient was placed in a seated position.  The area of pain was marked and prepped with Betadine.  It was entered with a 25-gauge 1/2 inch needle and a total of 5 mL of 1% lidocaine and normal saline was injected into a total of 4 trigger points, after negative draw back for blood.  The patient tolerated the procedure well.  Post procedure instructions were given.     

## 2023-03-18 ENCOUNTER — Other Ambulatory Visit: Payer: Self-pay

## 2023-03-20 ENCOUNTER — Other Ambulatory Visit: Payer: Self-pay

## 2023-03-20 ENCOUNTER — Encounter: Payer: Medicare HMO | Admitting: Physical Medicine and Rehabilitation

## 2023-03-20 ENCOUNTER — Other Ambulatory Visit (HOSPITAL_COMMUNITY): Payer: Self-pay

## 2023-03-24 ENCOUNTER — Other Ambulatory Visit (HOSPITAL_COMMUNITY): Payer: Self-pay

## 2023-03-24 ENCOUNTER — Telehealth: Payer: Self-pay | Admitting: *Deleted

## 2023-03-24 ENCOUNTER — Encounter: Payer: Medicare HMO | Admitting: Physical Medicine and Rehabilitation

## 2023-03-24 NOTE — Progress Notes (Signed)
  Care Coordination Note  03/24/2023 Name: [name] MRN: [id number] DOB: 07-Dec-1965  Karen Whitehead is a 58 y.o. year old female who is a primary care patient of Hoy Register, MD and is actively engaged with the care management team. I reached out to Karen Whitehead by phone today to assist with re-scheduling a follow up visit with the Licensed Clinical Social Worker  Follow up plan: Unsuccessful telephone outreach attempt made. A HIPAA compliant phone message was left for the patient providing contact information and requesting a return call.   [facility]  Care Coordination Care Guide  Direct Dial: [phone]

## 2023-04-03 ENCOUNTER — Other Ambulatory Visit: Payer: Self-pay

## 2023-04-03 ENCOUNTER — Other Ambulatory Visit (HOSPITAL_COMMUNITY): Payer: Self-pay

## 2023-04-06 ENCOUNTER — Other Ambulatory Visit: Payer: Self-pay

## 2023-04-06 NOTE — Progress Notes (Signed)
  Care Coordination Note  04/06/2023 Name: [name] MRN: [id number] DOB: Aug 01, 1965  Karen Whitehead is a 57 y.o. year old female who is a primary care patient of Hoy Register, MD and is actively engaged with the care management team. I reached out to Karen Whitehead by phone today to assist with re-scheduling a follow up visit with the Licensed Clinical Social Worker  Follow up plan: Unsuccessful telephone outreach attempt made. A HIPAA compliant phone message was left for the patient providing contact information and requesting a return call.  We have been unable to make contact with the patient for follow up. The care management team is available to follow up with the patient after provider conversation with the patient regarding recommendation for care management engagement and subsequent re-referral to the care management team.   [facility] Coordination Care Guide  Direct Dial: [phone]

## 2023-04-21 ENCOUNTER — Telehealth: Payer: Self-pay

## 2023-04-21 ENCOUNTER — Encounter: Payer: Medicare HMO | Admitting: Physical Medicine and Rehabilitation

## 2023-04-21 ENCOUNTER — Other Ambulatory Visit: Payer: Self-pay

## 2023-04-21 ENCOUNTER — Other Ambulatory Visit (HOSPITAL_COMMUNITY): Payer: Self-pay

## 2023-04-21 MED ORDER — OXYCODONE-ACETAMINOPHEN 7.5-325 MG PO TABS
1.0000 | ORAL_TABLET | ORAL | 0 refills | Status: DC | PRN
  Filled 2023-04-21 (×2): qty 180, 30d supply, fill #0

## 2023-04-21 NOTE — Telephone Encounter (Signed)
PMP was Reviewed.  Oxycodone e-scribed to pharmacy today.  Karen Whitehead appointment had to be changed due to provider being out.  Karen Whitehead was sent a  My-Chart regarding the above.

## 2023-04-21 NOTE — Telephone Encounter (Signed)
Had to reschedule patient due to provider being out. Last filled Oxycodone/APAP on 03/20/23 #180, next appt 05/20/22. Can you send patient refill?

## 2023-05-19 ENCOUNTER — Other Ambulatory Visit: Payer: Self-pay | Admitting: Family Medicine

## 2023-05-19 DIAGNOSIS — Z7985 Long-term (current) use of injectable non-insulin antidiabetic drugs: Secondary | ICD-10-CM

## 2023-05-19 DIAGNOSIS — E1165 Type 2 diabetes mellitus with hyperglycemia: Secondary | ICD-10-CM

## 2023-05-19 MED ORDER — OZEMPIC (1 MG/DOSE) 4 MG/3ML ~~LOC~~ SOPN
1.0000 mg | PEN_INJECTOR | SUBCUTANEOUS | 0 refills | Status: DC
  Filled 2023-05-19: qty 3, 28d supply, fill #0

## 2023-05-20 ENCOUNTER — Other Ambulatory Visit: Payer: Self-pay

## 2023-05-21 ENCOUNTER — Encounter: Payer: Medicare HMO | Admitting: Physical Medicine and Rehabilitation

## 2023-05-21 ENCOUNTER — Encounter: Payer: Medicare HMO | Attending: Physical Medicine and Rehabilitation | Admitting: Physical Medicine and Rehabilitation

## 2023-05-21 VITALS — BP 132/86 | HR 77 | Ht 64.0 in | Wt 196.0 lb

## 2023-05-21 DIAGNOSIS — M7918 Myalgia, other site: Secondary | ICD-10-CM | POA: Diagnosis not present

## 2023-05-21 MED ORDER — SODIUM CHLORIDE (PF) 0.9 % IJ SOLN: 2.0000 mL | INTRAMUSCULAR | Status: AC | PRN

## 2023-05-21 MED ORDER — LIDOCAINE HCL 1 % IJ SOLN: 3.0000 mL | Freq: Once | INTRAMUSCULAR | Status: AC

## 2023-05-21 NOTE — Progress Notes (Signed)
Trigger Point Injection  Indication: Lumbar myofascial pain not relieved by medication management and other conservative care.  Informed consent was obtained after describing risk and benefits of the procedure with the patient, this includes bleeding, bruising, infection and medication side effects.  The patient wishes to proceed and has given written consent.  The patient was placed in a seated position.  The area of pain was marked and prepped with Betadine.  It was entered with a 25-gauge 1/2 inch needle and a total of 5 mL of 1% lidocaine and normal saline was injected into a total of 4 trigger points, after negative draw back for blood.  The patient tolerated the procedure well.  Post procedure instructions were given.     

## 2023-05-22 ENCOUNTER — Other Ambulatory Visit: Payer: Self-pay | Admitting: Registered Nurse

## 2023-05-26 ENCOUNTER — Encounter: Payer: Medicare HMO | Admitting: Physical Medicine and Rehabilitation

## 2023-05-27 ENCOUNTER — Telehealth: Payer: Self-pay | Admitting: Registered Nurse

## 2023-05-27 ENCOUNTER — Other Ambulatory Visit (HOSPITAL_COMMUNITY): Payer: Self-pay

## 2023-05-27 MED ORDER — OXYCODONE-ACETAMINOPHEN 7.5-325 MG PO TABS
1.0000 | ORAL_TABLET | ORAL | 0 refills | Status: DC | PRN
  Filled 2023-05-27: qty 180, 30d supply, fill #0

## 2023-05-27 NOTE — Telephone Encounter (Signed)
PMP was Reviewed,  Oxycodone e-scribed to pharmacy.  Karen Whitehead is aware via My-Chart

## 2023-05-28 ENCOUNTER — Other Ambulatory Visit: Payer: Self-pay

## 2023-06-02 ENCOUNTER — Ambulatory Visit: Payer: No Typology Code available for payment source | Admitting: Family Medicine

## 2023-06-17 ENCOUNTER — Telehealth: Payer: Self-pay

## 2023-06-17 NOTE — Telephone Encounter (Signed)
Qutenza has been denied stating it is not a covered diagnosis.

## 2023-06-20 ENCOUNTER — Other Ambulatory Visit: Payer: Self-pay | Admitting: Family Medicine

## 2023-06-20 ENCOUNTER — Other Ambulatory Visit (HOSPITAL_COMMUNITY): Payer: Self-pay

## 2023-06-20 DIAGNOSIS — Z7985 Long-term (current) use of injectable non-insulin antidiabetic drugs: Secondary | ICD-10-CM

## 2023-06-20 DIAGNOSIS — E1165 Type 2 diabetes mellitus with hyperglycemia: Secondary | ICD-10-CM

## 2023-06-22 ENCOUNTER — Other Ambulatory Visit (HOSPITAL_COMMUNITY): Payer: Self-pay

## 2023-06-22 ENCOUNTER — Other Ambulatory Visit: Payer: Self-pay

## 2023-06-22 MED ORDER — OZEMPIC (1 MG/DOSE) 4 MG/3ML ~~LOC~~ SOPN
1.0000 mg | PEN_INJECTOR | SUBCUTANEOUS | 0 refills | Status: DC
  Filled 2023-06-22 – 2023-06-28 (×2): qty 3, 28d supply, fill #0

## 2023-06-24 ENCOUNTER — Other Ambulatory Visit: Payer: Self-pay

## 2023-06-29 ENCOUNTER — Other Ambulatory Visit: Payer: Self-pay

## 2023-06-29 ENCOUNTER — Other Ambulatory Visit (HOSPITAL_COMMUNITY): Payer: Self-pay

## 2023-06-30 ENCOUNTER — Encounter: Payer: 59 | Attending: Physical Medicine and Rehabilitation | Admitting: Physical Medicine and Rehabilitation

## 2023-06-30 ENCOUNTER — Other Ambulatory Visit (HOSPITAL_COMMUNITY): Payer: Self-pay

## 2023-06-30 ENCOUNTER — Other Ambulatory Visit: Payer: Self-pay

## 2023-06-30 VITALS — BP 151/94 | HR 74 | Ht 64.0 in | Wt 193.0 lb

## 2023-06-30 DIAGNOSIS — M7918 Myalgia, other site: Secondary | ICD-10-CM | POA: Diagnosis not present

## 2023-06-30 MED ORDER — SODIUM CHLORIDE 0.9% FLUSH
2.0000 mL | Freq: Once | INTRAVENOUS | Status: AC
  Administered 2023-06-30: 2 mL

## 2023-06-30 MED ORDER — OXYCODONE-ACETAMINOPHEN 7.5-325 MG PO TABS
1.0000 | ORAL_TABLET | ORAL | 0 refills | Status: DC | PRN
  Filled 2023-06-30 (×2): qty 180, 30d supply, fill #0

## 2023-06-30 MED ORDER — LIDOCAINE HCL 1 % IJ SOLN
3.0000 mL | Freq: Once | INTRAMUSCULAR | Status: AC
  Administered 2023-06-30: 3 mL

## 2023-06-30 NOTE — Addendum Note (Signed)
 Addended by: Horton Chin on: 06/30/2023 01:21 PM   Modules accepted: Orders

## 2023-06-30 NOTE — Progress Notes (Signed)
Trigger Point Injection  Indication: Lumbar myofascial pain not relieved by medication management and other conservative care.  Informed consent was obtained after describing risk and benefits of the procedure with the patient, this includes bleeding, bruising, infection and medication side effects.  The patient wishes to proceed and has given written consent.  The patient was placed in a seated position.  The area of pain was marked and prepped with Betadine.  It was entered with a 25-gauge 1/2 inch needle and a total of 5 mL of 1% lidocaine and normal saline was injected into a total of 4 trigger points, after negative draw back for blood.  The patient tolerated the procedure well.  Post procedure instructions were given.     

## 2023-06-30 NOTE — Patient Instructions (Signed)
 Topical capsaicin 10-14 days 4 x per day

## 2023-07-01 ENCOUNTER — Other Ambulatory Visit (HOSPITAL_COMMUNITY): Payer: Self-pay

## 2023-07-07 ENCOUNTER — Other Ambulatory Visit: Payer: Self-pay

## 2023-07-07 ENCOUNTER — Other Ambulatory Visit (HOSPITAL_COMMUNITY): Payer: Self-pay

## 2023-07-07 ENCOUNTER — Encounter: Payer: Self-pay | Admitting: Family Medicine

## 2023-07-07 ENCOUNTER — Ambulatory Visit: Payer: No Typology Code available for payment source | Attending: Family Medicine | Admitting: Family Medicine

## 2023-07-07 VITALS — BP 128/78 | HR 78 | Ht 64.0 in | Wt 191.6 lb

## 2023-07-07 DIAGNOSIS — I152 Hypertension secondary to endocrine disorders: Secondary | ICD-10-CM

## 2023-07-07 DIAGNOSIS — F411 Generalized anxiety disorder: Secondary | ICD-10-CM

## 2023-07-07 DIAGNOSIS — E1165 Type 2 diabetes mellitus with hyperglycemia: Secondary | ICD-10-CM

## 2023-07-07 DIAGNOSIS — Z1231 Encounter for screening mammogram for malignant neoplasm of breast: Secondary | ICD-10-CM

## 2023-07-07 DIAGNOSIS — Z7985 Long-term (current) use of injectable non-insulin antidiabetic drugs: Secondary | ICD-10-CM | POA: Diagnosis not present

## 2023-07-07 DIAGNOSIS — F3181 Bipolar II disorder: Secondary | ICD-10-CM | POA: Diagnosis not present

## 2023-07-07 DIAGNOSIS — E1169 Type 2 diabetes mellitus with other specified complication: Secondary | ICD-10-CM

## 2023-07-07 DIAGNOSIS — E785 Hyperlipidemia, unspecified: Secondary | ICD-10-CM

## 2023-07-07 DIAGNOSIS — E1159 Type 2 diabetes mellitus with other circulatory complications: Secondary | ICD-10-CM

## 2023-07-07 DIAGNOSIS — E876 Hypokalemia: Secondary | ICD-10-CM

## 2023-07-07 LAB — POCT GLYCOSYLATED HEMOGLOBIN (HGB A1C): HbA1c, POC (controlled diabetic range): 5.7 % (ref 0.0–7.0)

## 2023-07-07 MED ORDER — OZEMPIC (1 MG/DOSE) 4 MG/3ML ~~LOC~~ SOPN
1.0000 mg | PEN_INJECTOR | SUBCUTANEOUS | 6 refills | Status: DC
  Filled 2023-07-07 – 2023-07-28 (×2): qty 3, 28d supply, fill #0
  Filled 2023-08-25: qty 3, 28d supply, fill #1
  Filled 2023-09-22: qty 3, 28d supply, fill #2
  Filled 2023-10-19: qty 3, 28d supply, fill #3
  Filled 2023-11-19: qty 3, 28d supply, fill #4

## 2023-07-07 MED ORDER — ONETOUCH VERIO W/DEVICE KIT
PACK | 0 refills | Status: AC
  Filled 2023-07-07: qty 1, fill #0
  Filled 2023-07-07: qty 1, 30d supply, fill #0

## 2023-07-07 MED ORDER — SERTRALINE HCL 100 MG PO TABS
200.0000 mg | ORAL_TABLET | Freq: Every day | ORAL | 0 refills | Status: DC
  Filled 2023-07-07: qty 30, 15d supply, fill #0
  Filled 2023-07-07: qty 30, 30d supply, fill #0

## 2023-07-07 NOTE — Progress Notes (Signed)
 Subjective:  Patient ID: Karen Whitehead, female    DOB: 1966/02/21  Age: 58 y.o. MRN: [id number]  CC: Medical Management of Chronic Issues (Discuss refill on BH medication/Discuss Ozempic increase)     Discussed the use of AI scribe software for clinical note transcription with the patient, who gave verbal consent to proceed.  History of Present Illness The patient, with a history of diabetes and mental health concerns, presents for a routine follow-up. She reports dissatisfaction with the quality of her current glucometer, describing it as 'flimsy' and difficult to use. Despite this, her A1C is well-controlled at 5.7. She has been monitoring her blood pressure at home, which has been generally well-controlled, with a single recent high reading. She denies any numbness in her hands or feet, but reports numbness in her legs related to monthly back injections for an unspecified condition.  The patient is currently on Ozempic 1mg  once weekly for diabetes management and is satisfied with this medication. She expresses concern about her mental health, as she has been unable to refill her Zoloft and Risperidone due to missed appointments with behavioral health and transportation issues. She requests a refill of her Zoloft to help manage her symptoms until she can arrange a visit with her mental health provider.  The patient also reports that she has not been taking her atorvastatin, as she forgot to reorder it. She expresses willingness to restart this medication.  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    Past Medical History:  Diagnosis Date   Anxiety    Arthritis    Chronic kidney disease    Depression    Diabetes mellitus without complication (HCC)    Granuloma annulare    Hyperlipidemia     Past Surgical History:  Procedure Laterality Date   APPENDECTOMY      Family History  Problem  Relation Age of Onset   Stomach cancer Mother    Heart attack Father    Breast cancer Neg Hx    Colon cancer Neg Hx    Colon polyps Neg Hx     Social History   Socioeconomic History   Marital status: Single    Spouse name: Not on file   Number of children: 1   Years of education: Not on file   Highest education level: Some college, no degree  Occupational History   Not on file  Tobacco Use   Smoking status: Every Day    Current packs/day: 0.50    Types: Cigarettes   Smokeless tobacco: Never  Vaping Use   Vaping status: Never Used  Substance and Sexual Activity   Alcohol use: No   Drug use: No   Sexual activity: Not Currently  Other Topics Concern   Not on file  Social History Narrative   Not on file   Social Drivers of Health   Financial Resource Strain: Not on file  Food Insecurity: No Food Insecurity (12/24/2022)   Hunger Vital Sign    Worried About Running Out of Food in the Last Year: Never true    Ran Out of Food in the Last Year: Never true  Transportation Needs: Unmet Transportation Needs (12/24/2022)   PRAPARE - Transportation    Lack of Transportation (Medical): Yes    Lack of Transportation (Non-Medical): Yes  Physical Activity: Inactive (01/14/2023)   Exercise Vital Sign    Days of Exercise per Week: 0 days  Minutes of Exercise per Session: 0 min  Stress: Stress Concern Present (01/14/2023)   [employer] of Occupational Health - Occupational Stress Questionnaire    Feeling of Stress : Rather much  Social Connections: Not on file    No Known Allergies  Outpatient Medications Prior to Visit  Medication Sig Dispense Refill   amitriptyline (ELAVIL) 10 MG tablet Take 1 tablet (10 mg total) by mouth at bedtime. 30 tablet 1   amLODipine (NORVASC) 5 MG tablet Take 1 tablet (5 mg total) by mouth daily. 90 tablet 1   atorvastatin (LIPITOR) 40 MG tablet Take 1 tablet (40 mg total) by mouth daily. discontinue 20mg  90 tablet 1   Blood Glucose Monitoring  Suppl (BLOOD GLUCOSE MONITOR SYSTEM) w/Device KIT Use to check blood sugar in the morning and at bedtime. 1 kit 0   clonazePAM (KLONOPIN) 0.5 MG tablet Take 1 tablet (0.5 mg total) by mouth 2 (two) times daily as needed. for anxiety 60 tablet 2   cyclobenzaprine (FLEXERIL) 10 MG tablet Take 1 tablet (10 mg total) by mouth 2 (two) times daily as needed for muscle spasms as needed for muscle pain 60 tablet 6   divalproex (DEPAKOTE ER) 500 MG 24 hr tablet Take 1 tablet (500 mg total) by mouth at bedtime. 30 tablet 6   fluticasone (CUTIVATE) 0.005 % ointment Apply topically 2 times daily. 30 g 0   gabapentin (NEURONTIN) 300 MG capsule Take 1 capsule (300 mg total) by mouth 3 (three) times daily. 90 capsule 6   glucose blood (ONETOUCH VERIO) test strip Use to check blood sugar in the morning and at bedtime. 100 each 6   ibuprofen (ADVIL) 200 MG tablet Take 200 mg by mouth every 6 (six) hours as needed for headache, mild pain or moderate pain.     Lancets (ONETOUCH DELICA PLUS LANCET33G) MISC Use to check blood sugar in the morning and at bedtime. 100 each 6   lidocaine (LIDODERM) 5 % Place 2 patches onto the skin daily. Remove & Discard patch within 12 hours or as directed by MD 60 patch 11   ondansetron (ZOFRAN-ODT) 4 MG disintegrating tablet Take 1 tablet (4 mg total) by mouth every 8 (eight) hours as needed for nausea or vomiting. 20 tablet 0   oxyCODONE-acetaminophen (PERCOCET) 7.5-325 MG tablet Take 1 tablet by mouth every 4 (four) hours as needed for severe pain (pain score 7-10). 180 tablet 0   risperiDONE (RISPERDAL) 1 MG tablet Take 1 tablet (1 mg total) by mouth 2 (two) times daily. 60 tablet 3   SUMAtriptan (IMITREX) 50 MG tablet Take 1 tablet (50 mg total) by mouth every 2 (two) hours as needed for migraine. May repeat in 2 hours if headache persists or recurs. 12 tablet 6   topiramate (TOPAMAX) 50 MG tablet Take 1 tablet (50 mg total) by mouth 2 (two) times daily. 60 tablet 3   traZODone  (DESYREL) 100 MG tablet Take 2 tablets (200 mg total) by mouth at bedtime. 60 tablet 3   Vitamin D, Ergocalciferol, (DRISDOL) 1.25 MG (50000 UNIT) CAPS capsule Take 1 capsule (50,000 Units total) by mouth every 7 (seven) days. 7 capsule 0   Blood Glucose Monitoring Suppl (ONETOUCH VERIO) w/Device KIT Use to check blood sugar in the morning and at bedtime 1 kit 0   Semaglutide, 1 MG/DOSE, (OZEMPIC, 1 MG/DOSE,) 4 MG/3ML SOPN Inject 1 mg as directed once a week. 3 mL 0   sertraline (ZOLOFT) 100 MG tablet TAKE 2 TABLETS  BY MOUTH ONCE A DAY 60 tablet 3   albuterol (VENTOLIN HFA) 108 (90 Base) MCG/ACT inhaler INHALE 1-2 PUFFS INTO THE LUNGS EVERY 6 (SIX) HOURS AS NEEDED FOR WHEEZING OR SHORTNESS OF BREATH. (Patient taking differently: Inhale 2 puffs into the lungs every 6 (six) hours as needed for wheezing or shortness of breath.) 18 g 1   amoxicillin (AMOXIL) 500 MG capsule Take 500 mg by mouth daily. (Patient not taking: Reported on 07/07/2023)     Facility-Administered Medications Prior to Visit  Medication Dose Route Frequency Provider Last Rate Last Admin   lidocaine (XYLOCAINE) 1 % (with pres) injection 3 mL  3 mL Intradermal Once Raulkar, Drema Pry, MD       lidocaine (XYLOCAINE) 1 % (with pres) injection 3 mL  3 mL Other Once Raulkar, Drema Pry, MD       lidocaine (XYLOCAINE) 1 % (with pres) injection 5 mL  5 mL Intradermal Once Raulkar, Drema Pry, MD       sodium chloride (PF) 0.9 % injection 2 mL  2 mL Intravenous Daily Raulkar, Drema Pry, MD   2 mL at 07/24/22 1138   sodium chloride (PF) 0.9 % injection 2 mL  2 mL Intravenous PRN        sodium chloride (PF) 0.9 % injection 2 mL  2 mL Intravenous PRN Raulkar, Drema Pry, MD         ROS Review of Systems  Constitutional:  Negative for activity change and appetite change.  HENT:  Negative for sinus pressure and sore throat.   Respiratory:  Negative for chest tightness, shortness of breath and wheezing.   Cardiovascular:  Negative for chest  pain and palpitations.  Gastrointestinal:  Negative for abdominal distention, abdominal pain and constipation.  Genitourinary: Negative.   Musculoskeletal:  Positive for back pain.  Psychiatric/Behavioral:  Negative for behavioral problems and dysphoric mood.     Objective:  BP 128/78   Pulse 78   Ht 5\' 4"  (1.626 m)   Wt 191 lb 9.6 oz (86.9 kg)   SpO2 98%   BMI 32.89 kg/m      07/07/2023    1:45 PM 06/30/2023    1:01 PM 05/21/2023   10:18 AM  BP/Weight  Systolic BP 128 151 132  Diastolic BP 78 94 86  Wt. (Lbs) 191.6 193 196  BMI 32.89 kg/m2 33.13 kg/m2 33.64 kg/m2      Physical Exam Constitutional:      Appearance: She is well-developed.  Cardiovascular:     Rate and Rhythm: Normal rate.     Heart sounds: Normal heart sounds. No murmur heard. Pulmonary:     Effort: Pulmonary effort is normal.     Breath sounds: Normal breath sounds. No wheezing or rales.  Chest:     Chest wall: No tenderness.  Abdominal:     General: Bowel sounds are normal. There is no distension.     Palpations: Abdomen is soft. There is no mass.     Tenderness: There is no abdominal tenderness.  Musculoskeletal:        General: Normal range of motion.     Right lower leg: No edema.     Left lower leg: No edema.  Neurological:     Mental Status: She is alert and oriented to person, place, and time.  Psychiatric:        Mood and Affect: Mood normal.    Diabetic Foot Exam - Simple   Simple Foot Form Diabetic Foot exam  was performed with the following findings: Yes 07/07/2023  3:20 PM  Visual Inspection No deformities, no ulcerations, no other skin breakdown bilaterally: Yes Sensation Testing Intact to touch and monofilament testing bilaterally: Yes Pulse Check Posterior Tibialis and Dorsalis pulse intact bilaterally: Yes Comments        Latest Ref Rng & Units 12/17/2022    4:43 PM 11/26/2022    3:27 PM 05/01/2022   12:21 PM  CMP  Glucose 70 - 99 mg/dL 161  94  096   BUN 6 - 20 mg/dL  5  11  8    Creatinine 0.44 - 1.00 mg/dL 0.45  4.09  8.11   Sodium 135 - 145 mmol/L 138  138  143   Potassium 3.5 - 5.1 mmol/L 2.8  4.5  4.6   Chloride 98 - 111 mmol/L 97  98  101   CO2 22 - 32 mmol/L 29  25  22    Calcium 8.9 - 10.3 mg/dL 9.3  9.5  91.4   Total Protein 6.5 - 8.1 g/dL 7.6  7.4  7.6   Total Bilirubin 0.3 - 1.2 mg/dL 0.9  0.3  0.4   Alkaline Phos 38 - 126 U/L 44  67  78   AST 15 - 41 U/L 22  31  20    ALT 0 - 44 U/L 27  40  25     Lipid Panel     Component Value Date/Time   CHOL 162 10/31/2021 1202   TRIG 101 10/31/2021 1202   HDL 49 10/31/2021 1202   CHOLHDL 6.0 (H) 10/31/2020 1004   LDLCALC 94 10/31/2021 1202    CBC    Component Value Date/Time   WBC 10.0 12/17/2022 1643   RBC 5.12 (H) 12/17/2022 1643   HGB 15.4 (H) 12/17/2022 1643   HCT 44.5 12/17/2022 1643   PLT 290 12/17/2022 1643   MCV 86.9 12/17/2022 1643   MCH 30.1 12/17/2022 1643   MCHC 34.6 12/17/2022 1643   RDW 12.4 12/17/2022 1643   LYMPHSABS 3.0 11/10/2018 1315   MONOABS 0.6 11/10/2018 1315   EOSABS 0.3 11/10/2018 1315   BASOSABS 0.1 11/10/2018 1315    Lab Results  Component Value Date   HGBA1C 5.7 07/07/2023       Assessment & Plan   Type II Diabetes Mellitus Well controlled with A1c of 5.7. Reports issues with current glucometer. On Ozempic 1mg  once weekly. -Continue Ozempic 1mg  once weekly. -Order new glucometer. -Counseled on Diabetic diet, my plate method, 782 minutes of moderate intensity exercise/week Blood sugar logs with fasting goals of 80-120 mg/dl, random of less than 956 and in the event of sugars less than 60 mg/dl or greater than 213 mg/dl encouraged to notify the clinic. Advised on the need for annual eye exams, annual foot exams, Pneumonia vaccine.   Hypertension Reports good control at home, with one recent high reading at a doctor's visit. -Continue current antihypertensive regimen. -Counseled on blood pressure goal of less than 130/80, low-sodium, DASH diet,  medication compliance, 150 minutes of moderate intensity exercise per week. Discussed medication compliance, adverse effects.   Hyperlipidemia Patient has not been taking atorvastatin. Discussed the importance of statin therapy in diabetes for cardiovascular risk reduction. -Resume atorvastatin. Send prescription to pharmacy.  Bipolar depression Patient has not been taking Zoloft or Risperidal due to issues with psychiatric follow-up. Reports struggling without it. -Prescribe Zoloft 100mg  daily until patient can follow up with psychiatric services.  Hypokalemia History of low potassium  -Check potassium  level today.  General Health Maintenance -Order mammogram as patient is due for screening. -Perform foot exam today due to diabetes.      Meds ordered this encounter  Medications   Blood Glucose Monitoring Suppl (ONETOUCH VERIO) w/Device KIT    Sig: Use to check blood sugar in the morning and at bedtime    Dispense:  1 kit    Refill:  0   Semaglutide, 1 MG/DOSE, (OZEMPIC, 1 MG/DOSE,) 4 MG/3ML SOPN    Sig: Inject 1 mg as directed once a week.    Dispense:  3 mL    Refill:  6   sertraline (ZOLOFT) 100 MG tablet    Sig: Take 2 tablets (200 mg total) by mouth daily.    Dispense:  30 tablet    Refill:  0    Follow-up: Return in about 6 months (around 01/07/2024) for Chronic medical conditions.       Hoy Register, MD, FAAFP. [facility] [place], [place] [phone]   07/07/2023, 3:20 PM

## 2023-07-07 NOTE — Patient Instructions (Signed)
 VISIT SUMMARY:  Today, we discussed your diabetes management, blood pressure, cholesterol, mental health, and recent hospitalization for COVID-19. Your diabetes is well-controlled, but you mentioned issues with your current glucometer. Your blood pressure is generally well-controlled, and we discussed the importance of resuming your cholesterol medication. We also addressed your mental health concerns and the need to refill your Zoloft. Additionally, we reviewed your recent low potassium levels during your hospitalization and ordered a potassium test today. We also ordered a mammogram and performed a foot exam as part of your routine health maintenance.  YOUR PLAN:  -DIABETES MELLITUS: Diabetes Mellitus is a condition where your blood sugar levels are too high. Your A1C is well-controlled at 5.7, but you mentioned issues with your current glucometer. We will continue your Ozempic 1mg  once weekly and order a new glucometer for you.  -HYPERTENSION: Hypertension is high blood pressure. Your blood pressure is generally well-controlled at home, with one recent high reading. We will continue your current antihypertensive regimen.  -HYPERLIPIDEMIA: Hyperlipidemia is high cholesterol. You have not been taking your atorvastatin, which is important for reducing cardiovascular risk in diabetes. We discussed the importance of this medication, and you will resume taking atorvastatin. A prescription has been sent to your pharmacy.  -DEPRESSION: Depression is a mental health condition that affects your mood. You have not been taking Zoloft due to missed appointments and transportation issues. We will prescribe Zoloft 100mg  daily until you can follow up with psychiatric services.  -HYPOKALEMIA: Hypokalemia is low potassium levels in the blood. You had low potassium during your recent hospitalization for COVID-19. We will check your potassium level today to ensure it is within the normal range.  -GENERAL HEALTH  MAINTENANCE: We ordered a mammogram for you as you are due for screening. We also performed a foot exam today due to your diabetes.  INSTRUCTIONS:  Please follow up with your mental health provider to manage your Zoloft and Risperidone prescriptions. Resume taking atorvastatin as prescribed. Get your potassium level checked today. Schedule your mammogram as soon as possible. Continue monitoring your blood pressure at home and report any significant changes.

## 2023-07-08 ENCOUNTER — Encounter: Payer: Self-pay | Admitting: Family Medicine

## 2023-07-08 LAB — BASIC METABOLIC PANEL
BUN/Creatinine Ratio: 9 (ref 9–23)
BUN: 7 mg/dL (ref 6–24)
CO2: 27 mmol/L (ref 20–29)
Calcium: 9.9 mg/dL (ref 8.7–10.2)
Chloride: 101 mmol/L (ref 96–106)
Creatinine, Ser: 0.76 mg/dL (ref 0.57–1.00)
Glucose: 94 mg/dL (ref 70–99)
Potassium: 4 mmol/L (ref 3.5–5.2)
Sodium: 141 mmol/L (ref 134–144)
eGFR: 91 mL/min/{1.73_m2} (ref >59)

## 2023-07-27 ENCOUNTER — Other Ambulatory Visit (HOSPITAL_COMMUNITY): Payer: Self-pay

## 2023-07-27 ENCOUNTER — Other Ambulatory Visit: Payer: Self-pay

## 2023-07-27 ENCOUNTER — Encounter: Payer: Self-pay | Admitting: Physical Medicine and Rehabilitation

## 2023-07-27 ENCOUNTER — Encounter: Attending: Physical Medicine and Rehabilitation | Admitting: Physical Medicine and Rehabilitation

## 2023-07-27 VITALS — BP 138/87 | HR 75 | Ht 64.0 in | Wt 190.0 lb

## 2023-07-27 DIAGNOSIS — M7918 Myalgia, other site: Secondary | ICD-10-CM | POA: Insufficient documentation

## 2023-07-27 MED ORDER — JOURNAVX 50 MG PO TABS
1.0000 | ORAL_TABLET | Freq: Two times a day (BID) | ORAL | 0 refills | Status: AC | PRN
  Filled 2023-07-27: qty 60, 30d supply, fill #0
  Filled 2023-07-27: qty 60, fill #0

## 2023-07-27 MED ORDER — OXYCODONE-ACETAMINOPHEN 7.5-325 MG PO TABS
1.0000 | ORAL_TABLET | ORAL | 0 refills | Status: DC | PRN
  Filled 2023-07-27 – 2023-07-28 (×2): qty 180, 30d supply, fill #0

## 2023-07-27 MED ORDER — LIDOCAINE HCL 1 % IJ SOLN
5.0000 mL | Freq: Once | INTRAMUSCULAR | Status: AC
  Administered 2023-07-27: 5 mL via INTRADERMAL

## 2023-07-27 NOTE — Progress Notes (Signed)
Trigger Point Injection  Indication: Lumbar myofascial pain not relieved by medication management and other conservative care.  Informed consent was obtained after describing risk and benefits of the procedure with the patient, this includes bleeding, bruising, infection and medication side effects.  The patient wishes to proceed and has given written consent.  The patient was placed in a seated position.  The area of pain was marked and prepped with Betadine.  It was entered with a 25-gauge 1/2 inch needle and a total of 5 mL of 1% lidocaine and normal saline was injected into a total of 4 trigger points, after negative draw back for blood.  The patient tolerated the procedure well.  Post procedure instructions were given.     

## 2023-07-27 NOTE — Patient Instructions (Signed)
 D3 1,000U daily/K2

## 2023-07-27 NOTE — Addendum Note (Signed)
 Addended by: Horton Chin on: 07/27/2023 11:21 AM   Modules accepted: Orders

## 2023-07-28 ENCOUNTER — Other Ambulatory Visit: Payer: Self-pay

## 2023-07-28 ENCOUNTER — Other Ambulatory Visit (HOSPITAL_COMMUNITY): Payer: Self-pay

## 2023-07-31 ENCOUNTER — Other Ambulatory Visit: Payer: Self-pay

## 2023-07-31 ENCOUNTER — Other Ambulatory Visit: Payer: Self-pay | Admitting: Family Medicine

## 2023-07-31 MED ORDER — VARENICLINE TARTRATE (STARTER) 0.5 MG X 11 & 1 MG X 42 PO TBPK
ORAL_TABLET | ORAL | 0 refills | Status: DC
  Filled 2023-07-31: qty 53, fill #0
  Filled 2023-08-03: qty 53, 30d supply, fill #0

## 2023-07-31 MED ORDER — VARENICLINE TARTRATE 1 MG PO TABS
1.0000 mg | ORAL_TABLET | Freq: Two times a day (BID) | ORAL | 2 refills | Status: DC
  Filled 2023-07-31 – 2023-08-25 (×2): qty 60, 30d supply, fill #0
  Filled 2023-10-19: qty 60, 30d supply, fill #1

## 2023-07-31 MED ORDER — NICOTINE 7 MG/24HR TD PT24
7.0000 mg | MEDICATED_PATCH | Freq: Every day | TRANSDERMAL | 1 refills | Status: AC
  Filled 2023-07-31 – 2023-08-25 (×2): qty 28, 28d supply, fill #0

## 2023-08-03 ENCOUNTER — Other Ambulatory Visit: Payer: Self-pay

## 2023-08-03 ENCOUNTER — Other Ambulatory Visit (HOSPITAL_COMMUNITY): Payer: Self-pay

## 2023-08-07 ENCOUNTER — Telehealth: Payer: Self-pay

## 2023-08-07 MED ORDER — QUTENZA 8 % EX KIT: 1.0000 | PACK | Freq: Once | CUTANEOUS | 0 refills | Status: AC

## 2023-08-07 NOTE — Telephone Encounter (Signed)
 Patient is aware prescription has been sent to [facility]

## 2023-08-25 ENCOUNTER — Other Ambulatory Visit (HOSPITAL_COMMUNITY): Payer: Self-pay

## 2023-08-25 ENCOUNTER — Ambulatory Visit: Payer: 59 | Admitting: Physical Medicine and Rehabilitation

## 2023-08-25 ENCOUNTER — Other Ambulatory Visit: Payer: Self-pay

## 2023-08-25 ENCOUNTER — Other Ambulatory Visit: Payer: Self-pay | Admitting: Physical Medicine and Rehabilitation

## 2023-08-25 MED ORDER — OXYCODONE-ACETAMINOPHEN 7.5-325 MG PO TABS
1.0000 | ORAL_TABLET | ORAL | 0 refills | Status: DC | PRN
  Filled 2023-08-25: qty 180, 30d supply, fill #0

## 2023-09-23 ENCOUNTER — Other Ambulatory Visit (HOSPITAL_COMMUNITY): Payer: Self-pay

## 2023-09-23 ENCOUNTER — Other Ambulatory Visit: Payer: Self-pay | Admitting: Physical Medicine and Rehabilitation

## 2023-09-23 MED ORDER — OXYCODONE-ACETAMINOPHEN 7.5-325 MG PO TABS
1.0000 | ORAL_TABLET | ORAL | 0 refills | Status: DC | PRN
  Filled 2023-09-23: qty 180, 30d supply, fill #0

## 2023-09-24 ENCOUNTER — Encounter: Payer: Self-pay | Admitting: Physical Medicine and Rehabilitation

## 2023-09-24 ENCOUNTER — Encounter: Attending: Physical Medicine and Rehabilitation | Admitting: Physical Medicine and Rehabilitation

## 2023-09-24 VITALS — BP 130/84 | HR 80 | Ht 64.0 in | Wt 184.0 lb

## 2023-09-24 DIAGNOSIS — M7918 Myalgia, other site: Secondary | ICD-10-CM | POA: Diagnosis not present

## 2023-09-24 MED ORDER — LIDOCAINE HCL 1 % IJ SOLN
5.0000 mL | Freq: Once | INTRAMUSCULAR | Status: AC
  Administered 2023-09-24: 5 mL via INTRADERMAL

## 2023-09-24 NOTE — Patient Instructions (Signed)
5 HTP

## 2023-09-24 NOTE — Progress Notes (Signed)
 Trigger Point Injection  Indication: Lumbar and cervical myofascial pain not relieved by medication management and other conservative care.  Informed consent was obtained after describing risk and benefits of the procedure with the patient, this includes bleeding, bruising, infection and medication side effects.  The patient wishes to proceed and has given written consent.  The patient was placed in a seated position.  The area of pain was marked and prepped with Betadine.  It was entered with a 25-gauge 1/2 inch needle and a total of 5 mL of 1% lidocaine  and normal saline was injected into a total of 4 trigger points, after negative draw back for blood.  The patient tolerated the procedure well.  Post procedure instructions were given.

## 2023-10-19 ENCOUNTER — Other Ambulatory Visit: Payer: Self-pay

## 2023-10-19 ENCOUNTER — Other Ambulatory Visit: Payer: Self-pay | Admitting: Physical Medicine and Rehabilitation

## 2023-10-19 ENCOUNTER — Other Ambulatory Visit: Payer: Self-pay | Admitting: Family Medicine

## 2023-10-19 DIAGNOSIS — F411 Generalized anxiety disorder: Secondary | ICD-10-CM

## 2023-10-19 DIAGNOSIS — F3181 Bipolar II disorder: Secondary | ICD-10-CM

## 2023-10-20 ENCOUNTER — Telehealth: Payer: Self-pay | Admitting: Registered Nurse

## 2023-10-20 ENCOUNTER — Other Ambulatory Visit: Payer: Self-pay

## 2023-10-20 ENCOUNTER — Other Ambulatory Visit (HOSPITAL_COMMUNITY): Payer: Self-pay

## 2023-10-20 MED ORDER — OXYCODONE-ACETAMINOPHEN 7.5-325 MG PO TABS
1.0000 | ORAL_TABLET | ORAL | 0 refills | Status: DC | PRN
  Filled 2023-10-20 – 2023-10-22 (×3): qty 180, 30d supply, fill #0

## 2023-10-20 MED ORDER — SERTRALINE HCL 100 MG PO TABS
200.0000 mg | ORAL_TABLET | Freq: Every day | ORAL | 1 refills | Status: DC
  Filled 2023-10-20: qty 180, 90d supply, fill #0
  Filled 2023-11-19 – 2023-12-19 (×4): qty 180, 90d supply, fill #1

## 2023-10-20 NOTE — Telephone Encounter (Signed)
 PMP was Reviewed.  Oxycodone  e-scribed to pharmacy.  Call placed to Karen Whitehead regarding the above, she verbalizes understanding.  She has a scheduled appointment with this provider on 10/23/2023. She verbalizes understanding.

## 2023-10-21 ENCOUNTER — Other Ambulatory Visit (HOSPITAL_COMMUNITY): Payer: Self-pay

## 2023-10-21 ENCOUNTER — Other Ambulatory Visit: Payer: Self-pay

## 2023-10-22 ENCOUNTER — Other Ambulatory Visit (HOSPITAL_COMMUNITY): Payer: Self-pay

## 2023-10-23 ENCOUNTER — Encounter: Attending: Physical Medicine and Rehabilitation | Admitting: Registered Nurse

## 2023-10-23 ENCOUNTER — Telehealth: Payer: Self-pay | Admitting: Registered Nurse

## 2023-10-23 ENCOUNTER — Encounter: Payer: Self-pay | Admitting: Registered Nurse

## 2023-10-23 VITALS — BP 130/84 | HR 78 | Ht 64.0 in | Wt 185.0 lb

## 2023-10-23 DIAGNOSIS — G8929 Other chronic pain: Secondary | ICD-10-CM | POA: Insufficient documentation

## 2023-10-23 DIAGNOSIS — G894 Chronic pain syndrome: Secondary | ICD-10-CM | POA: Diagnosis present

## 2023-10-23 DIAGNOSIS — G629 Polyneuropathy, unspecified: Secondary | ICD-10-CM | POA: Diagnosis present

## 2023-10-23 DIAGNOSIS — Z79899 Other long term (current) drug therapy: Secondary | ICD-10-CM | POA: Diagnosis present

## 2023-10-23 DIAGNOSIS — Z5181 Encounter for therapeutic drug level monitoring: Secondary | ICD-10-CM | POA: Insufficient documentation

## 2023-10-23 DIAGNOSIS — M48062 Spinal stenosis, lumbar region with neurogenic claudication: Secondary | ICD-10-CM | POA: Diagnosis present

## 2023-10-23 DIAGNOSIS — M546 Pain in thoracic spine: Secondary | ICD-10-CM | POA: Insufficient documentation

## 2023-10-23 DIAGNOSIS — M542 Cervicalgia: Secondary | ICD-10-CM | POA: Insufficient documentation

## 2023-10-23 NOTE — Progress Notes (Signed)
 Subjective:    Patient ID: Karen Whitehead, female    DOB: 05/31/1965, 58 y.o.   MRN: [id number]  HPI: Karen Whitehead is a 58 y.o. female who returns for follow up appointment for chronic pain and medication refill. She states her pain is located in her neck, mid- lower back and bilateral lower extremities with numbness. Karen Whitehead reports her pain is not being controlled in the last two weeks with her current medication regimen, she states there were a few  days when she took 1 1/2 tables, we discussed the narcotic policy, and it was violation for self- prescribing. She is aware if this occurs again can lead to discharge from our office. The above will be discussed with Dr Alessandra Ancona, no changes to her medication today. She was instructed to keep a Pain Journal and send information to DrRaulkar in two weeks, she verbalizes understanding. She is currently taking her medication every 4 hours, she is aware we will not be changing the frequency in her medication, she verbalizes understanding.  She will be mailed a warning letter regarding the above. She verbalizes understanding.   She rates her pain 5. Her current exercise regime is walking and performing stretching exercises.  Karen Whitehead Morphine equivalent is 67.50 MME.   UDS ordered today.      Pain Inventory Average Pain 8 Pain Right Now 5 My pain is constant, burning, and aching  In the last 24 hours, has pain interfered with the following? General activity 5 Relation with others 0 Enjoyment of life 0 What TIME of day is your pain at its worst? daytime, evening, and night Sleep (in general) Fair  Pain is worse with: walking, bending, sitting, and standing Pain improves with: rest, heat/ice, medication, and injections Relief from Meds: fair  Family History  Problem Relation Age of Onset   Stomach cancer Mother    Heart attack Father    Breast cancer Neg Hx    Colon cancer Neg Hx    Colon polyps Neg Hx    Social History    Socioeconomic History   Marital status: Single    Spouse name: Not on file   Number of children: 1   Years of education: Not on file   Highest education level: Some college, no degree  Occupational History   Not on file  Tobacco Use   Smoking status: Every Day    Current packs/day: 0.50    Types: Cigarettes   Smokeless tobacco: Never  Vaping Use   Vaping status: Never Used  Substance and Sexual Activity   Alcohol use: No   Drug use: No   Sexual activity: Not Currently  Other Topics Concern   Not on file  Social History Narrative   Not on file   Social Drivers of Health   Financial Resource Strain: Not on file  Food Insecurity: No Food Insecurity (12/24/2022)   Hunger Vital Sign    Worried About Running Out of Food in the Last Year: Never true    Ran Out of Food in the Last Year: Never true  Transportation Needs: Unmet Transportation Needs (12/24/2022)   PRAPARE - Transportation    Lack of Transportation (Medical): Yes    Lack of Transportation (Non-Medical): Yes  Physical Activity: Inactive (01/14/2023)   Exercise Vital Sign    Days of Exercise per Week: 0 days    Minutes of Exercise per Session: 0 min  Stress: Stress Concern Present (01/14/2023)   [employer] of Occupational Health -  Occupational Stress Questionnaire    Feeling of Stress : Rather much  Social Connections: Not on file   Past Surgical History:  Procedure Laterality Date   APPENDECTOMY     Past Surgical History:  Procedure Laterality Date   APPENDECTOMY     Past Medical History:  Diagnosis Date   Anxiety    Arthritis    Chronic kidney disease    Depression    Diabetes mellitus without complication (HCC)    Granuloma annulare    Hyperlipidemia    BP 130/84   Pulse 78   Ht 5' 4 (1.626 m)   Wt 185 lb (83.9 kg)   SpO2 98%   BMI 31.76 kg/m   Opioid Risk Score:   Fall Risk Score:  `1  Depression screen [facility] 2/9     09/24/2023   10:02 AM 07/27/2023   10:50 AM 07/07/2023    1:48  PM 06/30/2023    1:03 PM 05/21/2023   10:22 AM 02/17/2023   10:27 AM 01/14/2023    9:28 AM  Depression screen PHQ 2/9  Decreased Interest 1 1 2  0 0 0 1  Down, Depressed, Hopeless 1 1 3  0 0 0 1  PHQ - 2 Score 2 2 5  0 0 0 2  Altered sleeping   3    2  Tired, decreased energy   2    1  Change in appetite   1    1  Feeling bad or failure about yourself    3    1  Trouble concentrating       1  Moving slowly or fidgety/restless   1    1  Suicidal thoughts   1    0  PHQ-9 Score   16    9  Difficult doing work/chores       Somewhat difficult    Review of Systems  Musculoskeletal:  Positive for back pain and neck pain.  All other systems reviewed and are negative.      Objective:   Physical Exam Vitals and nursing note reviewed.  Constitutional:      Appearance: Normal appearance.  Neck:     Comments: Cervical Paraspinal Tenderness: C-5-C-6 Cardiovascular:     Rate and Rhythm: Normal rate and regular rhythm.     Pulses: Normal pulses.     Heart sounds: Normal heart sounds.  Pulmonary:     Effort: Pulmonary effort is normal.     Breath sounds: Normal breath sounds.   Musculoskeletal:     Comments: Normal Muscle Bulk and Muscle Testing Reveals:  Upper Extremities: Full ROM and Muscle Strength 5/5 Bilateral AC Joint Tenderness  Lumbar Hypersensitivity Lower Extremities: Right: Decreased ROM and Muscle Strength 5/5 Right Lower Extremity Flexion Produces Pain into his Lumbar  Left Lower Extremity : Full ROM and Muscle Strength 5/5 Arises from Table with ease Narrow Based  Gait      Skin:    General: Skin is warm and dry.   Neurological:     Mental Status: She is alert and oriented to person, place, and time.   Psychiatric:        Mood and Affect: Mood normal.        Behavior: Behavior normal.         Assessment & Plan:  Cervicalgia: Continue HEP as Tolerated. Continue to alternate Heat and Ice Therapy. Continue to Monitor. 10/23/2023 Left Shoulder Tendonitis: No  Complaints today. Continue to Alternate Ice and Heat Therapy. Continue HEP  as Tolerated. Continue to Monitor. 10/23/2023 Chronic Bilateral Thoracic Back Pain: Continue HEP as Tolerated. Continue current medication regimen. Continue to Monitor. 10/23/2023 Lumbosacral Spondylosis without Myelopathy : S/P Right lumbar L2, L3, L4 medial branch blocks under fluoroscopic guidance: on 03/21/2021 .Continue to Monitor. 10/23/2023 Chronic Pain Syndrome:, Refilled:  Oxycodone  7.5mg  /325 one tablet every 4 hours a day as needed for pain #180. We will continue the opioid monitoring program, this consists of regular clinic visits, examinations, urine drug screen, pill counts as well as use of [place]  Controlled Substance Reporting system. A 12 month History has been reviewed on the [place]  Controlled Substance Reporting System on 10/23/2023  Myofacial Pain Syndrome:  Continue Flexeril . Continue to Monitor. 10/23/2023  F/U with Dr Alessandra Ancona

## 2023-10-23 NOTE — Telephone Encounter (Signed)
 Dr Alessandra Ancona see MS. [place] note from 10/23/2023

## 2023-10-28 LAB — TOXASSURE SELECT,+ANTIDEPR,UR

## 2023-11-19 ENCOUNTER — Other Ambulatory Visit: Payer: Self-pay | Admitting: Family Medicine

## 2023-11-19 ENCOUNTER — Other Ambulatory Visit (HOSPITAL_COMMUNITY): Payer: Self-pay

## 2023-11-19 ENCOUNTER — Other Ambulatory Visit: Payer: Self-pay | Admitting: Registered Nurse

## 2023-11-19 ENCOUNTER — Other Ambulatory Visit: Payer: Self-pay

## 2023-11-19 DIAGNOSIS — F411 Generalized anxiety disorder: Secondary | ICD-10-CM

## 2023-11-19 DIAGNOSIS — F3181 Bipolar II disorder: Secondary | ICD-10-CM

## 2023-11-19 MED ORDER — TRAZODONE HCL 100 MG PO TABS
200.0000 mg | ORAL_TABLET | Freq: Every day | ORAL | 0 refills | Status: DC
Start: 2023-11-19 — End: 2023-12-19
  Filled 2023-11-19: qty 180, 90d supply, fill #0

## 2023-11-20 ENCOUNTER — Telehealth: Payer: Self-pay | Admitting: Registered Nurse

## 2023-11-20 ENCOUNTER — Other Ambulatory Visit (HOSPITAL_COMMUNITY): Payer: Self-pay

## 2023-11-20 MED ORDER — OXYCODONE-ACETAMINOPHEN 7.5-325 MG PO TABS
1.0000 | ORAL_TABLET | ORAL | 0 refills | Status: DC | PRN
  Filled 2023-11-20 (×2): qty 180, 30d supply, fill #0

## 2023-11-20 NOTE — Telephone Encounter (Signed)
 PMP was Reviewed.  Oxycodone  e-scribed to pharmacy,  Call placed to Ms. Torosian, regarding the above. She verbalizes understanding.

## 2023-11-24 ENCOUNTER — Other Ambulatory Visit: Payer: Self-pay

## 2023-11-24 ENCOUNTER — Other Ambulatory Visit (HOSPITAL_COMMUNITY): Payer: Self-pay

## 2023-11-24 ENCOUNTER — Encounter: Attending: Physical Medicine and Rehabilitation | Admitting: Physical Medicine and Rehabilitation

## 2023-11-24 VITALS — BP 135/87 | Ht 64.0 in | Wt 189.0 lb

## 2023-11-24 DIAGNOSIS — G894 Chronic pain syndrome: Secondary | ICD-10-CM | POA: Diagnosis not present

## 2023-11-24 DIAGNOSIS — M7912 Myalgia of auxiliary muscles, head and neck: Secondary | ICD-10-CM

## 2023-11-24 MED ORDER — XTAMPZA ER 9 MG PO C12A
9.0000 mg | EXTENDED_RELEASE_CAPSULE | Freq: Two times a day (BID) | ORAL | 0 refills | Status: DC
  Filled 2023-11-24: qty 60, 30d supply, fill #0

## 2023-11-24 MED ORDER — LIDOCAINE HCL 1 % IJ SOLN
5.0000 mL | Freq: Once | INTRAMUSCULAR | Status: AC
  Administered 2023-11-24: 5 mL via INTRADERMAL

## 2023-11-24 MED ORDER — OXYCODONE-ACETAMINOPHEN 7.5-325 MG PO TABS
1.0000 | ORAL_TABLET | ORAL | 0 refills | Status: DC | PRN
  Filled 2023-11-24 – 2023-12-19 (×2): qty 180, 30d supply, fill #0

## 2023-11-24 NOTE — Progress Notes (Addendum)
 Trigger Point Injection  Indication: Cervical myofascial pain not relieved by medication management and other conservative care.  Informed consent was obtained after describing risk and benefits of the procedure with the patient, this includes bleeding, bruising, infection and medication side effects.  The patient wishes to proceed and has given written consent.  The patient was placed in a seated position.  The area of pain was marked and prepped with Betadine.  It was entered with a 25-gauge 1/2 inch needle and a total of 5 mL of 1% lidocaine  and normal saline was injected into a total of 4 trigger points, after negative draw back for blood.  The patient tolerated the procedure well.  Post procedure instructions were given.    Discussed that we have been asked to wean off IR opioids to q4H- will give her one more script of q4H and order Xtampza  long term - will need to see if insurance approves- if approves, will wean down to q6H next visit

## 2023-11-24 NOTE — Addendum Note (Signed)
 Addended by: LORILEE SVEN SQUIBB on: 11/24/2023 12:21 PM   Modules accepted: Orders

## 2023-11-25 ENCOUNTER — Other Ambulatory Visit (HOSPITAL_COMMUNITY): Payer: Self-pay

## 2023-11-26 ENCOUNTER — Other Ambulatory Visit: Payer: Self-pay

## 2023-12-19 ENCOUNTER — Other Ambulatory Visit: Payer: Self-pay | Admitting: Family Medicine

## 2023-12-19 ENCOUNTER — Other Ambulatory Visit (HOSPITAL_COMMUNITY): Payer: Self-pay

## 2023-12-19 DIAGNOSIS — E1169 Type 2 diabetes mellitus with other specified complication: Secondary | ICD-10-CM

## 2023-12-19 DIAGNOSIS — F411 Generalized anxiety disorder: Secondary | ICD-10-CM

## 2023-12-19 DIAGNOSIS — F3181 Bipolar II disorder: Secondary | ICD-10-CM

## 2023-12-19 DIAGNOSIS — I10 Essential (primary) hypertension: Secondary | ICD-10-CM

## 2023-12-21 ENCOUNTER — Other Ambulatory Visit: Payer: Self-pay

## 2023-12-21 ENCOUNTER — Other Ambulatory Visit (HOSPITAL_COMMUNITY): Payer: Self-pay

## 2023-12-21 MED ORDER — TRAZODONE HCL 100 MG PO TABS
200.0000 mg | ORAL_TABLET | Freq: Every day | ORAL | 0 refills | Status: AC
  Filled 2023-12-21 – 2024-02-15 (×3): qty 180, 90d supply, fill #0

## 2023-12-21 MED ORDER — ATORVASTATIN CALCIUM 40 MG PO TABS
40.0000 mg | ORAL_TABLET | Freq: Every day | ORAL | 1 refills | Status: DC
  Filled 2023-12-21: qty 90, 90d supply, fill #0

## 2023-12-21 MED ORDER — AMLODIPINE BESYLATE 5 MG PO TABS
5.0000 mg | ORAL_TABLET | Freq: Every day | ORAL | 0 refills | Status: DC
  Filled 2023-12-21: qty 30, 30d supply, fill #0

## 2023-12-28 ENCOUNTER — Other Ambulatory Visit (HOSPITAL_COMMUNITY): Payer: Self-pay

## 2023-12-28 ENCOUNTER — Other Ambulatory Visit: Payer: Self-pay

## 2023-12-28 ENCOUNTER — Encounter: Attending: Physical Medicine and Rehabilitation | Admitting: Physical Medicine and Rehabilitation

## 2023-12-28 VITALS — BP 147/90 | HR 85 | Ht 64.0 in | Wt 186.6 lb

## 2023-12-28 DIAGNOSIS — M7918 Myalgia, other site: Secondary | ICD-10-CM | POA: Diagnosis not present

## 2023-12-28 MED ORDER — OXYCODONE-ACETAMINOPHEN 7.5-325 MG PO TABS
1.0000 | ORAL_TABLET | Freq: Four times a day (QID) | ORAL | 0 refills | Status: DC | PRN
  Filled 2023-12-28 – 2024-01-18 (×4): qty 120, 30d supply, fill #0

## 2023-12-28 MED ORDER — XTAMPZA ER 9 MG PO C12A
9.0000 mg | EXTENDED_RELEASE_CAPSULE | Freq: Two times a day (BID) | ORAL | 0 refills | Status: DC
  Filled 2023-12-28 (×2): qty 60, 30d supply, fill #0

## 2023-12-28 MED ORDER — LIDOCAINE HCL 1 % IJ SOLN
5.0000 mL | Freq: Once | INTRAMUSCULAR | Status: AC
  Administered 2023-12-28: 5 mL

## 2023-12-28 MED ORDER — SODIUM CHLORIDE (PF) 0.9 % IJ SOLN: 2.0000 mL | Freq: Once | INTRAMUSCULAR | Status: DC

## 2023-12-28 NOTE — Addendum Note (Signed)
 Addended by: LORILEE SVEN SQUIBB on: 12/28/2023 01:26 PM   Modules accepted: Level of Service

## 2023-12-28 NOTE — Addendum Note (Signed)
 Addended by: GEORGINA BARI CROME on: 12/28/2023 01:47 PM   Modules accepted: Orders

## 2023-12-28 NOTE — Addendum Note (Signed)
 Addended by: LORILEE SVEN SQUIBB on: 12/28/2023 01:24 PM   Modules accepted: Orders

## 2023-12-28 NOTE — Addendum Note (Signed)
 Addended by: GEORGINA BARI CROME on: 12/28/2023 01:44 PM   Modules accepted: Orders

## 2023-12-28 NOTE — Progress Notes (Signed)
 Subjective:    Patient ID: Karen Whitehead, female    DOB: 06-May-1965, 58 y.o.   MRN: [id number]  HPI: Karen Whitehead is a 58 y.o. female who returns for follow up appointment for chronic pain and medication refill. She states her pain is located in her neck, mid- lower back and bilateral lower extremities with numbness. Karen Whitehead reports her pain is not being controlled in the last two weeks with her current medication regimen, she states there were a few  days when she took 1 1/2 tables, we discussed the narcotic policy, and it was violation for self- prescribing. She is aware if this occurs again can lead to discharge from our office. The above will be discussed with Dr Lorilee, no changes to her medication today. She was instructed to keep a Pain Journal and send information to DrRaulkar in two weeks, she verbalizes understanding. She is currently taking her medication every 4 hours, she is aware we will not be changing the frequency in her medication, she verbalizes understanding.  She will be mailed a warning letter regarding the above. She verbalizes understanding.   She rates her pain 5. Her current exercise regime is walking and performing stretching exercises.  Karen Whitehead Morphine equivalent is 67.50 MME.   UDS ordered today.   Lumbar facet arthropathy: -she would like to try medial branch block again -she feels that the oxycodone  is more helpful than the Xtampza      Pain Inventory Average Pain 8 Pain Right Now 5 My pain is constant, burning, and aching  In the last 24 hours, has pain interfered with the following? General activity 5 Relation with others 0 Enjoyment of life 0 What TIME of day is your pain at its worst? daytime, evening, and night Sleep (in general) Fair  Pain is worse with: walking, bending, sitting, and standing Pain improves with: rest, heat/ice, medication, and injections Relief from Meds: fair  Family History  Problem Relation Age of Onset   Stomach  cancer Mother    Heart attack Father    Breast cancer Neg Hx    Colon cancer Neg Hx    Colon polyps Neg Hx    Social History   Socioeconomic History   Marital status: Single    Spouse name: Not on file   Number of children: 1   Years of education: Not on file   Highest education level: Some college, no degree  Occupational History   Not on file  Tobacco Use   Smoking status: Every Day    Current packs/day: 0.50    Types: Cigarettes   Smokeless tobacco: Never  Vaping Use   Vaping status: Never Used  Substance and Sexual Activity   Alcohol use: No   Drug use: No   Sexual activity: Not Currently  Other Topics Concern   Not on file  Social History Narrative   Not on file   Social Drivers of Health   Financial Resource Strain: Not on file  Food Insecurity: No Food Insecurity (12/24/2022)   Hunger Vital Sign    Worried About Running Out of Food in the Last Year: Never true    Ran Out of Food in the Last Year: Never true  Transportation Needs: Unmet Transportation Needs (12/24/2022)   PRAPARE - Transportation    Lack of Transportation (Medical): Yes    Lack of Transportation (Non-Medical): Yes  Physical Activity: Inactive (01/14/2023)   Exercise Vital Sign    Days of Exercise per Week: 0  days    Minutes of Exercise per Session: 0 min  Stress: Stress Concern Present (01/14/2023)   [employer] of Occupational Health - Occupational Stress Questionnaire    Feeling of Stress : Rather much  Social Connections: Not on file   Past Surgical History:  Procedure Laterality Date   APPENDECTOMY     Past Surgical History:  Procedure Laterality Date   APPENDECTOMY     Past Medical History:  Diagnosis Date   Anxiety    Arthritis    Chronic kidney disease    Depression    Diabetes mellitus without complication (HCC)    Granuloma annulare    Hyperlipidemia    BP (!) 147/90 (BP Location: Left Arm, Patient Position: Sitting, Cuff Size: Large)   Pulse 85   Ht 5' 4  (1.626 m)   Wt 186 lb 9.6 oz (84.6 kg)   SpO2 98%   BMI 32.03 kg/m   Opioid Risk Score:   Fall Risk Score:  `1  Depression screen [facility] 2/9     10/23/2023   10:23 AM 09/24/2023   10:02 AM 07/27/2023   10:50 AM 07/07/2023    1:48 PM 06/30/2023    1:03 PM 05/21/2023   10:22 AM 02/17/2023   10:27 AM  Depression screen PHQ 2/9  Decreased Interest 1 1 1 2  0 0 0  Down, Depressed, Hopeless 1 1 1 3  0 0 0  PHQ - 2 Score 2 2 2 5  0 0 0  Altered sleeping    3     Tired, decreased energy    2     Change in appetite    1     Feeling bad or failure about yourself     3     Moving slowly or fidgety/restless    1     Suicidal thoughts    1     PHQ-9 Score    16       Review of Systems  Musculoskeletal:  Positive for back pain and neck pain.  All other systems reviewed and are negative.      Objective:   Physical Exam Vitals and nursing note reviewed.  Constitutional:      Appearance: Normal appearance.  Neck:     Comments: Cervical Paraspinal Tenderness: C-5-C-6 Cardiovascular:     Rate and Rhythm: Normal rate and regular rhythm.     Pulses: Normal pulses.     Heart sounds: Normal heart sounds.  Pulmonary:     Effort: Pulmonary effort is normal.     Breath sounds: Normal breath sounds.  Musculoskeletal:     Comments: Normal Muscle Bulk and Muscle Testing Reveals:  Upper Extremities: Full ROM and Muscle Strength 5/5 Bilateral AC Joint Tenderness  Lumbar Hypersensitivity Lower Extremities: Right: Decreased ROM and Muscle Strength 5/5 Right Lower Extremity Flexion Produces Pain into his Lumbar  Left Lower Extremity : Full ROM and Muscle Strength 5/5 Arises from Table with ease Narrow Based  Gait     Skin:    General: Skin is warm and dry.  Neurological:     Mental Status: She is alert and oriented to person, place, and time.  Psychiatric:        Mood and Affect: Mood normal.        Behavior: Behavior normal.   Stable 12/28/23       Assessment & Plan:  Cervicalgia:  Continue HEP as Tolerated. Continue to alternate Heat and Ice Therapy. Continue to Monitor. 10/23/2023 Left Shoulder  Tendonitis: No Complaints today. Continue to Alternate Ice and Heat Therapy. Continue HEP as Tolerated. Continue to Monitor. 10/23/2023 Chronic Bilateral Thoracic Back Pain: Continue HEP as Tolerated. Continue current medication regimen. Continue to Monitor. 10/23/2023 Lumbosacral Spondylosis without Myelopathy : S/P Right lumbar L2, L3, L4 medial branch blocks under fluoroscopic guidance: on 03/21/2021 .Continue to Monitor. 10/23/2023 Chronic Pain Syndrome 2/2 lumbar facet arthropathy:  We will continue the opioid monitoring program, this consists of regular clinic visits, examinations, urine drug screen, pill counts as well as use of [place]  Controlled Substance Reporting system. A 12 month History has been reviewed on the   Controlled Substance Reporting System on 10/23/2023 Discussed risks and benefits of celebrex Discussed risks and benefits of medial branch blocks, referred to Dr. Lucius for repeat medial branch block right L3/L4 Refilled perocet 1 tab q6H prn Refilled Xtampza  1 tab q12H  Myofacial Pain Syndrome:  Continue Flexeril . Continue to Monitor. 10/23/2023, see separate for trigger point injections  F/U with Dr Lorilee

## 2023-12-28 NOTE — Progress Notes (Signed)
Trigger Point Injection  Indication: Lumbar myofascial pain not relieved by medication management and other conservative care.  Informed consent was obtained after describing risk and benefits of the procedure with the patient, this includes bleeding, bruising, infection and medication side effects.  The patient wishes to proceed and has given written consent.  The patient was placed in a seated position.  The area of pain was marked and prepped with Betadine.  It was entered with a 25-gauge 1/2 inch needle and a total of 5 mL of 1% lidocaine and normal saline was injected into a total of 4 trigger points, after negative draw back for blood.  The patient tolerated the procedure well.  Post procedure instructions were given.     

## 2023-12-29 ENCOUNTER — Other Ambulatory Visit: Payer: Self-pay

## 2023-12-30 ENCOUNTER — Other Ambulatory Visit: Payer: Self-pay

## 2024-01-06 ENCOUNTER — Telehealth: Payer: Self-pay | Admitting: Family Medicine

## 2024-01-06 NOTE — Telephone Encounter (Signed)
 Pt confirmed appt 9/2 (per vr)

## 2024-01-07 ENCOUNTER — Ambulatory Visit: Attending: Family Medicine | Admitting: Family Medicine

## 2024-01-07 ENCOUNTER — Other Ambulatory Visit: Payer: Self-pay

## 2024-01-07 ENCOUNTER — Encounter: Payer: Self-pay | Admitting: Family Medicine

## 2024-01-07 ENCOUNTER — Other Ambulatory Visit (HOSPITAL_COMMUNITY): Payer: Self-pay

## 2024-01-07 VITALS — BP 136/89 | HR 77 | Ht 64.0 in | Wt 188.0 lb

## 2024-01-07 DIAGNOSIS — F3181 Bipolar II disorder: Secondary | ICD-10-CM | POA: Diagnosis not present

## 2024-01-07 DIAGNOSIS — F17219 Nicotine dependence, cigarettes, with unspecified nicotine-induced disorders: Secondary | ICD-10-CM

## 2024-01-07 DIAGNOSIS — I152 Hypertension secondary to endocrine disorders: Secondary | ICD-10-CM

## 2024-01-07 DIAGNOSIS — E1159 Type 2 diabetes mellitus with other circulatory complications: Secondary | ICD-10-CM

## 2024-01-07 DIAGNOSIS — E1169 Type 2 diabetes mellitus with other specified complication: Secondary | ICD-10-CM

## 2024-01-07 DIAGNOSIS — F411 Generalized anxiety disorder: Secondary | ICD-10-CM

## 2024-01-07 DIAGNOSIS — G894 Chronic pain syndrome: Secondary | ICD-10-CM

## 2024-01-07 DIAGNOSIS — E785 Hyperlipidemia, unspecified: Secondary | ICD-10-CM

## 2024-01-07 LAB — POCT GLYCOSYLATED HEMOGLOBIN (HGB A1C): HbA1c, POC (controlled diabetic range): 5.8 % (ref 0.0–7.0)

## 2024-01-07 MED ORDER — VARENICLINE TARTRATE 1 MG PO TABS
1.0000 mg | ORAL_TABLET | Freq: Two times a day (BID) | ORAL | 2 refills | Status: AC
  Filled 2024-01-07: qty 60, 30d supply, fill #0

## 2024-01-07 MED ORDER — SEMAGLUTIDE (2 MG/DOSE) 8 MG/3ML ~~LOC~~ SOPN
2.0000 mg | PEN_INJECTOR | SUBCUTANEOUS | 1 refills | Status: DC
  Filled 2024-01-07: qty 3, 28d supply, fill #0
  Filled 2024-02-15: qty 3, 28d supply, fill #1

## 2024-01-07 MED ORDER — AMLODIPINE BESYLATE 5 MG PO TABS
5.0000 mg | ORAL_TABLET | Freq: Every day | ORAL | 1 refills | Status: AC
  Filled 2024-01-07 – 2024-02-15 (×3): qty 90, 90d supply, fill #0

## 2024-01-07 MED ORDER — SERTRALINE HCL 100 MG PO TABS
200.0000 mg | ORAL_TABLET | Freq: Every day | ORAL | 0 refills | Status: AC
  Filled 2024-01-07 – 2024-01-18 (×3): qty 180, 90d supply, fill #0

## 2024-01-07 MED ORDER — ATORVASTATIN CALCIUM 40 MG PO TABS
40.0000 mg | ORAL_TABLET | Freq: Every day | ORAL | 1 refills | Status: DC
  Filled 2024-01-07: qty 90, 90d supply, fill #0

## 2024-01-07 NOTE — Patient Instructions (Addendum)
 Smoking cessation support: smoking cessation hotline: 1-800-QUIT-NOW.  Smoking cessation classes are available through [facility]. Call [phone] or visit our website at [email or web address].    VISIT SUMMARY:  During today's visit, we discussed your ongoing mental health challenges, medication management, and other health concerns. We addressed your difficulties in accessing psychiatric care and managing your medications, and we reviewed your progress with diabetes, hypertension, and smoking cessation. We also talked about the importance of resuming water therapy for chronic pain and maintaining general health.  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 We will refill your Zoloft  prescription and encourage you to schedule a mental health appointment, possibly using paid transportation for the first visit to enable future video visits. It's important to manage your mental health and adhere to your medication.  -TYPE 2 DIABETES MELLITUS: This is a condition where your body has difficulty regulating blood sugar levels. Your diabetes is well controlled with an A1c of 5.8. We will increase your Ozempic  dose to 2 mg weekly and continue monitoring your blood glucose levels.  -HYPERTENSION: This is high blood pressure. We will refill your blood pressure medication to help manage this condition.  -HYPERLIPIDEMIA: This is high cholesterol. We will refill your atorvastatin  prescription to help control your cholesterol levels.  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 You have significantly reduced your smoking. We will refill your Chantix  prescription and provide information on the 1-800-QUIT-NOW hotline for additional support and access to nicotine  patches.  -CHRONIC PAIN: This is ongoing pain that persists over  a long period. You benefit from water therapy but have not been attending due to mental health struggles. We encourage you to resume water therapy sessions.  -GENERAL HEALTH MAINTENANCE: You are due for a pneumonia vaccine but have been advised against it due to your history of Legionnaire's disease. We will research contraindications for pneumonia vaccines in patients with a history of Legionnaire's disease.  INSTRUCTIONS:  Please follow up on scheduling a mental health appointment and consider using paid transportation for the first visit. Continue monitoring your blood glucose levels and take your medications as prescribed. We will order a urine specimen to check your kidney function at your next visit and place a referral for an eye exam.

## 2024-01-07 NOTE — Progress Notes (Signed)
 Subjective:  Patient ID: Karen Whitehead, female    DOB: April 30, 1966  Age: 58 y.o. MRN: [id number]  CC: Medical Management of Chronic Issues     Discussed the use of AI scribe software for clinical note transcription with the patient, who gave verbal consent to proceed.  History of Present Illness Karen Whitehead is a 58 year old female  with a history of Type2 DM (A1c 6.2), anxiety and depression (managed by behavioral health), insomnia, Granuloma Annulare, cervical radiculopathy, neck low back pain.   who presents with difficulty accessing psychiatric care and medication management.  Yasmin experiences significant mental health struggles, feeling that her brain is not functioning properly.  She faces transportation issues preventing her from attending psych visit and counseling sessions at the behavioral health center. She was previously on Risperdal  but has been unable to continue due to access issues. Attempts to connect via video call for sessions have failed due to technical difficulties.  She is currently taking Zoloft  but has issues with refills, receiving only one bottle despite a prescription for a 90-day supply with a refill. She takes Trazodone  for sleep but is trying to avoid it due to dependency concerns. She acknowledges the need to address her mental health issues.  Her most recent A1c is 5.8, and she has lost weight, now weighing 188 pounds, attributed to dietary changes and Ozempic . She is also on atorvastatin  for cholesterol management.  She has reduced smoking from two packs a day to about seven cigarettes daily with the help of Chantix , though it has been emotionally challenging.  She has a history of Legionnaire's disease with a collapsed left lung and was advised against pneumonia vaccines per patient. She experienced a fall resulting in a bruise and cut, attributed to balance issues.  She has not attended physical therapy sessions, specifically water therapy, which she  found beneficial for chronic pain, and wants to resume but struggles with motivation. Her chronic pain is managed by physical medicine and rehab.  Past Medical History:  Diagnosis Date   Anxiety    Arthritis    Chronic kidney disease    Depression    Diabetes mellitus without complication (HCC)    Granuloma annulare    Hyperlipidemia     Past Surgical History:  Procedure Laterality Date   APPENDECTOMY      Family History  Problem Relation Age of Onset   Stomach cancer Mother    Heart attack Father    Breast cancer Neg Hx    Colon cancer Neg Hx    Colon polyps Neg Hx     Social History   Socioeconomic History   Marital status: Single    Spouse name: Not on file   Number of children: 1   Years of education: Not on file   Highest education level: Some college, no degree  Occupational History   Not on file  Tobacco Use   Smoking status: Every Day    Current packs/day: 0.50    Types: Cigarettes   Smokeless tobacco: Never  Vaping Use   Vaping status: Never Used  Substance and Sexual Activity   Alcohol use: No   Drug use: No   Sexual activity: Not Currently  Other Topics Concern   Not on file  Social History Narrative   Not on file   Social Drivers of Health   Financial Resource Strain: Not on file  Food Insecurity: No Food Insecurity (12/24/2022)   Hunger Vital Sign    Worried  About Running Out of Food in the Last Year: Never true    Ran Out of Food in the Last Year: Never true  Transportation Needs: Unmet Transportation Needs (12/24/2022)   PRAPARE - Transportation    Lack of Transportation (Medical): Yes    Lack of Transportation (Non-Medical): Yes  Physical Activity: Inactive (01/14/2023)   Exercise Vital Sign    Days of Exercise per Week: 0 days    Minutes of Exercise per Session: 0 min  Stress: Stress Concern Present (01/14/2023)   [employer] of Occupational Health - Occupational Stress Questionnaire    Feeling of Stress : Rather much  Social  Connections: Not on file    No Known Allergies  Outpatient Medications Prior to Visit  Medication Sig Dispense Refill   albuterol  (VENTOLIN  HFA) 108 (90 Base) MCG/ACT inhaler INHALE 1-2 PUFFS INTO THE LUNGS EVERY 6 (SIX) HOURS AS NEEDED FOR WHEEZING OR SHORTNESS OF BREATH. 18 g 1   amitriptyline  (ELAVIL ) 10 MG tablet Take 1 tablet (10 mg total) by mouth at bedtime. 30 tablet 1   Blood Glucose Monitoring Suppl (BLOOD GLUCOSE MONITOR SYSTEM) w/Device KIT Use to check blood sugar in the morning and at bedtime. 1 kit 0   Blood Glucose Monitoring Suppl (ONETOUCH VERIO) w/Device KIT Use to check blood sugar in the morning and at bedtime 1 kit 0   clonazePAM  (KLONOPIN ) 0.5 MG tablet Take 1 tablet (0.5 mg total) by mouth 2 (two) times daily as needed. for anxiety 60 tablet 2   cyclobenzaprine  (FLEXERIL ) 10 MG tablet Take 1 tablet (10 mg total) by mouth 2 (two) times daily as needed for muscle spasms as needed for muscle pain 60 tablet 6   divalproex  (DEPAKOTE  ER) 500 MG 24 hr tablet Take 1 tablet (500 mg total) by mouth at bedtime. 30 tablet 6   fluticasone  (CUTIVATE ) 0.005 % ointment Apply topically 2 times daily. 30 g 0   gabapentin  (NEURONTIN ) 300 MG capsule Take 1 capsule (300 mg total) by mouth 3 (three) times daily. 90 capsule 6   glucose blood (ONETOUCH VERIO) test strip Use to check blood sugar in the morning and at bedtime. 100 each 6   ibuprofen (ADVIL) 200 MG tablet Take 200 mg by mouth every 6 (six) hours as needed for headache, mild pain or moderate pain.     Lancets (ONETOUCH DELICA PLUS LANCET33G) MISC Use to check blood sugar in the morning and at bedtime. 100 each 6   lidocaine  (LIDODERM ) 5 % Place 2 patches onto the skin daily. Remove & Discard patch within 12 hours or as directed by MD 60 patch 11   nicotine  (NICODERM CQ ) 7 mg/24hr patch Place 1 patch (7 mg total) onto the skin daily. 28 patch 1   ondansetron  (ZOFRAN -ODT) 4 MG disintegrating tablet Take 1 tablet (4 mg total) by mouth  every 8 (eight) hours as needed for nausea or vomiting. 20 tablet 0   oxyCODONE  ER (XTAMPZA  ER) 9 MG C12A Take 1 capsule by mouth every 12 (twelve) hours. 60 capsule 0   oxyCODONE -acetaminophen  (PERCOCET) 7.5-325 MG tablet Take 1 tablet by mouth every 6 (six) hours as needed for severe pain (pain score 7-10). 120 tablet 0   risperiDONE  (RISPERDAL ) 1 MG tablet Take 1 tablet (1 mg total) by mouth 2 (two) times daily. 60 tablet 3   SUMAtriptan  (IMITREX ) 50 MG tablet Take 1 tablet (50 mg total) by mouth every 2 (two) hours as needed for migraine. May repeat in 2 hours if  headache persists or recurs. 12 tablet 6   Suzetrigine  (JOURNAVX ) 50 MG TABS Take 1 tablet by mouth 2 (two) times daily as needed. 60 tablet 0   topiramate  (TOPAMAX ) 50 MG tablet Take 1 tablet (50 mg total) by mouth 2 (two) times daily. 60 tablet 3   traZODone  (DESYREL ) 100 MG tablet Take 2 tablets (200 mg total) by mouth at bedtime. 180 tablet 0   Vitamin D , Ergocalciferol , (DRISDOL ) 1.25 MG (50000 UNIT) CAPS capsule Take 1 capsule (50,000 Units total) by mouth every 7 (seven) days. 7 capsule 0   amLODipine  (NORVASC ) 5 MG tablet Take 1 tablet (5 mg total) by mouth daily. 30 tablet 0   atorvastatin  (LIPITOR) 40 MG tablet Take 1 tablet (40 mg total) by mouth daily. discontinue 20mg  90 tablet 1   Semaglutide , 1 MG/DOSE, (OZEMPIC , 1 MG/DOSE,) 4 MG/3ML SOPN Inject 1 mg as directed once a week. 3 mL 6   sertraline  (ZOLOFT ) 100 MG tablet Take 2 tablets (200 mg total) by mouth daily. 180 tablet 1   varenicline  (CHANTIX  CONTINUING MONTH PAK) 1 MG tablet Take 1 tablet (1 mg total) by mouth 2 (two) times daily. After completion of starter pack 60 tablet 2   Varenicline  Tartrate, Starter, (CHANTIX  STARTING MONTH PAK) 0.5 MG X 11 & 1 MG X 42 TBPK Take as directed per pack instructions. 53 each 0   Facility-Administered Medications Prior to Visit  Medication Dose Route Frequency Provider Last Rate Last Admin   lidocaine  (XYLOCAINE ) 1 % (with pres)  injection 3 mL  3 mL Intradermal Once Raulkar, Krutika P, MD       lidocaine  (XYLOCAINE ) 1 % (with pres) injection 3 mL  3 mL Other Once Raulkar, Krutika P, MD       lidocaine  (XYLOCAINE ) 1 % (with pres) injection 5 mL  5 mL Intradermal Once Raulkar, Sven SQUIBB, MD       sodium chloride  (PF) 0.9 % injection 2 mL  2 mL Intravenous Daily Raulkar, Krutika P, MD   2 mL at 07/24/22 1138   sodium chloride  (PF) 0.9 % injection 2 mL  2 mL Intravenous PRN        sodium chloride  (PF) 0.9 % injection 2 mL  2 mL Intravenous PRN Raulkar, Sven SQUIBB, MD         ROS Review of Systems  Constitutional:  Negative for activity change and appetite change.  HENT:  Negative for sinus pressure and sore throat.   Respiratory:  Negative for chest tightness, shortness of breath and wheezing.   Cardiovascular:  Negative for chest pain and palpitations.  Gastrointestinal:  Negative for abdominal distention, abdominal pain and constipation.  Genitourinary: Negative.   Musculoskeletal:  Positive for arthralgias.  Psychiatric/Behavioral:  Positive for dysphoric mood. Negative for behavioral problems.     Objective:  BP 136/89   Pulse 77   Ht 5' 4 (1.626 m)   Wt 188 lb (85.3 kg)   SpO2 98%   BMI 32.27 kg/m      01/07/2024   11:12 AM 12/28/2023    1:02 PM 11/24/2023   11:57 AM  BP/Weight  Systolic BP 136 147 135  Diastolic BP 89 90 87  Wt. (Lbs) 188 186.6 189  BMI 32.27 kg/m2 32.03 kg/m2 32.44 kg/m2    Wt Readings from Last 3 Encounters:  01/07/24 188 lb (85.3 kg)  12/28/23 186 lb 9.6 oz (84.6 kg)  11/24/23 189 lb (85.7 kg)      Physical Exam Constitutional:  Appearance: She is well-developed.  Cardiovascular:     Rate and Rhythm: Normal rate.     Heart sounds: Normal heart sounds. No murmur heard. Pulmonary:     Effort: Pulmonary effort is normal.     Breath sounds: Normal breath sounds. No wheezing or rales.  Chest:     Chest wall: No tenderness.  Abdominal:     General: Bowel sounds  are normal. There is no distension.     Palpations: Abdomen is soft. There is no mass.     Tenderness: There is no abdominal tenderness.  Musculoskeletal:        General: Normal range of motion.     Right lower leg: No edema.     Left lower leg: No edema.  Skin:    Comments: Bruise on lower half of right leg which is healing  Neurological:     Mental Status: She is alert and oriented to person, place, and time.  Psychiatric:     Comments: Dysphoric mood        Latest Ref Rng & Units 07/07/2023    2:36 PM 12/17/2022    4:43 PM 11/26/2022    3:27 PM  CMP  Glucose 70 - 99 mg/dL 94  889  94   BUN 6 - 24 mg/dL 7  5  11    Creatinine 0.57 - 1.00 mg/dL 9.23  9.16  9.04   Sodium 134 - 144 mmol/L 141  138  138   Potassium 3.5 - 5.2 mmol/L 4.0  2.8  4.5   Chloride 96 - 106 mmol/L 101  97  98   CO2 20 - 29 mmol/L 27  29  25    Calcium  8.7 - 10.2 mg/dL 9.9  9.3  9.5   Total Protein 6.5 - 8.1 g/dL  7.6  7.4   Total Bilirubin 0.3 - 1.2 mg/dL  0.9  0.3   Alkaline Phos 38 - 126 U/L  44  67   AST 15 - 41 U/L  22  31   ALT 0 - 44 U/L  27  40     Lipid Panel     Component Value Date/Time   CHOL 162 10/31/2021 1202   TRIG 101 10/31/2021 1202   HDL 49 10/31/2021 1202   CHOLHDL 6.0 (H) 10/31/2020 1004   LDLCALC 94 10/31/2021 1202    CBC    Component Value Date/Time   WBC 10.0 12/17/2022 1643   RBC 5.12 (H) 12/17/2022 1643   HGB 15.4 (H) 12/17/2022 1643   HCT 44.5 12/17/2022 1643   PLT 290 12/17/2022 1643   MCV 86.9 12/17/2022 1643   MCH 30.1 12/17/2022 1643   MCHC 34.6 12/17/2022 1643   RDW 12.4 12/17/2022 1643   LYMPHSABS 3.0 11/10/2018 1315   MONOABS 0.6 11/10/2018 1315   EOSABS 0.3 11/10/2018 1315   BASOSABS 0.1 11/10/2018 1315    Lab Results  Component Value Date   HGBA1C 5.8 01/07/2024    Lab Results  Component Value Date   HGBA1C 5.8 01/07/2024   HGBA1C 5.7 07/07/2023   HGBA1C 6.2 11/26/2022      Assessment and Plan Assessment & Plan Bipolar II disorder with  comorbid major depressive disorder/generalized anxiety disorder Experiencing uncontrolled symptoms due to medication non-adherence and lack of psychiatric follow-up. - Refill Zoloft  prescription. - Encourage scheduling a mental health appointment.  We discussed this at her last visit 6 months ago but she has made no effort to do so. - Discussed the importance of  mental health management and medication adherence and keeping psychiatry appointment to ensure optimization of her bipolar disorder.  Type 2 diabetes mellitus with other specified complication Diabetes well controlled with A1c of 5.8. Effective diet management and weight loss achieved. - Increase Ozempic  dose to 2 mg weekly due to desire for additional weight loss - Continue monitoring blood glucose levels.  Hypertension associated with type 2 diabetes mellitus - Refill blood pressure medication. -Counseled on blood pressure goal of less than 130/80, low-sodium, DASH diet, medication compliance, 150 minutes of moderate intensity exercise per week. Discussed medication compliance, adverse effects.  Hyperlipidemia associated with type 2 diabetes mellitus Hyperlipidemia management not discussed but on atorvastatin  for control. - Refill atorvastatin  prescription.  Nicotine  dependence, cigarettes Reduced smoking significantly. Concerns about emotional effects of reduced smoking. Discussed smoking risks. - Refill Chantix  prescription. - Provide information on the 1-800-QUIT-NOW hotline for smoking cessation support and access to nicotine  patches.  Chronic pain Benefits from water therapy but not attending due to mental health struggles. - Encourage resuming water therapy sessions. - Continue to follow-up with physical medicine and rehab   Follow-Up Needs follow-up on mental health and eye exams. - Order urine microalbumin - Place a referral for an eye exam.    Healthcare maintenance Declines pneumonia vaccine as she states  she was informed that due to history of legionnaires disease pneumonia vaccine is contraindicated for her.  Meds ordered this encounter  Medications   Semaglutide , 2 MG/DOSE, 8 MG/3ML SOPN    Sig: Inject 2 mg as directed once a week.    Dispense:  3 mL    Refill:  1   amLODipine  (NORVASC ) 5 MG tablet    Sig: Take 1 tablet (5 mg total) by mouth daily.    Dispense:  90 tablet    Refill:  1   atorvastatin  (LIPITOR) 40 MG tablet    Sig: Take 1 tablet (40 mg total) by mouth daily. discontinue 20mg     Dispense:  90 tablet    Refill:  1    Discontinue 20 mg   sertraline  (ZOLOFT ) 100 MG tablet    Sig: Take 2 tablets (200 mg total) by mouth daily.    Dispense:  180 tablet    Refill:  0    Subsequent refills to come from psychiatry   varenicline  (CHANTIX  CONTINUING MONTH PAK) 1 MG tablet    Sig: Take 1 tablet (1 mg total) by mouth 2 (two) times daily. After completion of starter pack    Dispense:  60 tablet    Refill:  2    Follow-up: Return in about 6 months (around 07/06/2024) for Chronic medical conditions.       Corrina Sabin, MD, FAAFP. [facility] [place], [place] [phone]   01/07/2024, 12:09 PM

## 2024-01-08 ENCOUNTER — Ambulatory Visit: Payer: Self-pay | Admitting: Family Medicine

## 2024-01-08 ENCOUNTER — Other Ambulatory Visit: Payer: Self-pay

## 2024-01-08 DIAGNOSIS — E1169 Type 2 diabetes mellitus with other specified complication: Secondary | ICD-10-CM

## 2024-01-08 LAB — CMP14+EGFR
ALT: 17 IU/L (ref 0–32)
AST: 15 IU/L (ref 0–40)
Albumin: 4.2 g/dL (ref 3.8–4.9)
Alkaline Phosphatase: 56 IU/L (ref 44–121)
BUN/Creatinine Ratio: 10 (ref 9–23)
BUN: 8 mg/dL (ref 6–24)
Bilirubin Total: 0.3 mg/dL (ref 0.0–1.2)
CO2: 24 mmol/L (ref 20–29)
Calcium: 9.6 mg/dL (ref 8.7–10.2)
Chloride: 99 mmol/L (ref 96–106)
Creatinine, Ser: 0.82 mg/dL (ref 0.57–1.00)
Globulin, Total: 2.7 g/dL (ref 1.5–4.5)
Glucose: 112 mg/dL — ABNORMAL HIGH (ref 70–99)
Potassium: 4.3 mmol/L (ref 3.5–5.2)
Sodium: 138 mmol/L (ref 134–144)
Total Protein: 6.9 g/dL (ref 6.0–8.5)
eGFR: 83 mL/min/1.73 (ref >59)

## 2024-01-08 LAB — MICROALBUMIN / CREATININE URINE RATIO
Creatinine, Urine: 66 mg/dL
Microalb/Creat Ratio: 16 mg/g{creat} (ref 0–29)
Microalbumin, Urine: 10.5 ug/mL

## 2024-01-08 LAB — LP+NON-HDL CHOLESTEROL
Cholesterol, Total: 248 mg/dL — ABNORMAL HIGH (ref 100–199)
HDL: 41 mg/dL (ref >39)
LDL Chol Calc (NIH): 176 mg/dL — ABNORMAL HIGH (ref 0–99)
Total Non-HDL-Chol (LDL+VLDL): 207 mg/dL — ABNORMAL HIGH (ref 0–129)
Triglycerides: 166 mg/dL — ABNORMAL HIGH (ref 0–149)
VLDL Cholesterol Cal: 31 mg/dL (ref 5–40)

## 2024-01-08 MED ORDER — ATORVASTATIN CALCIUM 80 MG PO TABS
80.0000 mg | ORAL_TABLET | Freq: Every day | ORAL | 1 refills | Status: AC
  Filled 2024-01-08 – 2024-02-15 (×4): qty 90, 90d supply, fill #0

## 2024-01-16 ENCOUNTER — Other Ambulatory Visit (HOSPITAL_COMMUNITY): Payer: Self-pay

## 2024-01-18 ENCOUNTER — Other Ambulatory Visit (HOSPITAL_COMMUNITY): Payer: Self-pay

## 2024-01-28 ENCOUNTER — Encounter: Payer: Self-pay | Admitting: Physical Medicine and Rehabilitation

## 2024-01-28 ENCOUNTER — Other Ambulatory Visit: Payer: Self-pay

## 2024-01-28 ENCOUNTER — Encounter: Attending: Physical Medicine and Rehabilitation | Admitting: Physical Medicine and Rehabilitation

## 2024-01-28 ENCOUNTER — Other Ambulatory Visit (HOSPITAL_COMMUNITY): Payer: Self-pay

## 2024-01-28 VITALS — BP 138/89 | HR 80 | Ht 64.0 in | Wt 184.0 lb

## 2024-01-28 DIAGNOSIS — M47816 Spondylosis without myelopathy or radiculopathy, lumbar region: Secondary | ICD-10-CM | POA: Diagnosis not present

## 2024-01-28 DIAGNOSIS — M7918 Myalgia, other site: Secondary | ICD-10-CM | POA: Insufficient documentation

## 2024-01-28 MED ORDER — LIDOCAINE HCL 1 % IJ SOLN: 5.0000 mL | Freq: Once | INTRAMUSCULAR | Status: DC

## 2024-01-28 MED ORDER — OXYCODONE-ACETAMINOPHEN 7.5-325 MG PO TABS
1.0000 | ORAL_TABLET | ORAL | 0 refills | Status: DC | PRN
  Filled 2024-01-28 – 2024-02-15 (×3): qty 120, 20d supply, fill #0

## 2024-01-28 NOTE — Addendum Note (Signed)
 Addended by: Ellyn Rubiano M on: 01/28/2024 01:22 PM   Modules accepted: Orders

## 2024-01-28 NOTE — Progress Notes (Signed)
 Subjective:    Patient ID: Karen Whitehead, female    DOB: 11/17/1965, 58 y.o.   MRN: [id number]  HPI: Karen Whitehead is a 58 y.o. female who returns for follow up appointment for chronic pain and medication refill. She states her pain is located in her neck, mid- lower back and bilateral lower extremities with numbness. [name] reports her pain is not being controlled in the last two weeks with her current medication regimen, she states there were a few  days when she took 1 1/2 tables, we discussed the narcotic policy, and it was violation for self- prescribing. She is aware if this occurs again can lead to discharge from our office. The above will be discussed with Dr Lorilee, no changes to her medication today. She was instructed to keep a Pain Journal and send information to DrRaulkar in two weeks, she verbalizes understanding. She is currently taking her medication every 4 hours, she is aware we will not be changing the frequency in her medication, she verbalizes understanding.  She will be mailed a warning letter regarding the above. She verbalizes understanding.   She rates her pain 5. Her current exercise regime is walking and performing stretching exercises.  Ms. Estrella Morphine equivalent is 67.50 MME.   UDS ordered today.   1) Lumbar facet arthropathy: -she would like to try medial branch block again -she feels that the oxycodone  is more helpful than the Xtampza  -did not take her medicine for days and she got sick from withdrawal -Qutenza  did not help  -amitriptyline  did not help  2) Myalgia: -trigger point injections  -she is concerned that trigger point injections are no longer helping  3) Depression: -Zoloft  is helping   Pain Inventory Average Pain 8 Pain Right Now 5 My pain is constant, burning, and aching  In the last 24 hours, has pain interfered with the following? General activity 5 Relation with others 0 Enjoyment of life 0 What TIME of day is your pain at  its worst? daytime, evening, and night Sleep (in general) Fair  Pain is worse with: walking, bending, sitting, and standing Pain improves with: rest, heat/ice, medication, and injections Relief from Meds: fair  Family History  Problem Relation Age of Onset   Stomach cancer Mother    Heart attack Father    Breast cancer Neg Hx    Colon cancer Neg Hx    Colon polyps Neg Hx    Social History   Socioeconomic History   Marital status: Single    Spouse name: Not on file   Number of children: 1   Years of education: Not on file   Highest education level: Some college, no degree  Occupational History   Not on file  Tobacco Use   Smoking status: Every Day    Current packs/day: 0.50    Types: Cigarettes   Smokeless tobacco: Never  Vaping Use   Vaping status: Never Used  Substance and Sexual Activity   Alcohol use: No   Drug use: No   Sexual activity: Not Currently  Other Topics Concern   Not on file  Social History Narrative   Not on file   Social Drivers of Health   Financial Resource Strain: Not on file  Food Insecurity: No Food Insecurity (12/24/2022)   Hunger Vital Sign    Worried About Running Out of Food in the Last Year: Never true    Ran Out of Food in the Last Year: Never true  Transportation  Needs: Unmet Transportation Needs (12/24/2022)   PRAPARE - Transportation    Lack of Transportation (Medical): Yes    Lack of Transportation (Non-Medical): Yes  Physical Activity: Inactive (01/14/2023)   Exercise Vital Sign    Days of Exercise per Week: 0 days    Minutes of Exercise per Session: 0 min  Stress: Stress Concern Present (01/14/2023)   [employer] of Occupational Health - Occupational Stress Questionnaire    Feeling of Stress : Rather much  Social Connections: Not on file   Past Surgical History:  Procedure Laterality Date   APPENDECTOMY     Past Surgical History:  Procedure Laterality Date   APPENDECTOMY     Past Medical History:  Diagnosis Date    Anxiety    Arthritis    Chronic kidney disease    Depression    Diabetes mellitus without complication (HCC)    Granuloma annulare    Hyperlipidemia    BP 138/89   Pulse 80   Ht 5' 4 (1.626 m)   Wt 184 lb (83.5 kg)   SpO2 99%   BMI 31.58 kg/m   Opioid Risk Score:   Fall Risk Score:  `1  Depression screen [facility] 2/9     01/28/2024   10:35 AM 01/07/2024   11:13 AM 10/23/2023   10:23 AM 09/24/2023   10:02 AM 07/27/2023   10:50 AM 07/07/2023    1:48 PM 06/30/2023    1:03 PM  Depression screen PHQ 2/9  Decreased Interest 1 3 1 1 1 2  0  Down, Depressed, Hopeless  3 1 1 1 3  0  PHQ - 2 Score 1 6 2 2 2 5  0  Altered sleeping  3    3   Tired, decreased energy  3    2   Change in appetite  2    1   Feeling bad or failure about yourself   3    3   Trouble concentrating  3       Moving slowly or fidgety/restless      1   Suicidal thoughts  2    1   PHQ-9 Score  22    16     Review of Systems  Musculoskeletal:  Positive for back pain and neck pain.  All other systems reviewed and are negative.      Objective:   Physical Exam Vitals and nursing note reviewed.  Constitutional:      Appearance: Normal appearance.  Neck:     Comments: Cervical Paraspinal Tenderness: C-5-C-6 Cardiovascular:     Rate and Rhythm: Normal rate and regular rhythm.     Pulses: Normal pulses.     Heart sounds: Normal heart sounds.  Pulmonary:     Effort: Pulmonary effort is normal.     Breath sounds: Normal breath sounds.  Musculoskeletal:     Comments: Normal Muscle Bulk and Muscle Testing Reveals:  Upper Extremities: Full ROM and Muscle Strength 5/5 Bilateral AC Joint Tenderness  Lumbar Hypersensitivity Lower Extremities: Right: Decreased ROM and Muscle Strength 5/5 Right Lower Extremity Flexion Produces Pain into his Lumbar  Left Lower Extremity : Full ROM and Muscle Strength 5/5 Arises from Table with ease Narrow Based  Gait     Skin:    General: Skin is warm and dry.  Neurological:      Mental Status: She is alert and oriented to person, place, and time.  Psychiatric:        Mood and Affect: Mood normal.  Behavior: Behavior normal.   Stable 9/25       Assessment & Plan:  Cervicalgia: Continue HEP as Tolerated. Continue to alternate Heat and Ice Therapy. Continue to Monitor. 10/23/2023 Left Shoulder Tendonitis: No Complaints today. Continue to Alternate Ice and Heat Therapy. Continue HEP as Tolerated. Continue to Monitor. 10/23/2023 Chronic Bilateral Thoracic Back Pain: Continue HEP as Tolerated. Continue current medication regimen. Continue to Monitor. 10/23/2023 Lumbosacral Spondylosis without Myelopathy : S/P Right lumbar L2, L3, L4 medial branch blocks under fluoroscopic guidance: on 03/21/2021 .Continue to Monitor. 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 A 12 month History has been reviewed on the [place]  Controlled Substance Reporting System on 10/23/2023 Discussed risks and benefits of celebrex Discussed risks and benefits of medial branch blocks, referred to Dr. Lucius for repeat medial branch block right L3/L4 D/c xtampza  since not helping Increase percocet to 7.5mg  q4H prn since this is the only thing that helps Encouraged walking outside 6.   Discussed that she wants to do aquatherapy   Myofacial Pain Syndrome:  Continue Flexeril . Discussed that trigger points are no longer helpful  F/U with Dr Lorilee

## 2024-02-03 NOTE — Progress Notes (Unsigned)
  PROCEDURE RECORD [facility]   Name: Karen Whitehead DOB:1965/06/23 MRN: [id number]  Date:02/03/2024  Physician: Prentice Compton MD    Nurse/CMA: Tye Kitty MA  Allergies: No Known Allergies  Consent Signed: {yes no:314532}  Is patient diabetic? {yes no:314532}  CBG today? ***  Pregnant: {yes no:314532} LMP: No LMP recorded. (Menstrual status: Perimenopausal). (age [age])  Anticoagulants: {Yes/No:19989} Anti-inflammatory: {Yes/No:19989} Antibiotics: {Yes/No:19989}  Procedure: Medial Branch Blocks  Position: Prone Start Time: ***  End Time: ***  Fluoro Time: ***  RN/CMA      Time      BP      Pulse      Respirations      O2 Sat      S/S      Pain Level       D/C home with ***, patient A & O X 3, D/C instructions reviewed, and sits independently.

## 2024-02-05 ENCOUNTER — Encounter: Attending: Physical Medicine and Rehabilitation | Admitting: Physical Medicine & Rehabilitation

## 2024-02-05 ENCOUNTER — Encounter: Payer: Self-pay | Admitting: Physical Medicine & Rehabilitation

## 2024-02-05 VITALS — BP 126/81 | HR 82 | Ht 64.0 in | Wt 185.0 lb

## 2024-02-05 DIAGNOSIS — M7918 Myalgia, other site: Secondary | ICD-10-CM | POA: Diagnosis present

## 2024-02-05 MED ORDER — LIDOCAINE HCL 1 % IJ SOLN
5.0000 mL | Freq: Once | INTRAMUSCULAR | Status: AC
  Administered 2024-02-05: 5 mL

## 2024-02-05 NOTE — Progress Notes (Signed)
 Trigger Point Injection  Indication: Right L5-S1  Myofascial pain not relieved by medication management and other conservative care.  Informed consent was obtained after describing risk and benefits of the procedure with the patient, this includes bleeding, bruising, infection and medication side effects.  The patient wishes to proceed and has given written consent.  The patient was placed in a prone position.  The Right L5-S1 area wasimaged with fluoro marked and prepped with Betadine.  It was entered with a 25-gauge 1-1/2 inch needle and 1 mL of 1% lidocaine  was injected into each of 1 trigger points, after negative draw back for blood.  The patient tolerated the procedure well.  Post procedure instructions were given.   Original plan was for MBB but pt states she would not tolerate a deeper injection with a larger needle

## 2024-02-15 ENCOUNTER — Other Ambulatory Visit (HOSPITAL_BASED_OUTPATIENT_CLINIC_OR_DEPARTMENT_OTHER): Payer: Self-pay

## 2024-02-15 ENCOUNTER — Other Ambulatory Visit (HOSPITAL_COMMUNITY): Payer: Self-pay

## 2024-03-15 ENCOUNTER — Other Ambulatory Visit: Payer: Self-pay

## 2024-03-15 ENCOUNTER — Encounter: Attending: Physical Medicine and Rehabilitation | Admitting: Physical Medicine and Rehabilitation

## 2024-03-15 ENCOUNTER — Other Ambulatory Visit (HOSPITAL_COMMUNITY): Payer: Self-pay

## 2024-03-15 VITALS — BP 117/81 | HR 81 | Ht 64.0 in | Wt 181.0 lb

## 2024-03-15 DIAGNOSIS — M7918 Myalgia, other site: Secondary | ICD-10-CM | POA: Diagnosis not present

## 2024-03-15 MED ORDER — OXYCODONE-ACETAMINOPHEN 7.5-325 MG PO TABS
1.0000 | ORAL_TABLET | ORAL | 0 refills | Status: DC | PRN
  Filled 2024-03-15 – 2024-03-16 (×3): qty 120, 20d supply, fill #0

## 2024-03-15 MED ORDER — LIDOCAINE HCL 1 % IJ SOLN: 5.0000 mL | Freq: Once | INTRAMUSCULAR | Status: AC

## 2024-03-15 MED ORDER — XTAMPZA ER 13.5 MG PO C12A
1.0000 | EXTENDED_RELEASE_CAPSULE | Freq: Two times a day (BID) | ORAL | 0 refills | Status: DC
  Filled 2024-03-15 – 2024-03-16 (×3): qty 60, 30d supply, fill #0

## 2024-03-15 NOTE — Progress Notes (Signed)

## 2024-03-16 ENCOUNTER — Other Ambulatory Visit (HOSPITAL_COMMUNITY): Payer: Self-pay

## 2024-03-16 ENCOUNTER — Other Ambulatory Visit: Payer: Self-pay | Admitting: Family Medicine

## 2024-03-16 ENCOUNTER — Other Ambulatory Visit: Payer: Self-pay

## 2024-03-16 MED ORDER — ALBUTEROL SULFATE HFA 108 (90 BASE) MCG/ACT IN AERS
1.0000 | INHALATION_SPRAY | Freq: Four times a day (QID) | RESPIRATORY_TRACT | 2 refills | Status: DC | PRN
  Filled 2024-03-16: qty 6.7, 25d supply, fill #0
  Filled 2024-05-08: qty 6.7, 25d supply, fill #1

## 2024-03-17 ENCOUNTER — Other Ambulatory Visit: Payer: Self-pay

## 2024-03-19 ENCOUNTER — Encounter: Payer: Self-pay | Admitting: Family Medicine

## 2024-03-21 ENCOUNTER — Other Ambulatory Visit: Payer: Self-pay | Admitting: Family Medicine

## 2024-03-21 ENCOUNTER — Other Ambulatory Visit (HOSPITAL_COMMUNITY): Payer: Self-pay

## 2024-03-21 ENCOUNTER — Other Ambulatory Visit: Payer: Self-pay

## 2024-03-21 MED ORDER — SEMAGLUTIDE (1 MG/DOSE) 4 MG/3ML ~~LOC~~ SOPN
1.0000 mg | PEN_INJECTOR | SUBCUTANEOUS | 6 refills | Status: AC
  Filled 2024-03-21 (×2): qty 3, 28d supply, fill #0
  Filled 2024-05-04: qty 3, 28d supply, fill #1

## 2024-04-04 ENCOUNTER — Encounter: Attending: Physical Medicine and Rehabilitation | Admitting: Physical Medicine and Rehabilitation

## 2024-04-04 ENCOUNTER — Encounter: Payer: Self-pay | Admitting: Physical Medicine and Rehabilitation

## 2024-04-04 VITALS — BP 160/98 | HR 88 | Ht 64.0 in | Wt 179.4 lb

## 2024-04-04 DIAGNOSIS — M47816 Spondylosis without myelopathy or radiculopathy, lumbar region: Secondary | ICD-10-CM | POA: Insufficient documentation

## 2024-04-04 MED ORDER — LIDOCAINE HCL 1 % IJ SOLN
3.0000 mL | Freq: Once | INTRAMUSCULAR | Status: AC
  Administered 2024-04-04: 3 mL

## 2024-04-04 MED ORDER — OXYCODONE-ACETAMINOPHEN 7.5-325 MG PO TABS: 1.0000 | ORAL_TABLET | ORAL | 0 refills | Status: DC | PRN

## 2024-04-04 MED ORDER — XTAMPZA ER 13.5 MG PO C12A: 1.0000 | EXTENDED_RELEASE_CAPSULE | Freq: Two times a day (BID) | ORAL | 0 refills | Status: AC

## 2024-04-04 NOTE — Addendum Note (Signed)
 Addended by: Ahtziry Saathoff W on: 04/04/2024 04:05 PM   Modules accepted: Orders

## 2024-04-04 NOTE — Progress Notes (Signed)
Trigger Point Injection  Indication: Lumbar myofascial pain not relieved by medication management and other conservative care.  Informed consent was obtained after describing risk and benefits of the procedure with the patient, this includes bleeding, bruising, infection and medication side effects.  The patient wishes to proceed and has given written consent.  The patient was placed in a seated position.  The area of pain was marked and prepped with Betadine.  It was entered with a 25-gauge 1/2 inch needle and a total of 5 mL of 1% lidocaine and normal saline was injected into a total of 4 trigger points, after negative draw back for blood.  The patient tolerated the procedure well.  Post procedure instructions were given.     

## 2024-05-03 ENCOUNTER — Encounter (HOSPITAL_BASED_OUTPATIENT_CLINIC_OR_DEPARTMENT_OTHER): Payer: Self-pay | Admitting: Emergency Medicine

## 2024-05-03 ENCOUNTER — Other Ambulatory Visit: Payer: Self-pay

## 2024-05-03 ENCOUNTER — Emergency Department (HOSPITAL_BASED_OUTPATIENT_CLINIC_OR_DEPARTMENT_OTHER)

## 2024-05-03 ENCOUNTER — Emergency Department (HOSPITAL_BASED_OUTPATIENT_CLINIC_OR_DEPARTMENT_OTHER): Admission: EM | Admit: 2024-05-03 | Discharge: 2024-05-03 | Disposition: A | Discharge status: Home / Self Care

## 2024-05-03 DIAGNOSIS — E1122 Type 2 diabetes mellitus with diabetic chronic kidney disease: Secondary | ICD-10-CM | POA: Insufficient documentation

## 2024-05-03 DIAGNOSIS — E876 Hypokalemia: Secondary | ICD-10-CM | POA: Diagnosis not present

## 2024-05-03 DIAGNOSIS — D72829 Elevated white blood cell count, unspecified: Secondary | ICD-10-CM | POA: Insufficient documentation

## 2024-05-03 DIAGNOSIS — H60502 Unspecified acute noninfective otitis externa, left ear: Secondary | ICD-10-CM | POA: Insufficient documentation

## 2024-05-03 DIAGNOSIS — I129 Hypertensive chronic kidney disease with stage 1 through stage 4 chronic kidney disease, or unspecified chronic kidney disease: Secondary | ICD-10-CM | POA: Diagnosis not present

## 2024-05-03 DIAGNOSIS — R220 Localized swelling, mass and lump, head: Secondary | ICD-10-CM | POA: Diagnosis present

## 2024-05-03 DIAGNOSIS — R590 Localized enlarged lymph nodes: Secondary | ICD-10-CM | POA: Insufficient documentation

## 2024-05-03 DIAGNOSIS — Z79899 Other long term (current) drug therapy: Secondary | ICD-10-CM | POA: Insufficient documentation

## 2024-05-03 DIAGNOSIS — N189 Chronic kidney disease, unspecified: Secondary | ICD-10-CM | POA: Insufficient documentation

## 2024-05-03 DIAGNOSIS — L03211 Cellulitis of face: Secondary | ICD-10-CM

## 2024-05-03 DIAGNOSIS — H6012 Cellulitis of left external ear: Secondary | ICD-10-CM | POA: Diagnosis not present

## 2024-05-03 LAB — CBC WITH DIFFERENTIAL/PLATELET
Abs Immature Granulocytes: 0.04 K/uL (ref 0.00–0.07)
Basophils Absolute: 0.1 K/uL (ref 0.0–0.1)
Basophils Relative: 0 %
Eosinophils Absolute: 0.3 K/uL (ref 0.0–0.5)
Eosinophils Relative: 2 %
HCT: 37.2 % (ref 36.0–46.0)
Hemoglobin: 13.1 g/dL (ref 12.0–15.0)
Immature Granulocytes: 0 %
Lymphocytes Relative: 22 %
Lymphs Abs: 2.5 K/uL (ref 0.7–4.0)
MCH: 31.5 pg (ref 26.0–34.0)
MCHC: 35.2 g/dL (ref 30.0–36.0)
MCV: 89.4 fL (ref 80.0–100.0)
Monocytes Absolute: 0.7 K/uL (ref 0.1–1.0)
Monocytes Relative: 6 %
Neutro Abs: 7.9 K/uL — ABNORMAL HIGH (ref 1.7–7.7)
Neutrophils Relative %: 70 %
Platelets: 242 K/uL (ref 150–400)
RBC: 4.16 MIL/uL (ref 3.87–5.11)
RDW: 12.7 % (ref 11.5–15.5)
WBC: 11.5 K/uL — ABNORMAL HIGH (ref 4.0–10.5)
nRBC: 0 % (ref 0.0–0.2)

## 2024-05-03 LAB — BASIC METABOLIC PANEL WITH GFR
Anion gap: 9 (ref 5–15)
BUN: 8 mg/dL (ref 6–20)
CO2: 30 mmol/L (ref 22–32)
Calcium: 9.1 mg/dL (ref 8.9–10.3)
Chloride: 100 mmol/L (ref 98–111)
Creatinine, Ser: 0.75 mg/dL (ref 0.44–1.00)
GFR, Estimated: >60 mL/min
Glucose, Bld: 92 mg/dL (ref 70–99)
Potassium: 3.3 mmol/L — ABNORMAL LOW (ref 3.5–5.1)
Sodium: 140 mmol/L (ref 135–145)

## 2024-05-03 MED ORDER — POTASSIUM CHLORIDE CRYS ER 20 MEQ PO TBCR
40.0000 meq | EXTENDED_RELEASE_TABLET | Freq: Once | ORAL | Status: AC
  Administered 2024-05-03: 40 meq via ORAL
  Filled 2024-05-03: qty 2

## 2024-05-03 MED ORDER — LEVOFLOXACIN 500 MG PO TABS: 500.0000 mg | ORAL_TABLET | Freq: Every day | ORAL | 0 refills | Status: AC

## 2024-05-03 MED ORDER — MORPHINE SULFATE (PF) 4 MG/ML IV SOLN
4.0000 mg | Freq: Once | INTRAVENOUS | Status: AC
  Administered 2024-05-03: 4 mg via INTRAVENOUS
  Filled 2024-05-03: qty 1

## 2024-05-03 MED ORDER — LEVOFLOXACIN 750 MG PO TABS
750.0000 mg | ORAL_TABLET | Freq: Once | ORAL | Status: AC
  Administered 2024-05-03: 750 mg via ORAL
  Filled 2024-05-03: qty 1

## 2024-05-03 MED ORDER — IOHEXOL 300 MG/ML  SOLN
75.0000 mL | Freq: Once | INTRAMUSCULAR | Status: AC | PRN
  Administered 2024-05-03: 75 mL via INTRAVENOUS

## 2024-05-03 NOTE — ED Triage Notes (Signed)
 Pt c/o L ear fullness, swelling from L ear down L neck x 3 days.  Also reports epistaxis for same period.   Recent travel to [place]. Recent exposure to Covid. Denies URI sx.   Took oxycodone  ER appx 0930.
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
## 2024-05-03 NOTE — Discharge Instructions (Addendum)
 As we discussed, your workup in the ER today revealed that you have an infection in your ear canal that has spread into the front of your ear as well.  We have given you a dose of oral antibiotics here and I have given you a prescription for Levaquin  that you need to take as prescribed in its entirety for management of the symptoms.  I have given you a referral to an ear nose and throat specialist with a number to call to schedule an appointment for follow-up.  Please do so at your earliest convenience.  In the interim, I recommend that you take Tylenol /ibuprofen as needed for pain as well as your already prescribed oxycodone .  Additionally, your CT scan did pick up some swollen lymph nodes in your neck that are likely reacting to the infection in your ear.  However it is recommended that you have an ultrasound in 6 months to ensure that this swelling has resolved.  You can do this through your PCP, please call to schedule an appointment to further discuss this.  Return if development of any new or worsening symptoms.

## 2024-05-03 NOTE — ED Notes (Signed)
 ED Provider at bedside.

## 2024-05-04 ENCOUNTER — Telehealth (INDEPENDENT_AMBULATORY_CARE_PROVIDER_SITE_OTHER): Payer: Self-pay

## 2024-05-04 ENCOUNTER — Other Ambulatory Visit (HOSPITAL_COMMUNITY): Payer: Self-pay

## 2024-05-04 NOTE — Telephone Encounter (Signed)
 I returned a call to patient after she left a message today at 12:04pm. She stated she was seen at ED for her ear. They gave her Rx that had made her have Nausea and vomiting. Could we call her something else in? I looked at her chart, we have her scheduled for consult on 06/08/24, she has never been seen in our office.I spoke with patient to inform her that since we have not seen her in office yet, we cannot advise her. She needs to call her PCP or go to the ED.  Patient understood.

## 2024-05-08 ENCOUNTER — Other Ambulatory Visit (HOSPITAL_COMMUNITY): Payer: Self-pay

## 2024-05-10 ENCOUNTER — Telehealth: Payer: Self-pay

## 2024-05-10 ENCOUNTER — Ambulatory Visit (HOSPITAL_BASED_OUTPATIENT_CLINIC_OR_DEPARTMENT_OTHER)

## 2024-05-10 VITALS — Ht 64.0 in | Wt 184.0 lb

## 2024-05-10 DIAGNOSIS — Z1231 Encounter for screening mammogram for malignant neoplasm of breast: Secondary | ICD-10-CM

## 2024-05-10 DIAGNOSIS — E1165 Type 2 diabetes mellitus with hyperglycemia: Secondary | ICD-10-CM

## 2024-05-10 DIAGNOSIS — Z0000 Encounter for general adult medical examination without abnormal findings: Secondary | ICD-10-CM

## 2024-05-10 NOTE — Patient Instructions (Signed)
 Karen Whitehead,  Thank you for taking the time for your Medicare Wellness Visit. I appreciate your continued commitment to your health goals. Please review the care plan we discussed, and feel free to reach out if I can assist you further.  Please note that Annual Wellness Visits do not include a physical exam. Some assessments may be limited, especially if the visit was conducted virtually. If needed, we may recommend an in-person follow-up with your provider.  Ongoing Care Seeing your primary care provider every 3 to 6 months helps us  monitor your health and provide consistent, personalized care.   Referrals If a referral was made during today's visit and you haven't received any updates within two weeks, please contact the referred provider directly to check on the status.  Recommended Screenings:  Health Maintenance  Topic Date Due   Medicare Annual Wellness Visit  Never done   COVID-19 Vaccine (1) Never done   Eye exam for diabetics  Never done   Hepatitis B Vaccine (1 of 3 - 19+ 3-dose series) Never done   Zoster (Shingles) Vaccine (1 of 2) Never done   Breast Cancer Screening  02/15/2023   Flu Shot  08/02/2024*   Pneumococcal Vaccine for age over 68 (1 of 2 - PCV) 01/06/2025*   Complete foot exam   07/06/2024   Hemoglobin A1C  07/06/2024   Yearly kidney health urinalysis for diabetes  01/06/2025   Yearly kidney function blood test for diabetes  05/03/2025   Pap with HPV screening  05/29/2027   Colon Cancer Screening  05/20/2028   Hepatitis C Screening  Completed   HIV Screening  Completed   HPV Vaccine  Aged Out   Meningitis B Vaccine  Aged Out   DTaP/Tdap/Td vaccine  Discontinued  *Topic was postponed. The date shown is not the original due date.       05/10/2024   10:34 AM  Advanced Directives  Does Patient Have a Medical Advance Directive? No  Would patient like information on creating a medical advance directive? No - Patient declined    Vision: Annual vision  screenings are recommended for early detection of glaucoma, cataracts, and diabetic retinopathy. These exams can also reveal signs of chronic conditions such as diabetes and high blood pressure.  Dental: Annual dental screenings help detect early signs of oral cancer, gum disease, and other conditions linked to overall health, including heart disease and diabetes.  Please see the attached documents for additional preventive care recommendations.

## 2024-05-10 NOTE — Telephone Encounter (Signed)
 Routing to PCP for review.

## 2024-05-10 NOTE — Telephone Encounter (Signed)
 Patient stated during her AWV, that she was at the ER on 05/03/2024 due to left ear pain/infection.  Patient stated that she still can not hear out that ear and that she would prefer an antibiotic ear drop vs pills.  Patient has made an appointment to see ENT.  Patient can not be seen until 06/08/2024.  Patient states that the pain is unbearable .  Also, patient would like to request a refill on her inhaler and a glucometer with strips.  Patient was using OneTouch Verio and it stopped working.  Please send rx to [facility] (via delivery).  Lu Paradise N. Tomie, LPN [facility] Annual Wellness Team Direct Dial: [phone]

## 2024-05-10 NOTE — Progress Notes (Signed)
 "  Chief Complaint  Patient presents with   Medicare Wellness    INITIAL      Subjective:   Karen Whitehead is a 59 y.o. female who presents for a Medicare Annual Wellness Visit.  Visit info / Clinical Intake: Medicare Wellness Visit Type:: Initial Annual Wellness Visit Persons participating in visit and providing information:: patient Medicare Wellness Visit Mode:: Video Since this visit was completed virtually, some vitals may be partially provided or unavailable. Missing vitals are due to the limitations of the virtual format.: Documented vitals are patient reported If Telephone or Video please confirm:: I connected with patient using audio/video enable telemedicine. I verified patient identity with two identifiers, discussed telehealth limitations, and patient agreed to proceed. Patient Location:: HOME Provider Location:: HOME OFFICE Interpreter Needed?: No Pre-visit prep was completed: yes AWV questionnaire completed by patient prior to visit?: yes Date:: 05/10/24 Living arrangements:: (!) lives alone Patient's Overall Health Status Rating: (!) poor Typical amount of pain: (!) a lot Does pain affect daily life?: (!) yes Are you currently prescribed opioids?: (!) yes  Dietary Habits and Nutritional Risks How many meals a day?: (!) 1 Eats fruit and vegetables daily?: yes Most meals are obtained by: preparing own meals Diabetic:: (!) yes Any non-healing wounds?: no How often do you check your BS?: as needed (NEEDS A NEW GLUCOMETER) Would you like to be referred to a Nutritionist or for Diabetic Management? : no  Functional Status Activities of Daily Living (to include ambulation/medication): (!) Needs Assist Feeding: Independent Dressing/Grooming: Independent Bathing: Independent Toileting: Independent Transfer: Independent Ambulation: Independent with device- listed below Home Assistive Devices/Equipment: Cane; Eyeglasses Medication Administration: Independent Home  Management (perform basic housework or laundry): Needs assistance (comment) (BACK PAIN, NOT ABLE TO STAND LONG) Manage your own finances?: yes Primary transportation is: family / friends Concerns about vision?: no *vision screening is required for WTM* Concerns about hearing?: (!) yes (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) Uses hearing aids?: no  Fall Screening Falls in the past year?: 1 Number of falls in past year: 1 Was there an injury with Fall?: 0 Fall Risk Category Calculator: 2 Patient Fall Risk Level: Moderate Fall Risk  Fall Risk Patient at Risk for Falls Due to: Impaired balance/gait Fall risk Follow up: Falls evaluation completed; Education provided  Home and Transportation Safety: All rugs have non-skid backing?: N/A, no rugs All stairs or steps have railings?: N/A, no stairs Grab bars in the bathtub or shower?: yes Have non-skid surface in bathtub or shower?: yes Good home lighting?: yes Regular seat belt use?: yes Hospital stays in the last year:: no  Cognitive Assessment Difficulty concentrating, remembering, or making decisions? : no (TROUBLE WITH FOCUSING) Will 6CIT or Mini Cog be Completed: yes What year is it?: 0 points What month is it?: 0 points Give patient an address phrase to remember (5 components): [place] [address] About what time is it?: 0 points Count backwards from 20 to 1: 0 points Say the months of the year in reverse: 0 points Repeat the address phrase from earlier: 0 points 6 CIT Score: 0 points  Advance Directives (For Healthcare) Does Patient Have a Medical Advance Directive?: No Would patient like information on creating a medical advance directive?: No - Patient declined  Reviewed/Updated  Reviewed/Updated: Reviewed All (Medical, Surgical, Family, Medications, Allergies, Care Teams, Patient Goals)    Allergies (verified) Patient has no known allergies.   Current Medications (verified) Outpatient Encounter Medications  as of 05/10/2024  Medication  Sig   albuterol  (VENTOLIN  HFA) 108 (90 Base) MCG/ACT inhaler INHALE 1-2 PUFFS INTO THE LUNGS EVERY 6 (SIX) HOURS AS NEEDED FOR WHEEZING OR SHORTNESS OF BREATH.   amitriptyline  (ELAVIL ) 10 MG tablet Take 1 tablet (10 mg total) by mouth at bedtime.   amLODipine  (NORVASC ) 5 MG tablet Take 1 tablet (5 mg total) by mouth daily.   atorvastatin  (LIPITOR) 80 MG tablet Take 1 tablet (80 mg total) by mouth daily.   Blood Glucose Monitoring Suppl (BLOOD GLUCOSE MONITOR SYSTEM) w/Device KIT Use to check blood sugar in the morning and at bedtime.   Blood Glucose Monitoring Suppl (ONETOUCH VERIO) w/Device KIT Use to check blood sugar in the morning and at bedtime   clonazePAM  (KLONOPIN ) 0.5 MG tablet Take 1 tablet (0.5 mg total) by mouth 2 (two) times daily as needed. for anxiety   cyclobenzaprine  (FLEXERIL ) 10 MG tablet Take 1 tablet (10 mg total) by mouth 2 (two) times daily as needed for muscle spasms as needed for muscle pain   divalproex  (DEPAKOTE  ER) 500 MG 24 hr tablet Take 1 tablet (500 mg total) by mouth at bedtime.   fluticasone  (CUTIVATE ) 0.005 % ointment Apply topically 2 times daily.   gabapentin  (NEURONTIN ) 300 MG capsule Take 1 capsule (300 mg total) by mouth 3 (three) times daily.   glucose blood (ONETOUCH VERIO) test strip Use to check blood sugar in the morning and at bedtime.   ibuprofen (ADVIL) 200 MG tablet Take 200 mg by mouth every 6 (six) hours as needed for headache, mild pain or moderate pain.   Lancets (ONETOUCH DELICA PLUS LANCET33G) MISC Use to check blood sugar in the morning and at bedtime.   levofloxacin  (LEVAQUIN ) 500 MG tablet Take 1 tablet (500 mg total) by mouth daily.   lidocaine  (LIDODERM ) 5 % Place 2 patches onto the skin daily. Remove & Discard patch within 12 hours or as directed by MD   nicotine  (NICODERM CQ ) 7 mg/24hr patch Place 1 patch (7 mg total) onto the skin daily.   ondansetron  (ZOFRAN -ODT) 4 MG disintegrating tablet Take 1 tablet (4  mg total) by mouth every 8 (eight) hours as needed for nausea or vomiting.   oxyCODONE  ER (XTAMPZA  ER) 13.5 MG C12A Take 1 capsule by mouth 2 (two) times daily.   oxyCODONE -acetaminophen  (PERCOCET) 7.5-325 MG tablet Take 1 tablet by mouth every 4 (four) hours as needed for severe pain (pain score 7-10).   risperiDONE  (RISPERDAL ) 1 MG tablet Take 1 tablet (1 mg total) by mouth 2 (two) times daily.   Semaglutide , 1 MG/DOSE, 4 MG/3ML SOPN Inject 1 mg as directed once a week.   sertraline  (ZOLOFT ) 100 MG tablet Take 2 tablets (200 mg total) by mouth daily.   SUMAtriptan  (IMITREX ) 50 MG tablet Take 1 tablet (50 mg total) by mouth every 2 (two) hours as needed for migraine. May repeat in 2 hours if headache persists or recurs.   Suzetrigine  (JOURNAVX ) 50 MG TABS Take 1 tablet by mouth 2 (two) times daily as needed.   topiramate  (TOPAMAX ) 50 MG tablet Take 1 tablet (50 mg total) by mouth 2 (two) times daily.   traZODone  (DESYREL ) 100 MG tablet Take 2 tablets (200 mg total) by mouth at bedtime.   varenicline  (CHANTIX  CONTINUING MONTH PAK) 1 MG tablet Take 1 tablet (1 mg total) by mouth 2 (two) times daily. After completion of starter pack   Vitamin D , Ergocalciferol , (DRISDOL ) 1.25 MG (50000 UNIT) CAPS capsule Take 1 capsule (50,000 Units total) by  mouth every 7 (seven) days.   [DISCONTINUED] potassium chloride  (K-DUR) 10 MEQ tablet Take 1 tablet (10 mEq total) by mouth daily. (Patient not taking: Reported on 02/19/2016)   Facility-Administered Encounter Medications as of 05/10/2024  Medication   lidocaine  (XYLOCAINE ) 1 % (with pres) injection 3 mL   lidocaine  (XYLOCAINE ) 1 % (with pres) injection 3 mL   lidocaine  (XYLOCAINE ) 1 % (with pres) injection 5 mL   lidocaine  (XYLOCAINE ) 1 % (with pres) injection 5 mL   sodium chloride  (PF) 0.9 % injection 2 mL   sodium chloride  (PF) 0.9 % injection 2 mL   sodium chloride  (PF) 0.9 % injection 2 mL    History: Past Medical History:  Diagnosis Date   Anxiety     Arthritis    Chronic kidney disease    Depression    Diabetes mellitus without complication (HCC)    Granuloma annulare    Hyperlipidemia    Past Surgical History:  Procedure Laterality Date   APPENDECTOMY     Family History  Problem Relation Age of Onset   Stomach cancer Mother    Heart attack Father    Breast cancer Neg Hx    Colon cancer Neg Hx    Colon polyps Neg Hx    Social History   Occupational History   Not on file  Tobacco Use   Smoking status: Every Day    Current packs/day: 0.50    Types: Cigarettes   Smokeless tobacco: Never  Vaping Use   Vaping status: Never Used  Substance and Sexual Activity   Alcohol use: No   Drug use: No   Sexual activity: Not Currently   Tobacco Counseling Ready to quit: No Counseling given: No  SDOH Screenings   Food Insecurity: Food Insecurity Present (05/10/2024)  Housing: Low Risk (05/10/2024)  Transportation Needs: Unmet Transportation Needs (05/10/2024)  Utilities: Not At Risk (05/10/2024)  Alcohol Screen: Low Risk (05/10/2024)  Depression (PHQ2-9): Low Risk (05/10/2024)  Financial Resource Strain: High Risk (05/10/2024)  Physical Activity: Inactive (05/10/2024)  Social Connections: Socially Isolated (05/10/2024)  Stress: Stress Concern Present (05/10/2024)  Tobacco Use: High Risk (05/10/2024)  Health Literacy: Adequate Health Literacy (05/10/2024)   See flowsheets for full screening details  Depression Screen PHQ 2 & 9 Depression Scale- Over the past 2 weeks, how often have you been bothered by any of the following problems? Little interest or pleasure in doing things: 0 Feeling down, depressed, or hopeless (PHQ Adolescent also includes...irritable): 0 PHQ-2 Total Score: 0 Trouble falling or staying asleep, or sleeping too much: 0 Feeling tired or having little energy: 0 Poor appetite or overeating (PHQ Adolescent also includes...weight loss): 0 Feeling bad about yourself - or that you are a failure or have let yourself or your  family down: 0 Trouble concentrating on things, such as reading the newspaper or watching television (PHQ Adolescent also includes...like school work): 0 Moving or speaking so slowly that other people could have noticed. Or the opposite - being so fidgety or restless that you have been moving around a lot more than usual: 0 Thoughts that you would be better off dead, or of hurting yourself in some way: 0 PHQ-9 Total Score: 0 If you checked off any problems, how difficult have these problems made it for you to do your work, take care of things at home, or get along with other people?: Not difficult at all  Depression Treatment Depression Interventions/Treatment : EYV7-0 Score <4 Follow-up Not Indicated; Medication  Goals Addressed             This Visit's Progress    05/10/2024: Keep working on  my diet, start water aerobics and get off of diabetic medication.               Objective:    Today's Vitals   05/10/24 1035  Weight: 184 lb (83.5 kg)  Height: 5' 4 (1.626 m)  PainLoc: Ear   Body mass index is 31.58 kg/m.  Hearing/Vision screen Hearing Screening - Comments:: Left ear decreased hearing due to infection. Immunizations and Health Maintenance Health Maintenance  Topic Date Due   COVID-19 Vaccine (1) Never done   OPHTHALMOLOGY EXAM  Never done   Hepatitis B Vaccines 19-59 Average Risk (1 of 3 - 19+ 3-dose series) Never done   Zoster Vaccines- Shingrix (1 of 2) Never done   Mammogram  02/15/2023   Influenza Vaccine  08/02/2024 (Originally 12/04/2023)   Pneumococcal Vaccine: 50+ Years (1 of 2 - PCV) 01/06/2025 (Originally 05/07/1984)   FOOT EXAM  07/06/2024   HEMOGLOBIN A1C  07/06/2024   Diabetic kidney evaluation - Urine ACR  01/06/2025   Diabetic kidney evaluation - eGFR measurement  05/03/2025   Medicare Annual Wellness (AWV)  05/10/2025   Cervical Cancer Screening (HPV/Pap Cotest)  05/29/2027   Colonoscopy  05/20/2028   Hepatitis C Screening  Completed   HIV  Screening  Completed   HPV VACCINES  Aged Out   Meningococcal B Vaccine  Aged Out   DTaP/Tdap/Td  Discontinued        Assessment/Plan:  This is a routine wellness examination for Avalie.  Patient Care Team: Delbert Clam, MD as PCP - General (Family Medicine) [facility], [facility]  as Consulting Physician (Optometry) [place], Sven SQUIBB, MD as Consulting Physician (Physical Medicine and Rehabilitation)  I have personally reviewed and noted the following in the patients chart:   Medical and social history Use of alcohol, tobacco or illicit drugs  Current medications and supplements including opioid prescriptions. Functional ability and status Nutritional status Physical activity Advanced directives List of other physicians Hospitalizations, surgeries, and ER visits in previous 12 months Vitals Screenings to include cognitive, depression, and falls Referrals and appointments  No orders of the defined types were placed in this encounter.  In addition, I have reviewed and discussed with patient certain preventive protocols, quality metrics, and best practice recommendations. A written personalized care plan for preventive services as well as general preventive health recommendations were provided to patient.   Roz LOISE Fuller, LPN   12/06/7971   Return in about 1 year (around 05/10/2025) for Medicare wellness.  After Visit Summary: (MyChart) Due to this being a telephonic visit, the after visit summary with patients personalized plan was offered to patient via MyChart   Nurse Notes:  Separate telephone note sent for (refills on medications) HM Addressed: Mammogram ordered Diabetic Eye Exam was completed by [facility],  Records have been requested.  "

## 2024-05-11 ENCOUNTER — Other Ambulatory Visit: Payer: Self-pay

## 2024-05-11 MED ORDER — ALBUTEROL SULFATE HFA 108 (90 BASE) MCG/ACT IN AERS
1.0000 | INHALATION_SPRAY | Freq: Four times a day (QID) | RESPIRATORY_TRACT | 2 refills | Status: AC | PRN
  Filled 2024-05-11: qty 6.7, 25d supply, fill #0

## 2024-05-11 MED ORDER — BLOOD GLUCOSE MONITOR SYSTEM W/DEVICE KIT
1.0000 | PACK | Freq: Two times a day (BID) | 0 refills | Status: AC
  Filled 2024-05-11 – 2024-05-17 (×2): qty 1, 30d supply, fill #0

## 2024-05-11 MED ORDER — ONETOUCH VERIO VI STRP
ORAL_STRIP | 6 refills | Status: AC
  Filled 2024-05-11: qty 100, fill #0
  Filled 2024-05-17: qty 100, 50d supply, fill #0

## 2024-05-11 MED ORDER — ACCU-CHEK SOFTCLIX LANCETS MISC
6 refills | Status: AC
  Filled 2024-05-11: qty 100, fill #0
  Filled 2024-05-17: qty 100, 50d supply, fill #0

## 2024-05-11 MED ORDER — CIPROFLOXACIN-DEXAMETHASONE 0.3-0.1 % OT SUSP
4.0000 [drp] | Freq: Two times a day (BID) | OTIC | 0 refills | Status: AC
  Filled 2024-05-11 – 2024-05-17 (×2): qty 7.5, 19d supply, fill #0

## 2024-05-11 NOTE — Addendum Note (Signed)
 Addended by: Gyneth Hubka on: 05/11/2024 12:37 PM   Modules accepted: Orders

## 2024-05-11 NOTE — Telephone Encounter (Signed)
"  Prescriptions have been sent to the pharmacy  "

## 2024-05-12 ENCOUNTER — Other Ambulatory Visit: Payer: Self-pay

## 2024-05-12 NOTE — Telephone Encounter (Signed)
 Patient has been informed.

## 2024-05-17 ENCOUNTER — Other Ambulatory Visit: Payer: Self-pay

## 2024-05-19 ENCOUNTER — Encounter: Attending: Physical Medicine and Rehabilitation | Admitting: Physical Medicine and Rehabilitation

## 2024-05-19 ENCOUNTER — Other Ambulatory Visit: Payer: Self-pay

## 2024-05-19 ENCOUNTER — Other Ambulatory Visit (HOSPITAL_COMMUNITY): Payer: Self-pay

## 2024-05-19 ENCOUNTER — Encounter: Payer: Self-pay | Admitting: Physical Medicine and Rehabilitation

## 2024-05-19 VITALS — BP 118/80 | HR 73 | Ht 64.0 in | Wt 173.2 lb

## 2024-05-19 DIAGNOSIS — Z5181 Encounter for therapeutic drug level monitoring: Secondary | ICD-10-CM | POA: Diagnosis present

## 2024-05-19 DIAGNOSIS — G894 Chronic pain syndrome: Secondary | ICD-10-CM | POA: Insufficient documentation

## 2024-05-19 DIAGNOSIS — Z79899 Other long term (current) drug therapy: Secondary | ICD-10-CM | POA: Insufficient documentation

## 2024-05-19 DIAGNOSIS — M7918 Myalgia, other site: Secondary | ICD-10-CM | POA: Insufficient documentation

## 2024-05-19 MED ORDER — LIDOCAINE HCL 1 % IJ SOLN
3.0000 mL | Freq: Once | INTRAMUSCULAR | Status: AC
  Administered 2024-05-19: 3 mL

## 2024-05-19 MED ORDER — OXYCODONE-ACETAMINOPHEN 7.5-325 MG PO TABS
1.0000 | ORAL_TABLET | ORAL | 0 refills | Status: AC | PRN
  Filled 2024-05-19 (×2): qty 120, 20d supply, fill #0

## 2024-05-19 NOTE — Progress Notes (Signed)

## 2024-05-19 NOTE — Addendum Note (Signed)
 Addended by: LORILEE SVEN SQUIBB on: 05/19/2024 11:10 AM   Modules accepted: Orders

## 2024-05-24 LAB — DRUG TOX MONITOR 1 W/CONF, ORAL FLD
Amphetamines: NEGATIVE ng/mL
Barbiturates: NEGATIVE ng/mL
Benzodiazepines: NEGATIVE ng/mL
Buprenorphine: NEGATIVE ng/mL
Cocaine: NEGATIVE ng/mL
Codeine: NEGATIVE ng/mL
Cotinine: 200.4 ng/mL — ABNORMAL HIGH
Dihydrocodeine: NEGATIVE ng/mL
Fentanyl: NEGATIVE ng/mL
Heroin Metabolite: NEGATIVE ng/mL
Hydrocodone: NEGATIVE ng/mL
Hydromorphone: NEGATIVE ng/mL
MARIJUANA: NEGATIVE ng/mL
MDMA: NEGATIVE ng/mL
Meprobamate: NEGATIVE ng/mL
Methadone: NEGATIVE ng/mL
Morphine: NEGATIVE ng/mL
Nicotine Metabolite: POSITIVE ng/mL — AB
Norhydrocodone: NEGATIVE ng/mL
Noroxycodone: 26 ng/mL — ABNORMAL HIGH
Opiates: POSITIVE ng/mL — AB
Oxycodone: >250 ng/mL — ABNORMAL HIGH
Oxymorphone: NEGATIVE ng/mL
Phencyclidine: NEGATIVE ng/mL
Tapentadol: NEGATIVE ng/mL
Tramadol: NEGATIVE ng/mL
Zolpidem: NEGATIVE ng/mL

## 2024-05-24 LAB — DRUG TOX ALC METAB W/CON, ORAL FLD: Alcohol Metabolite: NEGATIVE ng/mL

## 2024-06-02 ENCOUNTER — Encounter: Admitting: Physical Medicine and Rehabilitation

## 2024-06-08 ENCOUNTER — Institutional Professional Consult (permissible substitution) (INDEPENDENT_AMBULATORY_CARE_PROVIDER_SITE_OTHER)

## 2024-06-15 ENCOUNTER — Institutional Professional Consult (permissible substitution) (INDEPENDENT_AMBULATORY_CARE_PROVIDER_SITE_OTHER)

## 2024-06-16 ENCOUNTER — Encounter: Admitting: Physical Medicine and Rehabilitation

## 2024-07-06 ENCOUNTER — Ambulatory Visit: Admitting: Family Medicine

## 2025-05-16 ENCOUNTER — Ambulatory Visit: Payer: Self-pay
